# Patient Record
Sex: Female | Born: 1995 | Race: Black or African American | Hispanic: No | Smoking: Current every day smoker
Health system: Southern US, Community
[De-identification: ages and names within clinical notes are randomized; demographics above are authoritative.]

## PROBLEM LIST (undated history)

## (undated) ENCOUNTER — Inpatient Hospital Stay (HOSPITAL_COMMUNITY): Payer: Self-pay

## (undated) DIAGNOSIS — N390 Urinary tract infection, site not specified: Secondary | ICD-10-CM

## (undated) DIAGNOSIS — A599 Trichomoniasis, unspecified: Secondary | ICD-10-CM

## (undated) DIAGNOSIS — A749 Chlamydial infection, unspecified: Secondary | ICD-10-CM

## (undated) HISTORY — PX: DRUG INDUCED ENDOSCOPY: SHX6808

## (undated) HISTORY — PX: NO PAST SURGERIES: SHX2092

## (undated) NOTE — *Deleted (*Deleted)
Patient reports an episode of vaginal bleeding this morning with a small clot in it, states she has not had any other bleeding in the pregnancy.  Denies current abdominal pain though states she had intense pain last week, no bleeding associated with that pain at the time.

---
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
## 2015-06-24 ENCOUNTER — Emergency Department (HOSPITAL_COMMUNITY)
Admission: EM | Admit: 2015-06-24 | Discharge: 2015-06-24 | Disposition: A | Discharge status: Home / Self Care | Payer: Medicaid Other | Attending: Emergency Medicine | Admitting: Emergency Medicine

## 2015-06-24 ENCOUNTER — Emergency Department (HOSPITAL_COMMUNITY)
Admission: EM | Admit: 2015-06-24 | Discharge: 2015-06-24 | Discharge status: Absent without leave | Payer: Medicaid Other | Source: Home / Self Care

## 2015-06-24 ENCOUNTER — Encounter (HOSPITAL_COMMUNITY): Payer: Self-pay | Admitting: Emergency Medicine

## 2015-06-24 DIAGNOSIS — Z87440 Personal history of urinary (tract) infections: Secondary | ICD-10-CM | POA: Diagnosis not present

## 2015-06-24 DIAGNOSIS — R0781 Pleurodynia: Secondary | ICD-10-CM

## 2015-06-24 DIAGNOSIS — Z8619 Personal history of other infectious and parasitic diseases: Secondary | ICD-10-CM | POA: Diagnosis not present

## 2015-06-24 DIAGNOSIS — J069 Acute upper respiratory infection, unspecified: Secondary | ICD-10-CM | POA: Diagnosis not present

## 2015-06-24 DIAGNOSIS — M791 Myalgia, unspecified site: Secondary | ICD-10-CM

## 2015-06-24 DIAGNOSIS — B9789 Other viral agents as the cause of diseases classified elsewhere: Secondary | ICD-10-CM

## 2015-06-24 DIAGNOSIS — R079 Chest pain, unspecified: Secondary | ICD-10-CM | POA: Diagnosis present

## 2015-06-24 MED ORDER — ALBUTEROL SULFATE HFA 108 (90 BASE) MCG/ACT IN AERS
4.0000 | INHALATION_SPRAY | Freq: Once | RESPIRATORY_TRACT | Status: AC
  Administered 2015-06-24: 4 via RESPIRATORY_TRACT
  Filled 2015-06-24: qty 6.7

## 2015-06-24 MED ORDER — SODIUM CHLORIDE 0.9 % IV BOLUS (SEPSIS): 1000.0000 mL | Freq: Once | INTRAVENOUS | Status: DC

## 2015-06-24 MED ORDER — IBUPROFEN 400 MG PO TABS
600.0000 mg | ORAL_TABLET | Freq: Once | ORAL | Status: AC
  Administered 2015-06-24: 600 mg via ORAL
  Filled 2015-06-24 (×2): qty 1

## 2015-06-24 NOTE — ED Provider Notes (Signed)
CSN: [id number]     Arrival date & time 06/24/15  1335 History   None    Chief Complaint  Patient presents with  . Nasal Congestion  . Chest Pain  . Weakness   (Consider location/radiation/quality/duration/timing/severity/associated sxs/prior Treatment) Patient is a 19 y.o. female presenting with URI. The history is provided by the patient. No language interpreter was used.  URI Presenting symptoms: congestion, cough, fatigue and rhinorrhea   Presenting symptoms: no fever and no sore throat   Severity:  Mild Onset quality:  Gradual Timing:  Constant Progression:  Worsening Chronicity:  New Relieved by:  Nothing Worsened by:  Nothing tried Ineffective treatments:  None tried Associated symptoms: myalgias and wheezing   Associated symptoms: no arthralgias, no headaches and no neck pain   Risk factors: not elderly, no chronic cardiac disease, no chronic kidney disease, no chronic respiratory disease, no diabetes mellitus, no recent illness, no recent travel and no sick contacts     Past Medical History  Diagnosis Date  . Trichomonas 01/2012  . Chlamydia infection   . UTI (lower urinary tract infection)    Past Surgical History  Procedure Laterality Date  . No past surgeries     Family History  Problem Relation Age of Onset  . Alcohol abuse Neg Hx   . Arthritis Neg Hx   . Asthma Neg Hx   . Birth defects Neg Hx   . Cancer Neg Hx   . COPD Neg Hx   . Depression Neg Hx   . Drug abuse Neg Hx   . Early death Neg Hx   . Hearing loss Neg Hx   . Heart disease Neg Hx   . Hyperlipidemia Neg Hx   . Hypertension Neg Hx   . Kidney disease Neg Hx   . Learning disabilities Neg Hx   . Mental illness Neg Hx   . Mental retardation Neg Hx   . Miscarriages / Stillbirths Neg Hx   . Stroke Neg Hx   . Vision loss Neg Hx   . Diabetes Maternal Aunt    History  Substance Use Topics  . Smoking status: Never Smoker   . Smokeless tobacco: Never Used  . Alcohol Use: No   OB History     Gravida Para Term Preterm AB TAB SAB Ectopic Multiple Living   0    3     Review of Systems  Constitutional: Positive for chills and fatigue. Negative for fever and diaphoresis.  HENT: Positive for congestion and rhinorrhea. Negative for sore throat.   Respiratory: Positive for cough and wheezing. Negative for chest tightness and shortness of breath.   Cardiovascular: Positive for chest pain.  Gastrointestinal: Negative for nausea, vomiting and abdominal pain.  Musculoskeletal: Positive for myalgias. Negative for arthralgias and neck pain.  Neurological: Negative for weakness, light-headedness and headaches.  Psychiatric/Behavioral: Negative for confusion.  All other systems reviewed and are negative.     Allergies  Review of patient's allergies indicates no known allergies.  Home Medications   Prior to Admission medications   Not on File   BP 111/64 mmHg  Pulse 87  Temp(Src) 98.3 F (36.8 C)  Resp 16  Ht  (1.6 m)  Wt 121 lb (54.885 kg)  BMI 21.44 kg/m2  SpO2 100%  LMP 06/10/2015 Physical Exam  Constitutional: She is oriented to person, place, and time. Vital signs are normal. She appears well-developed and well-nourished. She is active.  Non-toxic appearance. No distress.  HENT:  Head: Normocephalic and atraumatic.  Nose: Nose normal.  Mouth/Throat: Oropharynx is clear and moist. No oropharyngeal exudate.  Eyes: EOM are normal. Pupils are equal, round, and reactive to light.  Neck: Normal range of motion. Neck supple.  Cardiovascular: Normal rate, regular rhythm, normal heart sounds and intact distal pulses.   No murmur heard. Pulmonary/Chest: Effort normal. No respiratory distress. She has wheezes. She exhibits no tenderness.  Recurrent wet sounding cough but not productive of sputum + end expiratory wheezing bilaterally, normal work of breathing, no retractions No reproducible chest tenderness to palpation diffusely   Abdominal: Soft. There is no  tenderness. There is no rebound and no guarding.  Musculoskeletal: Normal range of motion. She exhibits no tenderness.  Lymphadenopathy:    She has no cervical adenopathy.  Neurological: She is alert and oriented to person, place, and time. No cranial nerve deficit. Coordination normal.  Skin: Skin is warm and dry. She is not diaphoretic.  Psychiatric: She has a normal mood and affect. Her behavior is normal. Judgment and thought content normal.  Nursing note and vitals reviewed.   ED Course  Procedures (including critical care time) Labs Review Labs Reviewed - No data to display  Imaging Review No results found.   EKG Interpretation None      MDM   Final diagnoses:  Viral URI with cough  Pleuritic chest pain  Myalgia    Pt is a 19 yo F with no significant PMH who presents with 2 days of URI sx.  Complains of mild diffuse myalgias, cough, pleuritic chest pain with coughing, and intermittent SOB.  Presented today due to the sensation that she couldn't catch her breath.  No hx of asthma or RAD but + family hx of asthma.   No personal or family hx of DVT/PE, no recent immobilization.  Chest pain occurs only with coughing.  Afebrile.  No abdominal sx.  Did not get a flu shot this year.    Looks nontoxic.  Vitals stable on room air.  Mild end expiratory wheezing and wet cough.   Likely viral URI.  Do not believe that labs or imaging are indicated in this young healthy female with stable vitals who looks well.    Will treat with Albuterol and motrin Offered IVF bolus but pt declined.  Encouraged good PO fluid intake at home for the next few days.   Advised on albuterol use PRN and OTC pain/fever medications. Discussed ED return precautions.  All questions were answered and pt was discharged in good condition.     Patient was seen with ED Attending, Dr. Sandi MealyBeaton   Shernita Rabinovich, MD    Lenell AntuJamie Ysabel Cowgill, MD 06/26/15 [id number]  Nelva Nayobert Beaton, MD 07/08/15 [phone]

## 2015-06-24 NOTE — ED Notes (Signed)
Pt. Stated, I started having congestion with a cough and then started having chest pain. This started yesterday.

## 2017-02-03 ENCOUNTER — Emergency Department (HOSPITAL_COMMUNITY)
Admission: EM | Admit: 2017-02-03 | Discharge: 2017-02-03 | Disposition: A | Discharge status: Home / Self Care | Payer: Medicaid Other | Attending: Emergency Medicine | Admitting: Emergency Medicine

## 2017-02-03 ENCOUNTER — Encounter (HOSPITAL_COMMUNITY): Payer: Self-pay | Admitting: Emergency Medicine

## 2017-02-03 ENCOUNTER — Emergency Department (HOSPITAL_COMMUNITY)
Admission: EM | Admit: 2017-02-03 | Discharge: 2017-02-04 | Disposition: A | Discharge status: Home / Self Care | Payer: Medicaid Other | Source: Home / Self Care | Attending: Emergency Medicine | Admitting: Emergency Medicine

## 2017-02-03 DIAGNOSIS — R112 Nausea with vomiting, unspecified: Secondary | ICD-10-CM | POA: Insufficient documentation

## 2017-02-03 DIAGNOSIS — Z5321 Procedure and treatment not carried out due to patient leaving prior to being seen by health care provider: Secondary | ICD-10-CM | POA: Diagnosis not present

## 2017-02-03 DIAGNOSIS — F12988 Cannabis use, unspecified with other cannabis-induced disorder: Secondary | ICD-10-CM

## 2017-02-03 DIAGNOSIS — E860 Dehydration: Secondary | ICD-10-CM

## 2017-02-03 DIAGNOSIS — R1116 Cannabis hyperemesis syndrome: Secondary | ICD-10-CM

## 2017-02-03 DIAGNOSIS — F1290 Cannabis use, unspecified, uncomplicated: Secondary | ICD-10-CM

## 2017-02-03 LAB — URINALYSIS, ROUTINE W REFLEX MICROSCOPIC
BILIRUBIN URINE: NEGATIVE
GLUCOSE, UA: NEGATIVE mg/dL
Ketones, ur: 80 mg/dL — AB
LEUKOCYTES UA: NEGATIVE
NITRITE: NEGATIVE
Protein, ur: 30 mg/dL — AB
SPECIFIC GRAVITY, URINE: 1.024 (ref 1.005–1.030)
pH: 6 (ref 5.0–8.0)

## 2017-02-03 LAB — COMPREHENSIVE METABOLIC PANEL
ALK PHOS: 39 U/L (ref 38–126)
ALT: 35 U/L (ref 14–54)
AST: 40 U/L (ref 15–41)
Albumin: 5.3 g/dL — ABNORMAL HIGH (ref 3.5–5.0)
Anion gap: 17 — ABNORMAL HIGH (ref 5–15)
BILIRUBIN TOTAL: 1 mg/dL (ref 0.3–1.2)
BUN: 13 mg/dL (ref 6–20)
CALCIUM: 10.1 mg/dL (ref 8.9–10.3)
CO2: 19 mmol/L — ABNORMAL LOW (ref 22–32)
CREATININE: 0.78 mg/dL (ref 0.44–1.00)
Chloride: 107 mmol/L (ref 101–111)
GFR calc Af Amer: >60 mL/min (ref >60)
Glucose, Bld: 96 mg/dL (ref 65–99)
Potassium: 3.9 mmol/L (ref 3.5–5.1)
Sodium: 143 mmol/L (ref 135–145)
TOTAL PROTEIN: 7.9 g/dL (ref 6.5–8.1)

## 2017-02-03 LAB — CBC
HCT: 39 % (ref 36.0–46.0)
Hemoglobin: 14 g/dL (ref 12.0–15.0)
MCH: 27.4 pg (ref 26.0–34.0)
MCHC: 35.9 g/dL (ref 30.0–36.0)
MCV: 76.3 fL — ABNORMAL LOW (ref 78.0–100.0)
PLATELETS: 218 10*3/uL (ref 150–400)
RBC: 5.11 MIL/uL (ref 3.87–5.11)
RDW: 13.9 % (ref 11.5–15.5)
WBC: 30.5 10*3/uL — ABNORMAL HIGH (ref 4.0–10.5)

## 2017-02-03 LAB — I-STAT BETA HCG BLOOD, ED (MC, WL, AP ONLY): I-stat hCG, quantitative: <5 m[IU]/mL (ref <5)

## 2017-02-03 LAB — LIPASE, BLOOD: Lipase: 12 U/L (ref 11–51)

## 2017-02-03 MED ORDER — SODIUM CHLORIDE 0.9 % IV BOLUS (SEPSIS)
1000.0000 mL | Freq: Once | INTRAVENOUS | Status: AC
  Administered 2017-02-03: 1000 mL via INTRAVENOUS

## 2017-02-03 MED ORDER — ONDANSETRON 4 MG PO TBDP
4.0000 mg | ORAL_TABLET | Freq: Once | ORAL | Status: AC | PRN
  Administered 2017-02-03: 4 mg via ORAL

## 2017-02-03 MED ORDER — ONDANSETRON HCL 4 MG/2ML IJ SOLN
4.0000 mg | Freq: Once | INTRAMUSCULAR | Status: AC
  Administered 2017-02-03: 4 mg via INTRAVENOUS
  Filled 2017-02-03: qty 2

## 2017-02-03 MED ORDER — ONDANSETRON 4 MG PO TBDP
ORAL_TABLET | ORAL | Status: AC
  Filled 2017-02-03: qty 1

## 2017-02-03 NOTE — ED Notes (Signed)
Pt already had zofran administered and labs drawn [facility], EKG order initiated and pt updated on plan of care.

## 2017-02-03 NOTE — ED Provider Notes (Signed)
[facility]-EMERGENCY DEPT Provider Note   CSN: [id number] Arrival date & time: 02/03/17  2116  By signing my name below, I, Elder Negus, attest that this documentation has been prepared under the direction and in the presence of Shon Baton, MD. Electronically Signed: Elder Negus, Scribe. 02/04/17. 12:00 AM.   History   Chief Complaint Chief Complaint  Patient presents with  . Emesis    HPI [name] Weber is a 21 y.o. female who presents to the ED for evaluation of persistent vomiting since 0800 this morning. At interview, the patient is also reporting abdominal pain, chest pain, back pain, and dyspnea over the same time period which she feels has been exacerbated by her vomiting. The patient is a frequent marijuana smoker and does report episodes of vomiting in the past, however none of this severity or duration. She denies any fevers or cough. Denies diarrhea. Patient is on birth control. LMP was approximately 2 weeks ago.   The history is provided by the patient. No language interpreter was used.    Past Medical History:  Diagnosis Date  . Chlamydia infection   . Trichomonas 01/2012  . UTI (lower urinary tract infection)     Patient Active Problem List   Diagnosis Date Noted  . NSVD (normal spontaneous vaginal delivery) 10/08/2014  . Active labor 10/06/2014  . Group B streptococcus urinary tract infection affecting pregnancy in second trimester, antepartum 07/07/2014  . Short interval between pregnancies affecting pregnancy in second trimester, antepartum 07/03/2014  . Sickle cell trait (HCC) 07/03/2014  . Chlamydia infection complicating pregnancy, antepartum 07/27/2013  . Young maternal age, antepartum 07/27/2013    Past Surgical History:  Procedure Laterality Date  . NO PAST SURGERIES      OB History    Gravida Para Term Preterm AB Living   4 3 3   1 3    SAB TAB Ectopic Multiple Live Births     0     3       Home Medications    Prior to  Admission medications   Medication Sig Start Date End Date Taking? Authorizing Provider  ondansetron (ZOFRAN ODT) 4 MG disintegrating tablet Take 1 tablet (4 mg total) by mouth every 8 (eight) hours as needed for nausea or vomiting. 02/04/17   Shon Baton, MD    Family History Family History  Problem Relation Age of Onset  . Alcohol abuse Neg Hx   . Arthritis Neg Hx   . Asthma Neg Hx   . Birth defects Neg Hx   . Cancer Neg Hx   . COPD Neg Hx   . Depression Neg Hx   . Drug abuse Neg Hx   . Early death Neg Hx   . Hearing loss Neg Hx   . Heart disease Neg Hx   . Hyperlipidemia Neg Hx   . Hypertension Neg Hx   . Kidney disease Neg Hx   . Learning disabilities Neg Hx   . Mental illness Neg Hx   . Mental retardation Neg Hx   . Miscarriages / Stillbirths Neg Hx   . Stroke Neg Hx   . Vision loss Neg Hx   . Diabetes Maternal Aunt     Social History Social History  Substance Use Topics  . Smoking status: Never Smoker  . Smokeless tobacco: Never Used  . Alcohol use No     Allergies   Patient has no known allergies.   Review of Systems Review of Systems  Constitutional: Negative  for fever.  Respiratory: Positive for chest tightness and shortness of breath. Negative for cough.   Gastrointestinal: Positive for nausea and vomiting. Negative for abdominal pain and diarrhea.  Musculoskeletal: Positive for back pain.  All other systems reviewed and are negative.    Physical Exam Updated Vital Signs BP (!) 107/54 (BP Location: Left Arm)   Pulse 85   Temp 98.9 F (37.2 C) (Oral)   Resp 19   Ht 5\' 3"  (1.6 m)   Wt 103 lb 12.8 oz (47.1 kg)   SpO2 100%   BMI 18.39 kg/m   Physical Exam  Constitutional: She is oriented to person, place, and time. No distress.  Thin, smells of marijuana  HENT:  Head: Normocephalic and atraumatic.  Mucous membranes dry  Eyes: Pupils are equal, round, and reactive to light.  Neck: Neck supple.  Cardiovascular: Normal rate, regular  rhythm and normal heart sounds.   No murmur heard. Pulmonary/Chest: Effort normal and breath sounds normal. No respiratory distress. She has no wheezes.  Abdominal: Soft. Bowel sounds are normal. She exhibits no mass. There is tenderness. There is no guarding.  Epigastric tenderness to palpation without rebound or guarding  Musculoskeletal: She exhibits no edema.  Neurological: She is alert and oriented to person, place, and time.  Skin: Skin is warm and dry.  Psychiatric: She has a normal mood and affect.  Nursing note and vitals reviewed.    ED Treatments / Results  DIAGNOSTIC STUDIES: Oxygen Saturation is 99 percent on room air which is normal by my interpretation.    COORDINATION OF CARE: 11:45 PM Discussed treatment plan with pt at bedside and pt agreed to plan.  Labs (all labs ordered are listed, but only abnormal results are displayed) Labs Reviewed  URINALYSIS, ROUTINE W REFLEX MICROSCOPIC - Abnormal; Notable for the following:       Result Value   APPearance HAZY (*)    Hgb urine dipstick SMALL (*)    Ketones, ur 80 (*)    Protein, ur 100 (*)    Squamous Epithelial / LPF 6-30 (*)    All other components within normal limits  CBC WITH DIFFERENTIAL/PLATELET - Abnormal; Notable for the following:    WBC 23.8 (*)    RBC 5.20 (*)    MCV 75.0 (*)    MCHC 36.4 (*)    Neutro Abs 21.5 (*)    All other components within normal limits  COMPREHENSIVE METABOLIC PANEL - Abnormal; Notable for the following:    Glucose, Bld 119 (*)    Total Protein 9.0 (*)    Albumin 5.6 (*)    AST 46 (*)    All other components within normal limits  LIPASE, BLOOD  PREGNANCY, URINE    EKG  EKG Interpretation  Date/Time:  Tuesday February 03 2017 21:55:39 EST Ventricular Rate:  77 PR Interval:    QRS Duration: 80 QT Interval:  467 QTC Calculation: 529 R Axis:   85 Text Interpretation:  Normal sinus rhythm Nonspecific T wave abnormality Prolonged QT interval Confirmed by Wilkie AyeHORTON  MD,  COURTNEY ([id number]) on 02/04/2017 12:53:39 AM       Radiology Ct Abdomen Pelvis W Contrast  Result Date: 02/04/2017 CLINICAL DATA:  Nausea, vomiting for several hours, dysuria, abdominal pain, back pain. Leukocytosis. History of urinary tract infection and sexually transmitted disease. EXAM: CT ABDOMEN AND PELVIS WITH CONTRAST TECHNIQUE: Multidetector CT imaging of the abdomen and pelvis was performed using the standard protocol following bolus administration of intravenous contrast. CONTRAST:  80 cc Isovue-300 COMPARISON:  CT abdomen and pelvis February 06, 2010 FINDINGS: LOWER CHEST: Lung bases are clear. Included heart size is normal. No pericardial effusion. HEPATOBILIARY: Liver and gallbladder are normal. PANCREAS: Normal. SPLEEN: Normal. ADRENALS/URINARY TRACT: Kidneys are orthotopic, demonstrating symmetric enhancement. No nephrolithiasis, hydronephrosis or solid renal masses. The unopacified ureters are normal in course and caliber. Urinary bladder is partially distended and unremarkable. Identifiable portions of the appendix are normal. STOMACH/BOWEL: The stomach, small and large bowel are normal in course and caliber without inflammatory changes. Normal appendix. VASCULAR/LYMPHATIC: Aortoiliac vessels are normal in course and caliber. No lymphadenopathy by CT size criteria. REPRODUCTIVE: Normal. OTHER: No intraperitoneal free fluid or free air. MUSCULOSKELETAL: Nonacute. IMPRESSION: No acute intra-abdominal or pelvic process. Electronically Signed   By: Awilda Metro M.D.   On: 02/04/2017 04:19    Procedures Procedures (including critical care time)  Medications Ordered in ED Medications  sodium chloride 0.9 % injection (not administered)  ondansetron (ZOFRAN) injection 4 mg (4 mg Intravenous Given 02/03/17 2349)  sodium chloride 0.9 % bolus 1,000 mL (0 mLs Intravenous Stopped 02/04/17 0134)  sodium chloride 0.9 % bolus 1,000 mL (0 mLs Intravenous Stopped 02/04/17 0358)  pantoprazole  (PROTONIX) injection 40 mg (40 mg Intravenous Given 02/04/17 0134)  iopamidol (ISOVUE-300) 61 % injection 30 mL (30 mLs Oral Contrast Given 02/04/17 0211)  iopamidol (ISOVUE-300) 61 % injection 100 mL (80 mLs Intravenous Contrast Given 02/04/17 0402)     Initial Impression / Assessment and Plan / ED Course  I have reviewed the triage vital signs and the nursing notes.  Pertinent labs & imaging results that were available during my care of the patient were reviewed by me and considered in my medical decision making (see chart for details).     Patient presents with vomiting 12 hours. She reports daily marijuana use. She also reports abdominal chest pain that is worse with vomiting. She has epigastric tenderness on exam and I suspect acute gastritis. Patient was given pain and nausea medication as well as Protonix. Lab work obtained. Lab work is notable for significant leukocytosis. Patient overall does appear hemoconcentrated but she had a Gintz count of 30. For this reason, further imaging was pursued.  She was given 2 L of fluid and Zofran. She is tolerating oral contrast without difficulty. CT scan shows no significant abnormalities.  Suspect acute vomiting likely secondary to hyperemesis from cannabinoid use. I have advised the patient that she should discontinue using. She will be sent home with Zofran. She is tolerating fluids at discharge.  After history, exam, and medical workup I feel the patient has been appropriately medically screened and is safe for discharge home. Pertinent diagnoses were discussed with the patient. Patient was given return precautions.   Final Clinical Impressions(s) / ED Diagnoses   Final diagnoses:  Nausea and vomiting, intractability of vomiting not specified, unspecified vomiting type  Cannabinoid hyperemesis syndrome (HCC)  Dehydration    New Prescriptions New Prescriptions   ONDANSETRON (ZOFRAN ODT) 4 MG DISINTEGRATING TABLET    Take 1 tablet (4 mg  total) by mouth every 8 (eight) hours as needed for nausea or vomiting.   I personally performed the services described in this documentation, which was scribed in my presence. The recorded information has been reviewed and is accurate.    Shon Baton, MD 02/04/17 [phone]

## 2017-02-03 NOTE — ED Triage Notes (Signed)
Pt reports she went to [place] tonight for vomiting that started this morning. She left d/t wait, states that she has had onset of chest pain and back pain since then.

## 2017-02-03 NOTE — ED Triage Notes (Signed)
Pt sts N/V starting today

## 2017-02-03 NOTE — ED Notes (Signed)
Pt requesting ice chips. Verbalized understanding about waiting to be evaluated first.

## 2017-02-03 NOTE — ED Notes (Signed)
Pt requesting to see the doctor. This RN informed her that the EDP will sign up and see her as soon as they are able.

## 2017-02-03 NOTE — ED Notes (Signed)
Pt called for vital sign reassessment no answer x3

## 2017-02-04 ENCOUNTER — Emergency Department (HOSPITAL_COMMUNITY): Payer: Medicaid Other

## 2017-02-04 ENCOUNTER — Encounter (HOSPITAL_COMMUNITY): Payer: Self-pay

## 2017-02-04 LAB — URINALYSIS, ROUTINE W REFLEX MICROSCOPIC
Bacteria, UA: NONE SEEN
Bilirubin Urine: NEGATIVE
GLUCOSE, UA: NEGATIVE mg/dL
Ketones, ur: 80 mg/dL — AB
Leukocytes, UA: NEGATIVE
NITRITE: NEGATIVE
PROTEIN: 100 mg/dL — AB
Specific Gravity, Urine: 1.029 (ref 1.005–1.030)
pH: 6 (ref 5.0–8.0)

## 2017-02-04 LAB — CBC WITH DIFFERENTIAL/PLATELET
BASOS ABS: 0 10*3/uL (ref 0.0–0.1)
BASOS PCT: 0 %
Eosinophils Absolute: 0 10*3/uL (ref 0.0–0.7)
Eosinophils Relative: 0 %
HEMATOCRIT: 39 % (ref 36.0–46.0)
HEMOGLOBIN: 14.2 g/dL (ref 12.0–15.0)
LYMPHS PCT: 6 %
Lymphs Abs: 1.3 10*3/uL (ref 0.7–4.0)
MCH: 27.3 pg (ref 26.0–34.0)
MCHC: 36.4 g/dL — ABNORMAL HIGH (ref 30.0–36.0)
MCV: 75 fL — AB (ref 78.0–100.0)
MONO ABS: 1 10*3/uL (ref 0.1–1.0)
Monocytes Relative: 4 %
NEUTROS ABS: 21.5 10*3/uL — AB (ref 1.7–7.7)
NEUTROS PCT: 90 %
Platelets: 211 10*3/uL (ref 150–400)
RBC: 5.2 MIL/uL — AB (ref 3.87–5.11)
RDW: 13.9 % (ref 11.5–15.5)
WBC: 23.8 10*3/uL — AB (ref 4.0–10.5)

## 2017-02-04 LAB — COMPREHENSIVE METABOLIC PANEL
ALT: 41 U/L (ref 14–54)
AST: 46 U/L — AB (ref 15–41)
Albumin: 5.6 g/dL — ABNORMAL HIGH (ref 3.5–5.0)
Alkaline Phosphatase: 43 U/L (ref 38–126)
Anion gap: 12 (ref 5–15)
BILIRUBIN TOTAL: 1 mg/dL (ref 0.3–1.2)
BUN: 17 mg/dL (ref 6–20)
CO2: 23 mmol/L (ref 22–32)
CREATININE: 0.95 mg/dL (ref 0.44–1.00)
Calcium: 10 mg/dL (ref 8.9–10.3)
Chloride: 106 mmol/L (ref 101–111)
GFR calc Af Amer: >60 mL/min (ref >60)
Glucose, Bld: 119 mg/dL — ABNORMAL HIGH (ref 65–99)
POTASSIUM: 3.9 mmol/L (ref 3.5–5.1)
Sodium: 141 mmol/L (ref 135–145)
TOTAL PROTEIN: 9 g/dL — AB (ref 6.5–8.1)

## 2017-02-04 LAB — PREGNANCY, URINE: Preg Test, Ur: NEGATIVE

## 2017-02-04 LAB — LIPASE, BLOOD: LIPASE: 13 U/L (ref 11–51)

## 2017-02-04 MED ORDER — PANTOPRAZOLE SODIUM 40 MG IV SOLR
40.0000 mg | Freq: Once | INTRAVENOUS | Status: AC
  Administered 2017-02-04: 40 mg via INTRAVENOUS
  Filled 2017-02-04: qty 40

## 2017-02-04 MED ORDER — SODIUM CHLORIDE 0.9 % IJ SOLN
INTRAMUSCULAR | Status: AC
  Filled 2017-02-04: qty 50

## 2017-02-04 MED ORDER — IOPAMIDOL (ISOVUE-300) INJECTION 61%
30.0000 mL | Freq: Once | INTRAVENOUS | Status: AC | PRN
  Administered 2017-02-04: 30 mL via ORAL

## 2017-02-04 MED ORDER — IOPAMIDOL (ISOVUE-300) INJECTION 61%
INTRAVENOUS | Status: AC
  Administered 2017-02-04: 80 mL via INTRAVENOUS
  Filled 2017-02-04: qty 100

## 2017-02-04 MED ORDER — SODIUM CHLORIDE 0.9 % IV BOLUS (SEPSIS)
1000.0000 mL | Freq: Once | INTRAVENOUS | Status: AC
  Administered 2017-02-04: 1000 mL via INTRAVENOUS

## 2017-02-04 MED ORDER — IOPAMIDOL (ISOVUE-300) INJECTION 61%
INTRAVENOUS | Status: AC
  Administered 2017-02-04: 30 mL via ORAL
  Filled 2017-02-04: qty 30

## 2017-02-04 MED ORDER — IOPAMIDOL (ISOVUE-300) INJECTION 61%
100.0000 mL | Freq: Once | INTRAVENOUS | Status: AC | PRN
  Administered 2017-02-04: 80 mL via INTRAVENOUS

## 2017-02-04 MED ORDER — ONDANSETRON 4 MG PO TBDP: 4.0000 mg | ORAL_TABLET | Freq: Three times a day (TID) | ORAL | 0 refills | Status: DC | PRN

## 2017-02-04 NOTE — ED Notes (Signed)
Pt was given ginger ale---- tolerated well.

## 2017-02-04 NOTE — Discharge Instructions (Signed)
You were seen today for vomiting. Your workup shows that you were severely dehydrated. The cause of your vomiting is unclear at this time but may be related to excessive marijuana use. You will be given Zofran. Try to abstain from excessive marijuana use.

## 2018-05-08 ENCOUNTER — Encounter (HOSPITAL_COMMUNITY): Payer: Self-pay | Admitting: Emergency Medicine

## 2018-05-08 ENCOUNTER — Other Ambulatory Visit: Payer: Self-pay

## 2018-05-08 ENCOUNTER — Emergency Department (HOSPITAL_COMMUNITY)
Admission: EM | Admit: 2018-05-08 | Discharge: 2018-05-08 | Disposition: A | Discharge status: Home / Self Care | Payer: Medicaid Other | Attending: Emergency Medicine | Admitting: Emergency Medicine

## 2018-05-08 DIAGNOSIS — F1210 Cannabis abuse, uncomplicated: Secondary | ICD-10-CM | POA: Insufficient documentation

## 2018-05-08 DIAGNOSIS — R1084 Generalized abdominal pain: Secondary | ICD-10-CM | POA: Insufficient documentation

## 2018-05-08 DIAGNOSIS — R1110 Vomiting, unspecified: Secondary | ICD-10-CM

## 2018-05-08 DIAGNOSIS — R112 Nausea with vomiting, unspecified: Secondary | ICD-10-CM | POA: Insufficient documentation

## 2018-05-08 LAB — URINALYSIS, ROUTINE W REFLEX MICROSCOPIC
BILIRUBIN URINE: NEGATIVE
Glucose, UA: NEGATIVE mg/dL
Ketones, ur: 80 mg/dL — AB
NITRITE: NEGATIVE
PROTEIN: 100 mg/dL — AB
Specific Gravity, Urine: 1.023 (ref 1.005–1.030)
pH: 7 (ref 5.0–8.0)

## 2018-05-08 LAB — LIPASE, BLOOD: Lipase: 20 U/L (ref 11–51)

## 2018-05-08 LAB — COMPREHENSIVE METABOLIC PANEL
ALBUMIN: 4.7 g/dL (ref 3.5–5.0)
ALT: 20 U/L (ref 14–54)
ANION GAP: 14 (ref 5–15)
AST: 32 U/L (ref 15–41)
Alkaline Phosphatase: 35 U/L — ABNORMAL LOW (ref 38–126)
BILIRUBIN TOTAL: 1.1 mg/dL (ref 0.3–1.2)
BUN: 11 mg/dL (ref 6–20)
CHLORIDE: 104 mmol/L (ref 101–111)
CO2: 20 mmol/L — AB (ref 22–32)
Calcium: 9.2 mg/dL (ref 8.9–10.3)
Creatinine, Ser: 0.74 mg/dL (ref 0.44–1.00)
GFR calc Af Amer: >60 mL/min (ref >60)
GFR calc non Af Amer: >60 mL/min (ref >60)
GLUCOSE: 89 mg/dL (ref 65–99)
POTASSIUM: 4.1 mmol/L (ref 3.5–5.1)
SODIUM: 138 mmol/L (ref 135–145)
TOTAL PROTEIN: 7.6 g/dL (ref 6.5–8.1)

## 2018-05-08 LAB — CBC WITH DIFFERENTIAL/PLATELET
BASOS ABS: 0 10*3/uL (ref 0.0–0.1)
BASOS PCT: 0 %
Eosinophils Absolute: 0 10*3/uL (ref 0.0–0.7)
Eosinophils Relative: 0 %
HEMATOCRIT: 34.5 % — AB (ref 36.0–46.0)
Hemoglobin: 12.4 g/dL (ref 12.0–15.0)
LYMPHS PCT: 17 %
Lymphs Abs: 2 10*3/uL (ref 0.7–4.0)
MCH: 28.6 pg (ref 26.0–34.0)
MCHC: 35.9 g/dL (ref 30.0–36.0)
MCV: 79.7 fL (ref 78.0–100.0)
MONOS PCT: 5 %
Monocytes Absolute: 0.6 10*3/uL (ref 0.1–1.0)
NEUTROS ABS: 9.3 10*3/uL — AB (ref 1.7–7.7)
NEUTROS PCT: 78 %
Platelets: 205 10*3/uL (ref 150–400)
RBC: 4.33 MIL/uL (ref 3.87–5.11)
RDW: 13.9 % (ref 11.5–15.5)
WBC: 11.9 10*3/uL — ABNORMAL HIGH (ref 4.0–10.5)

## 2018-05-08 LAB — I-STAT BETA HCG BLOOD, ED (MC, WL, AP ONLY)

## 2018-05-08 MED ORDER — HALOPERIDOL LACTATE 5 MG/ML IJ SOLN
5.0000 mg | Freq: Once | INTRAMUSCULAR | Status: AC
  Administered 2018-05-08: 5 mg via INTRAVENOUS
  Filled 2018-05-08: qty 1

## 2018-05-08 MED ORDER — SODIUM CHLORIDE 0.9 % IV BOLUS
1000.0000 mL | Freq: Once | INTRAVENOUS | Status: AC
  Administered 2018-05-08: 1000 mL via INTRAVENOUS

## 2018-05-08 MED ORDER — ACETAMINOPHEN 325 MG PO TABS
650.0000 mg | ORAL_TABLET | Freq: Once | ORAL | Status: AC
  Administered 2018-05-08: 650 mg via ORAL
  Filled 2018-05-08: qty 2

## 2018-05-08 MED ORDER — ONDANSETRON 4 MG PO TBDP: 4.0000 mg | ORAL_TABLET | Freq: Three times a day (TID) | ORAL | 0 refills | Status: DC | PRN

## 2018-05-08 NOTE — Discharge Instructions (Signed)
It is important to stop using marijuana as this could be causing the vomiting and abdominal pain or experiencing today.  If your abdominal pain worsens, does not improve, or he develop any other new/concerning symptoms such as fever, return to the ER for evaluation.  Otherwise, follow-up with your doctor if not improving.

## 2018-05-08 NOTE — ED Provider Notes (Signed)
[facility]-EMERGENCY DEPT Provider Note   CSN: [id number] Arrival date & time: 05/08/18  1934     History   Chief Complaint Chief Complaint  Patient presents with  . Abdominal Pain    HPI [name] Weber is a 22 y.o. female.  HPI  22 year old female presents with vomiting and abdominal pain.  Started this morning when she woke up around 10 AM.  She is been having multiple episodes of emesis as well as diffuse/generalized abdominal pain.  It feels like a cramping/knot-like sensation.  No diarrhea, fever, back pain, urinary symptoms.  She states she drank a sizable amount of alcohol last night as well as smoked marijuana and had ecstasy.  She uses marijuana multiple times per week.  She states this is happened to her once before after a heavy night of drinking and doing drugs.  LMP 1 month ago and she has irregular cycles.  Started spotting tonight.  Past Medical History:  Diagnosis Date  . Chlamydia infection   . Trichomonas 01/2012  . UTI (lower urinary tract infection)     Patient Active Problem List   Diagnosis Date Noted  . NSVD (normal spontaneous vaginal delivery) 10/08/2014  . Active labor 10/06/2014  . Group B streptococcus urinary tract infection affecting pregnancy in second trimester, antepartum 07/07/2014  . Short interval between pregnancies affecting pregnancy in second trimester, antepartum 07/03/2014  . Sickle cell trait (HCC) 07/03/2014  . Chlamydia infection complicating pregnancy, antepartum 07/27/2013  . Young maternal age, antepartum 07/27/2013    Past Surgical History:  Procedure Laterality Date  . NO PAST SURGERIES       OB History    Gravida  4   Para  3   Term  3   Preterm      AB  1   Living  3     SAB      TAB  0   Ectopic      Multiple      Live Births  3            Home Medications    Prior to Admission medications   Medication Sig Start Date End Date Taking? Authorizing Provider    ondansetron (ZOFRAN ODT) 4 MG disintegrating tablet Take 1 tablet (4 mg total) by mouth every 8 (eight) hours as needed for nausea or vomiting. 05/08/18   Pricilla Loveless, MD    Family History Family History  Problem Relation Age of Onset  . Diabetes Maternal Aunt   . Alcohol abuse Neg Hx   . Arthritis Neg Hx   . Asthma Neg Hx   . Birth defects Neg Hx   . Cancer Neg Hx   . COPD Neg Hx   . Depression Neg Hx   . Drug abuse Neg Hx   . Early death Neg Hx   . Hearing loss Neg Hx   . Heart disease Neg Hx   . Hyperlipidemia Neg Hx   . Hypertension Neg Hx   . Kidney disease Neg Hx   . Learning disabilities Neg Hx   . Mental illness Neg Hx   . Mental retardation Neg Hx   . Miscarriages / Stillbirths Neg Hx   . Stroke Neg Hx   . Vision loss Neg Hx     Social History Social History   Tobacco Use  . Smoking status: Never Smoker  . Smokeless tobacco: Never Used  Substance Use Topics  . Alcohol use: No  . Drug use:  No     Allergies   Patient has no known allergies.   Review of Systems Review of Systems  Constitutional: Negative for fever.  Cardiovascular: Negative for chest pain.  Gastrointestinal: Positive for abdominal pain, nausea and vomiting.  Genitourinary: Positive for vaginal bleeding (spotting starting today). Negative for dysuria.  Musculoskeletal: Negative for back pain.  All other systems reviewed and are negative.    Physical Exam Updated Vital Signs BP 118/81 (BP Location: Right Arm)   Pulse 86   Temp 97.6 F (36.4 C) (Oral)   Resp 15   Ht  (1.6 m)   Wt 52.2 kg (115 lb)   LMP  (LMP Unknown) Comment: beginning of April  SpO2 100%   BMI 20.37 kg/m   Physical Exam  Constitutional: She is oriented to person, place, and time. She appears well-developed and well-nourished.  Non-toxic appearance. She does not appear ill. No distress.  HENT:  Head: Normocephalic and atraumatic.  Right Ear: External ear normal.  Left Ear: External ear normal.   Nose: Nose normal.  Eyes: Right eye exhibits no discharge. Left eye exhibits no discharge.  Cardiovascular: Normal rate, regular rhythm and normal heart sounds.  Pulmonary/Chest: Effort normal and breath sounds normal.  Abdominal: Soft. She exhibits no distension. There is generalized tenderness (mild).  Neurological: She is alert and oriented to person, place, and time.  Skin: Skin is warm and dry.  Nursing note and vitals reviewed.    ED Treatments / Results  Labs (all labs ordered are listed, but only abnormal results are displayed) Labs Reviewed  COMPREHENSIVE METABOLIC PANEL - Abnormal; Notable for the following components:      Result Value   CO2 20 (*)    Alkaline Phosphatase 35 (*)    All other components within normal limits  CBC WITH DIFFERENTIAL/PLATELET - Abnormal; Notable for the following components:   WBC 11.9 (*)    HCT 34.5 (*)    Neutro Abs 9.3 (*)    All other components within normal limits  URINALYSIS, ROUTINE W REFLEX MICROSCOPIC - Abnormal; Notable for the following components:   APPearance HAZY (*)    Hgb urine dipstick LARGE (*)    Ketones, ur 80 (*)    Protein, ur 100 (*)    Leukocytes, UA TRACE (*)    Bacteria, UA RARE (*)    All other components within normal limits  LIPASE, BLOOD  I-STAT BETA HCG BLOOD, ED ([facility], [facility], [facility] ONLY)    EKG EKG Interpretation  Date/Time:  Saturday May 08 2018 20:39:23 EDT Ventricular Rate:  72 PR Interval:    QRS Duration: 92 QT Interval:  408 QTC Calculation: 447 R Axis:   81 Text Interpretation:  Sinus rhythm Prolonged PR interval Nonspecific T abnrm, anterolateral leads no significant change since 2018 Confirmed by Pricilla Loveless [phone]) on 05/08/2018 8:53:46 PM   Radiology No results found.  Procedures Procedures (including critical care time)  Medications Ordered in ED Medications  sodium chloride 0.9 % bolus 1,000 mL (0 mLs Intravenous Stopped 05/08/18 2226)  haloperidol lactate (HALDOL) injection 5  mg (5 mg Intravenous Given 05/08/18 2130)  acetaminophen (TYLENOL) tablet 650 mg (650 mg Oral Given 05/08/18 2333)     Initial Impression / Assessment and Plan / ED Course  I have reviewed the triage vital signs and the nursing notes.  Pertinent labs & imaging results that were available during my care of the patient were reviewed by me and considered in my medical decision making (  see chart for details).     Patient's abdominal pain and vomiting is likely related to the recent alcohol and marijuana use.  She uses marijuana multiple times per week and this could be hyperemesis.  However she has not had any vomiting since being treated with Haldol and IV fluids.  She is feeling better.  She still complains of a little bit of abdominal pain will be given Tylenol.  She had no focal tenderness and my suspicion for an acute intra-abdominal emergency is low.  I do not think CT would be warranted at this time.  However we did discuss that if her symptoms do not improve or start to worsen or localize, she should come back for reevaluation.  She has no urinary symptoms and urine does not show an obvious UTI and is contaminated with squamous epithelial cells.  She is tolerating oral fluids.  She is not pregnant and thus with an otherwise negative work-up I think she is stable for discharge home.  I counseled her on stopping marijuana as well as need for PCP follow-up.  Final Clinical Impressions(s) / ED Diagnoses   Final diagnoses:  Generalized abdominal pain  Vomiting in adult    ED Discharge Orders        Ordered    ondansetron (ZOFRAN ODT) 4 MG disintegrating tablet  Every 8 hours PRN     05/08/18 2328       Pricilla Loveless, MD 05/08/18 2351

## 2018-05-08 NOTE — ED Notes (Signed)
Patient ambulatory to restroom with steady gait.

## 2018-05-08 NOTE — ED Triage Notes (Signed)
Patient BIB [facility] from home for n/v abdominal pain. Pt reports taking ecstasy and drinking alcohol all night. Pt states she stopped drinking around 0900 and has had no po intake since. Since then pt has had generalized abdominal pain with nausea and vomiting. States it has gradually gotten worse, sharp pain, tender upon palpation.  4 mg zofran given by ems as well as 300 ml NS.

## 2018-09-01 ENCOUNTER — Encounter (HOSPITAL_COMMUNITY): Payer: Self-pay | Admitting: Emergency Medicine

## 2018-09-01 ENCOUNTER — Other Ambulatory Visit: Payer: Self-pay

## 2018-09-01 ENCOUNTER — Emergency Department (HOSPITAL_COMMUNITY)
Admission: EM | Admit: 2018-09-01 | Discharge: 2018-09-01 | Disposition: A | Discharge status: Home / Self Care | Payer: Medicaid Other | Attending: Emergency Medicine | Admitting: Emergency Medicine

## 2018-09-01 DIAGNOSIS — D573 Sickle-cell trait: Secondary | ICD-10-CM | POA: Diagnosis not present

## 2018-09-01 DIAGNOSIS — J029 Acute pharyngitis, unspecified: Secondary | ICD-10-CM

## 2018-09-01 DIAGNOSIS — J069 Acute upper respiratory infection, unspecified: Secondary | ICD-10-CM | POA: Diagnosis not present

## 2018-09-01 DIAGNOSIS — R0981 Nasal congestion: Secondary | ICD-10-CM | POA: Diagnosis present

## 2018-09-01 DIAGNOSIS — B9789 Other viral agents as the cause of diseases classified elsewhere: Secondary | ICD-10-CM

## 2018-09-01 LAB — GROUP A STREP BY PCR: GROUP A STREP BY PCR: NOT DETECTED

## 2018-09-01 MED ORDER — BENZONATATE 100 MG PO CAPS: 100.0000 mg | ORAL_CAPSULE | Freq: Three times a day (TID) | ORAL | 0 refills | Status: DC

## 2018-09-01 MED ORDER — GUAIFENESIN ER 1200 MG PO TB12: 1.0000 | ORAL_TABLET | Freq: Two times a day (BID) | ORAL | 0 refills | Status: DC | PRN

## 2018-09-01 MED ORDER — IBUPROFEN 400 MG PO TABS
600.0000 mg | ORAL_TABLET | Freq: Once | ORAL | Status: AC
  Administered 2018-09-01: 600 mg via ORAL
  Filled 2018-09-01: qty 1

## 2018-09-01 NOTE — ED Notes (Signed)
Patient verbalizes understanding of discharge instructions. Opportunity for questioning and answers were provided. Armband removed by staff, pt discharged from ED.  

## 2018-09-01 NOTE — Discharge Instructions (Signed)
Please read and follow all provided instructions.  Your diagnoses today include: Viral Pharyngitis   Tests performed today include: Vital signs. See below for your results today.  Strep Test: This was negative. A throat culture was sent as a precaution and results will be available in 2-3 days. If it returns positive for strep, you will be called by our flow manager for further instructions.  Home care instructions:  At this time, it appears that your sore throat is caused by a viral infection. Antibiotics do NOT help a viral infection and can cause unwanted side effects. The fever should resolve in 2-3 days and sore throat should begin to resolve in 2-3 days as well. Continued to alternate between Tylenol and ibuprofen for pain. May consider over-the-counter Benadryl for additional relief (decrease secretions). Also discard your toothbrush and begin using a new one in 3 days.   Follow-up instructions: Please follow-up with your primary care provider in 2-3 days for follow up.   Return instructions:  Return to the ED sooner for worsening condition, inability to swallow, breathing difficulty, new concerns.  Additional Information:  Your vital signs today were: BP 102/60    Pulse 72    Temp 98.5 F (36.9 C) (Oral)    Resp 14    Ht 5\' 3"  (1.6 m)    Wt 47.6 kg    LMP 08/18/2018    SpO2 99%    BMI 18.60 kg/m  If your blood pressure (BP) was elevated above 135/85 this visit, please have this repeated by your doctor within one month. ---------------

## 2018-09-01 NOTE — ED Triage Notes (Signed)
Pt reports nasal drainage, cough, headache, and sore throat. Pt denies fever.

## 2018-09-01 NOTE — ED Provider Notes (Signed)
Patient placed in Quick Look pathway, seen and evaluated   Chief Complaint: cold like symptoms.  HPI:  [name] Weber is a 22 y.o. female who presents to the ED for cough, congestion, sore throat, aching all over. The symptoms started last night. Patient reports that her son has had similar symptoms.   ROS: Resp: cough, congestion  ENT: sore throat, nasal congestion  Physical Exam:  BP 110/71 (BP Location: Right Arm)   Pulse 75   Temp 98.5 F (36.9 C) (Oral)   Resp 16   Ht 5\' 3"  (1.6 m)   Wt 47.6 kg   SpO2 100%   BMI 18.60 kg/m    Gen: No distress  Neuro: Awake and Alert  Skin: Warm and dry  ENT: throat with mild erythema  Neck: no cervical nodes palpated  Lungs: clear   Initiation of care has begun. The patient has been counseled on the process, plan, and necessity for staying for the completion/evaluation, and the remainder of the medical screening examination    Janne Napoleon, NP 09/01/18 1641    Tegeler, Canary Brim, MD 09/02/18 [phone]

## 2018-09-01 NOTE — ED Provider Notes (Signed)
[facility] EMERGENCY DEPARTMENT Provider Note   CSN: [id number] Arrival date & time: 09/01/18  1601     History   Chief Complaint Chief Complaint  Patient presents with  . Nasal Congestion  . Sore Throat    HPI [name] Weber is a 22 y.o. female who presents emergency department today for nasal congestion, sore throat, ear fullness, nonproductive cough began last night.  Patient reports that her son has the same symptoms and was diagnosed with a "common cold".  She reports she has not taken anything for symptoms.  She reports nothing makes her symptoms better or worse.  She denies any fever, headache, chest pain, shortness of breath, hemoptysis, abdominal pain, urinary symptoms or lower extremity swelling.  HPI  Past Medical History:  Diagnosis Date  . Chlamydia infection   . Trichomonas 01/2012  . UTI (lower urinary tract infection)     Patient Active Problem List   Diagnosis Date Noted  . NSVD (normal spontaneous vaginal delivery) 10/08/2014  . Active labor 10/06/2014  . Group B streptococcus urinary tract infection affecting pregnancy in second trimester, antepartum 07/07/2014  . Short interval between pregnancies affecting pregnancy in second trimester, antepartum 07/03/2014  . Sickle cell trait (HCC) 07/03/2014  . Chlamydia infection complicating pregnancy, antepartum 07/27/2013  . Young maternal age, antepartum 07/27/2013    Past Surgical History:  Procedure Laterality Date  . NO PAST SURGERIES       OB History    Gravida  4   Para  3   Term  3   Preterm      AB  1   Living  3     SAB      TAB  0   Ectopic      Multiple      Live Births  3            Home Medications    Prior to Admission medications   Medication Sig Start Date End Date Taking? Authorizing Provider  ondansetron (ZOFRAN ODT) 4 MG disintegrating tablet Take 1 tablet (4 mg total) by mouth every 8 (eight) hours as needed for nausea or vomiting.  05/08/18   Pricilla Loveless, MD    Family History Family History  Problem Relation Age of Onset  . Diabetes Maternal Aunt   . Alcohol abuse Neg Hx   . Arthritis Neg Hx   . Asthma Neg Hx   . Birth defects Neg Hx   . Cancer Neg Hx   . COPD Neg Hx   . Depression Neg Hx   . Drug abuse Neg Hx   . Early death Neg Hx   . Hearing loss Neg Hx   . Heart disease Neg Hx   . Hyperlipidemia Neg Hx   . Hypertension Neg Hx   . Kidney disease Neg Hx   . Learning disabilities Neg Hx   . Mental illness Neg Hx   . Mental retardation Neg Hx   . Miscarriages / Stillbirths Neg Hx   . Stroke Neg Hx   . Vision loss Neg Hx     Social History Social History   Tobacco Use  . Smoking status: Never Smoker  . Smokeless tobacco: Never Used  Substance Use Topics  . Alcohol use: No  . Drug use: No     Allergies   Patient has no known allergies.   Review of Systems Review of Systems  Constitutional: Negative for fever.  HENT: Positive for sore throat. Negative  for trouble swallowing.   Eyes: Negative for pain.  Respiratory: Positive for cough. Negative for shortness of breath.   Cardiovascular: Negative for chest pain.  Gastrointestinal: Negative for abdominal pain, nausea and vomiting.  Genitourinary: Negative for dysuria.  Musculoskeletal: Negative for neck stiffness.  Skin: Negative for rash.  Neurological: Negative for headaches.  Hematological: Negative for adenopathy.  All other systems reviewed and are negative.    Physical Exam Updated Vital Signs BP 110/71 (BP Location: Right Arm)   Pulse 75   Temp 98.5 F (36.9 C) (Oral)   Resp 16   Ht 5\' 3"  (1.6 m)   Wt 47.6 kg   LMP 08/18/2018   SpO2 100%   BMI 18.60 kg/m   Physical Exam  Constitutional: She appears well-developed and well-nourished.  HENT:  Head: Normocephalic and atraumatic.  Right Ear: Tympanic membrane, external ear and ear canal normal.  Left Ear: Tympanic membrane, external ear and ear canal normal.    Nose: Mucosal edema and rhinorrhea present. Right sinus exhibits no maxillary sinus tenderness and no frontal sinus tenderness. Left sinus exhibits no maxillary sinus tenderness and no frontal sinus tenderness.  Mouth/Throat: Uvula is midline, oropharynx is clear and moist and mucous membranes are normal. No tonsillar exudate.  The patient has normal phonation and is in control of secretions. No stridor.  Midline uvula without edema. Soft palate rises symmetrically. Noted tonsillar erythema without edema or exudates. No PTA. Tongue protrusion is normal. No trismus. No creptius on neck palpation and patient has good dentition. No gingival erythema or fluctuance noted. Mucus membranes moist.   Eyes: Pupils are equal, round, and reactive to light. Right eye exhibits no discharge. Left eye exhibits no discharge. No scleral icterus.  Neck: Trachea normal. Neck supple. No spinous process tenderness present. No neck rigidity. Normal range of motion present.  No nuchal rigidity or meningismus  Cardiovascular: Normal rate, regular rhythm and intact distal pulses.  No murmur heard. Pulses:      Radial pulses are 2+ on the right side, and 2+ on the left side.       Dorsalis pedis pulses are 2+ on the right side, and 2+ on the left side.       Posterior tibial pulses are 2+ on the right side, and 2+ on the left side.  No lower extremity swelling or edema. Calves symmetric in size bilaterally.  Pulmonary/Chest: Effort normal and breath sounds normal. She exhibits no tenderness.  No increased work of breathing. No accessory muscle use. Patient is sitting upright, speaking in full sentences without difficulty   Abdominal: Soft. Bowel sounds are normal. There is no tenderness. There is no rebound and no guarding.  Musculoskeletal: She exhibits no edema.  Lymphadenopathy:       Head (right side): No submental, no submandibular and no tonsillar adenopathy present.       Head (left side): No submental, no  submandibular and no tonsillar adenopathy present.    She has no cervical adenopathy.  Neurological: She is alert.  Skin: Skin is warm and dry. No petechiae, no purpura and no rash noted. She is not diaphoretic.  Psychiatric: She has a normal mood and affect.  Nursing note and vitals reviewed.    ED Treatments / Results  Labs (all labs ordered are listed, but only abnormal results are displayed) Labs Reviewed  GROUP A STREP BY PCR    EKG None  Radiology No results found.  Procedures Procedures (including critical care time)  Medications Ordered  in ED Medications - No data to display   Initial Impression / Assessment and Plan / ED Course  I have reviewed the triage vital signs and the nursing notes.  Pertinent labs & imaging results that were available during my care of the patient were reviewed by me and considered in my medical decision making (see chart for details).     22 y.o. female with nasal congestion, sore throat, and dry cough.  Pt afebrile without tonsillar exudate, negative strep. Presents with dysphagia. Negative strep. Diagnosis of viral pharyngitis. No abx indicated. Discharged with symptomatic tx for pain  Pt does not appear dehydrated, but did discuss importance of water rehydration. Presentation non concerning for PTA or RPA. No trismus or uvula deviation.  Patient does not complain of a cough.  She is afebrile lungs clear to auscultation bilaterally.  Do not feel she needs a chest x-ray at this time.  Doubt pneumonia.  Specific return precautions discussed. Pt able to drink water in ED without difficulty with intact air way. Recommended PCP follow up.  Final Clinical Impressions(s) / ED Diagnoses   Final diagnoses:  Sore throat  Viral URI with cough    ED Discharge Orders         Ordered    Guaifenesin [facility] MAXIMUM STRENGTH) 1200 MG TB12  Every 12 hours PRN     09/01/18 1949    benzonatate (TESSALON) 100 MG capsule  Every 8 hours     09/01/18  1949           Princella Pellegrini 09/01/18 1949    Bethann Berkshire, MD 09/03/18 1224

## 2018-11-21 ENCOUNTER — Other Ambulatory Visit: Payer: Self-pay

## 2018-11-21 ENCOUNTER — Observation Stay (HOSPITAL_COMMUNITY)
Admission: EM | Admit: 2018-11-21 | Discharge: 2018-11-22 | Disposition: A | Discharge status: Home / Self Care | Payer: Medicaid Other | Attending: Internal Medicine | Admitting: Internal Medicine

## 2018-11-21 ENCOUNTER — Encounter (HOSPITAL_COMMUNITY): Payer: Self-pay | Admitting: Emergency Medicine

## 2018-11-21 DIAGNOSIS — F1210 Cannabis abuse, uncomplicated: Secondary | ICD-10-CM | POA: Insufficient documentation

## 2018-11-21 DIAGNOSIS — E876 Hypokalemia: Secondary | ICD-10-CM | POA: Insufficient documentation

## 2018-11-21 DIAGNOSIS — R3129 Other microscopic hematuria: Secondary | ICD-10-CM | POA: Insufficient documentation

## 2018-11-21 DIAGNOSIS — R112 Nausea with vomiting, unspecified: Principal | ICD-10-CM | POA: Insufficient documentation

## 2018-11-21 DIAGNOSIS — F12988 Cannabis use, unspecified with other cannabis-induced disorder: Secondary | ICD-10-CM

## 2018-11-21 DIAGNOSIS — F1010 Alcohol abuse, uncomplicated: Secondary | ICD-10-CM | POA: Diagnosis present

## 2018-11-21 DIAGNOSIS — R809 Proteinuria, unspecified: Secondary | ICD-10-CM | POA: Diagnosis not present

## 2018-11-21 LAB — COMPREHENSIVE METABOLIC PANEL
ALT: 20 U/L (ref 0–44)
AST: 25 U/L (ref 15–41)
Albumin: 5.4 g/dL — ABNORMAL HIGH (ref 3.5–5.0)
Alkaline Phosphatase: 37 U/L — ABNORMAL LOW (ref 38–126)
Anion gap: 13 (ref 5–15)
BUN: 11 mg/dL (ref 6–20)
CHLORIDE: 109 mmol/L (ref 98–111)
CO2: 19 mmol/L — ABNORMAL LOW (ref 22–32)
Calcium: 9.4 mg/dL (ref 8.9–10.3)
Creatinine, Ser: 0.6 mg/dL (ref 0.44–1.00)
GFR calc Af Amer: >60 mL/min (ref >60)
Glucose, Bld: 134 mg/dL — ABNORMAL HIGH (ref 70–99)
Potassium: 3.3 mmol/L — ABNORMAL LOW (ref 3.5–5.1)
Sodium: 141 mmol/L (ref 135–145)
Total Bilirubin: 0.8 mg/dL (ref 0.3–1.2)
Total Protein: 8.1 g/dL (ref 6.5–8.1)

## 2018-11-21 LAB — CBC
HCT: 39.1 % (ref 36.0–46.0)
Hemoglobin: 13.8 g/dL (ref 12.0–15.0)
MCH: 27.5 pg (ref 26.0–34.0)
MCHC: 35.3 g/dL (ref 30.0–36.0)
MCV: 77.9 fL — ABNORMAL LOW (ref 80.0–100.0)
Platelets: 281 10*3/uL (ref 150–400)
RBC: 5.02 MIL/uL (ref 3.87–5.11)
RDW: 13.8 % (ref 11.5–15.5)
WBC: 17.1 10*3/uL — AB (ref 4.0–10.5)
nRBC: 0 % (ref 0.0–0.2)

## 2018-11-21 LAB — MAGNESIUM: Magnesium: 2.2 mg/dL (ref 1.7–2.4)

## 2018-11-21 LAB — URINALYSIS, ROUTINE W REFLEX MICROSCOPIC
Bacteria, UA: NONE SEEN
Bilirubin Urine: NEGATIVE
Glucose, UA: NEGATIVE mg/dL
HGB URINE DIPSTICK: NEGATIVE
Ketones, ur: 20 mg/dL — AB
NITRITE: NEGATIVE
Protein, ur: 100 mg/dL — AB
Specific Gravity, Urine: 1.029 (ref 1.005–1.030)
pH: 8 (ref 5.0–8.0)

## 2018-11-21 LAB — RAPID URINE DRUG SCREEN, HOSP PERFORMED
Amphetamines: NOT DETECTED
Barbiturates: NOT DETECTED
Benzodiazepines: NOT DETECTED
Cocaine: NOT DETECTED
Opiates: NOT DETECTED
Tetrahydrocannabinol: POSITIVE — AB

## 2018-11-21 LAB — PHOSPHORUS: Phosphorus: 2.9 mg/dL (ref 2.5–4.6)

## 2018-11-21 LAB — I-STAT BETA HCG BLOOD, ED (MC, WL, AP ONLY): I-stat hCG, quantitative: <5 m[IU]/mL (ref <5)

## 2018-11-21 LAB — LIPASE, BLOOD: LIPASE: 23 U/L (ref 11–51)

## 2018-11-21 MED ORDER — ONDANSETRON HCL 4 MG/2ML IJ SOLN
4.0000 mg | Freq: Once | INTRAMUSCULAR | Status: AC
  Administered 2018-11-21: 4 mg via INTRAVENOUS
  Filled 2018-11-21: qty 2

## 2018-11-21 MED ORDER — HALOPERIDOL LACTATE 5 MG/ML IJ SOLN
2.0000 mg | Freq: Once | INTRAMUSCULAR | Status: AC
  Administered 2018-11-21: 2 mg via INTRAMUSCULAR
  Filled 2018-11-21: qty 1

## 2018-11-21 MED ORDER — FAMOTIDINE IN NACL 20-0.9 MG/50ML-% IV SOLN
20.0000 mg | Freq: Two times a day (BID) | INTRAVENOUS | Status: DC
  Administered 2018-11-22 (×2): 20 mg via INTRAVENOUS
  Filled 2018-11-21 (×3): qty 50

## 2018-11-21 MED ORDER — ONDANSETRON 4 MG PO TBDP: 4.0000 mg | ORAL_TABLET | Freq: Three times a day (TID) | ORAL | 0 refills | Status: DC | PRN

## 2018-11-21 MED ORDER — SODIUM CHLORIDE 0.9 % IV BOLUS
1000.0000 mL | Freq: Once | INTRAVENOUS | Status: AC
  Administered 2018-11-21: 1000 mL via INTRAVENOUS

## 2018-11-21 MED ORDER — ALUM & MAG HYDROXIDE-SIMETH 200-200-20 MG/5ML PO SUSP
15.0000 mL | Freq: Once | ORAL | Status: AC
  Administered 2018-11-21: 15 mL via ORAL
  Filled 2018-11-21: qty 30

## 2018-11-21 MED ORDER — POTASSIUM CHLORIDE IN NACL 20-0.9 MEQ/L-% IV SOLN
INTRAVENOUS | Status: DC
  Filled 2018-11-21: qty 1000

## 2018-11-21 MED ORDER — ONDANSETRON HCL 4 MG/2ML IJ SOLN: 4.0000 mg | Freq: Four times a day (QID) | INTRAMUSCULAR | Status: DC | PRN

## 2018-11-21 MED ORDER — ACETAMINOPHEN 650 MG RE SUPP: 650.0000 mg | Freq: Four times a day (QID) | RECTAL | Status: DC | PRN

## 2018-11-21 MED ORDER — KETOROLAC TROMETHAMINE 30 MG/ML IJ SOLN
30.0000 mg | Freq: Once | INTRAMUSCULAR | Status: AC
  Administered 2018-11-21: 30 mg via INTRAVENOUS
  Filled 2018-11-21: qty 1

## 2018-11-21 MED ORDER — CAPSAICIN 0.025 % EX CREA
TOPICAL_CREAM | Freq: Once | CUTANEOUS | Status: AC
  Administered 2018-11-21: 20:00:00 via TOPICAL
  Filled 2018-11-21: qty 60

## 2018-11-21 MED ORDER — POTASSIUM CHLORIDE IN NACL 20-0.9 MEQ/L-% IV SOLN
INTRAVENOUS | Status: AC
  Administered 2018-11-22: via INTRAVENOUS
  Filled 2018-11-21 (×2): qty 1000

## 2018-11-21 MED ORDER — PROMETHAZINE HCL 25 MG/ML IJ SOLN
25.0000 mg | Freq: Once | INTRAMUSCULAR | Status: AC
  Administered 2018-11-21: 25 mg via INTRAMUSCULAR
  Filled 2018-11-21: qty 1

## 2018-11-21 MED ORDER — ONDANSETRON HCL 4 MG PO TABS: 4.0000 mg | ORAL_TABLET | Freq: Four times a day (QID) | ORAL | Status: DC | PRN

## 2018-11-21 MED ORDER — MAGNESIUM SULFATE 2 GM/50ML IV SOLN
2.0000 g | Freq: Once | INTRAVENOUS | Status: AC
  Administered 2018-11-22: 2 g via INTRAVENOUS
  Filled 2018-11-21: qty 50

## 2018-11-21 MED ORDER — CAPSAICIN 0.075 % EX CREA
TOPICAL_CREAM | Freq: Once | CUTANEOUS | Status: DC
  Filled 2018-11-21: qty 60

## 2018-11-21 MED ORDER — ACETAMINOPHEN 325 MG PO TABS: 650.0000 mg | ORAL_TABLET | Freq: Four times a day (QID) | ORAL | Status: DC | PRN

## 2018-11-21 NOTE — ED Triage Notes (Addendum)
Patient here via EMS with complaints of abd pain, nausea, vomiting, diarrhea that started around 10am. Reports going out "drinking" last night. "Feels worse than my normal hangover". Vomiting in triage.

## 2018-11-21 NOTE — H&P (Signed)
History and Physical    [name] Weber WGN:[id number] DOB: 10/26/1996 DOA: 11/21/2018  PCP: Dossie ArbourJennings, Jessica, MD   Patient coming from: Home.  I have personally briefly [facility]  Chief Complaint: Abdominal pain, nausea and vomiting.  HPI: [name] ArenasQuintasia L Weber is a 22 y.o. female with medical history significant of chlamydia infection, trichomonas, history UTI, history of episodes of nausea and emesis who is coming to the emergency department with complaints of persistent abdominal pain associated with nausea and multiple episodes of emesis since about 10 AM.  She reports going out and drinking last night.  She states that this feels worse than her usual hangover.  She has been vomiting in the emergency department.  She states that she drank a pint of alcohol and smoked "3 blunts" last night.  She states that she woke up more on lower than usual, had 3 episodes of diarrhea more than 25 episodes of emesis since then.  No constipation, melena or hematochezia.  She denies fever, but complains of chills and fatigue.  No headache, sore throat, rhinorrhea, wheezing or hemoptysis.  She has had a dry cough for the past 2 to 3 days.  She denies chest pain, palpitations, dizziness, diaphoresis, PND, orthopnea or pitting edema of the lower extremities.  She denies dysuria, frequency or hematuria.  No polyuria, polydipsia, polyphagia or blurred vision.  Denies skin pruritus.  ED Course: Initial vital signs temperature 98 F, pulse 99, respirations 22, blood pressure 132/82 mmHg and O2 sat 99% on room air.  She received Mylanta 15 mL p.o. x1, Zostrix cream applied to abdomen, 2 doses of Zofran 4 mg IVP x1, Haldol 2 mg IM x1, Toradol 30 mg IVP, Phenergan 25 mg IVP and at thousand mL NS bolus.  She has had very little relief from her symptoms.  UDS is positive for THC.  Urinalysis shows Knoll count was 17.1, hemoglobin 13.8 g/dL and platelets [id number].  Her CMP shows a  hazy appearance, ketones of 20 and protein of 100 mg/dL.  There was small leukocyte esterase.  11-20 RBCs, 6-10 WBC per hpf and no bacteria on microscopic examination.  Sodium of 3.3 and CO2 of 19 mmol/L.  Glucose was 134 mg/dL, albumin was 5.4 g/dL and alkaline phosphatase was low at 37 units/L.  All other values are within normal limits.  Lipase, phosphorus and magnesium normal.  Review of Systems: As per HPI otherwise 10 point review of systems negative.  Past Medical History:  Diagnosis Date  . Chlamydia infection   . Trichomonas 01/2012  . UTI (lower urinary tract infection)     Past Surgical History:  Procedure Laterality Date  . NO PAST SURGERIES       reports that she has never smoked. She has never used smokeless tobacco. She reports that she does not drink alcohol or use drugs.  No Known Allergies  Family History  Problem Relation Age of Onset  . Diabetes Maternal Aunt   . Alcohol abuse Neg Hx   . Arthritis Neg Hx   . Asthma Neg Hx   . Birth defects Neg Hx   . Cancer Neg Hx   . COPD Neg Hx   . Depression Neg Hx   . Drug abuse Neg Hx   . Early death Neg Hx   . Hearing loss Neg Hx   . Heart disease Neg Hx   . Hyperlipidemia Neg Hx   . Hypertension Neg Hx   . Kidney  disease Neg Hx   . Learning disabilities Neg Hx   . Mental illness Neg Hx   . Mental retardation Neg Hx   . Miscarriages / Stillbirths Neg Hx   . Stroke Neg Hx   . Vision loss Neg Hx    Prior to Admission medications   Medication Sig Start Date End Date Taking? Authorizing Provider  benzonatate (TESSALON) 100 MG capsule Take 1 capsule (100 mg total) by mouth every 8 (eight) hours. Patient not taking: Reported on 11/21/2018 09/01/18   Maczis, Elmer Sow, PA-C  Guaifenesin [facility] MAXIMUM STRENGTH) 1200 MG TB12 Take 1 tablet (1,200 mg total) by mouth every 12 (twelve) hours as needed. Patient not taking: Reported on 11/21/2018 09/01/18   Jacinto Halim, PA-C  ondansetron (ZOFRAN ODT) 4 MG  disintegrating tablet Take 1 tablet (4 mg total) by mouth every 8 (eight) hours as needed for nausea or vomiting. 11/21/18   Robinson, [place] N, PA-C    Physical Exam: Vitals:   11/21/18 1806 11/21/18 1850 11/21/18 2102 11/21/18 2254  BP:  (!) 86/58 112/88 (!) 136/95  Pulse:  72 80 86  Resp:  14 18 18   Temp:      TempSrc:      SpO2:  99% 98% 100%  Weight: 49.4 kg     Height: 5' (1.524 m)       Constitutional: Looks acutely ill. Eyes: PERRL, lids and conjunctivae normal ENMT: Mucous membranes are slightly dry. Posterior pharynx clear of any exudate or lesions. Neck: Normal, supple, no masses, no thyromegaly Respiratory: clear to auscultation bilaterally, no wheezing, no crackles. Normal respiratory effort. No accessory muscle use.  Cardiovascular: Regular rate and rhythm, no murmurs / rubs / gallops. No extremity edema. 2+ pedal pulses. No carotid bruits.  Abdomen: Soft, mild epigastric tenderness, no guarding or rebound, no masses palpated. No hepatosplenomegaly. Bowel sounds positive.  Musculoskeletal: no clubbing / cyanosis. Good ROM, no contractures. Normal muscle tone.  Skin: no rashes, lesions, ulcers on limited dermatological examination. Neurologic: CN 2-12 grossly intact. Sensation intact, DTR normal. Strength 5/5 in all 4.  Psychiatric: Normal judgment and insight. Alert and oriented x 3. Normal mood.   Labs on Admission: I have personally reviewed following labs and imaging studies  CBC: Recent Labs  Lab 11/21/18 1450  WBC 17.1*  HGB 13.8  HCT 39.1  MCV 77.9*  PLT 281   Basic Metabolic Panel: Recent Labs  Lab 11/21/18 1450  NA 141  K 3.3*  CL 109  CO2 19*  GLUCOSE 134*  BUN 11  CREATININE 0.60  CALCIUM 9.4   GFR: Estimated Creatinine Clearance: 79.9 mL/min (by C-G formula based on SCr of 0.6 mg/dL). Liver Function Tests: Recent Labs  Lab 11/21/18 1450  AST 25  ALT 20  ALKPHOS 37*  BILITOT 0.8  PROT 8.1  ALBUMIN 5.4*   Recent Labs  Lab  11/21/18 1450  LIPASE 23   No results for input(s): AMMONIA in the last 168 hours. Coagulation Profile: No results for input(s): INR, PROTIME in the last 168 hours. Cardiac Enzymes: No results for input(s): CKTOTAL, CKMB, CKMBINDEX, TROPONINI in the last 168 hours. BNP (last 3 results) No results for input(s): PROBNP in the last 8760 hours. HbA1C: No results for input(s): HGBA1C in the last 72 hours. CBG: No results for input(s): GLUCAP in the last 168 hours. Lipid Profile: No results for input(s): CHOL, HDL, LDLCALC, TRIG, CHOLHDL, LDLDIRECT in the last 72 hours. Thyroid Function Tests: No results for input(s): TSH,  T4TOTAL, FREET4, T3FREE, THYROIDAB in the last 72 hours. Anemia Panel: No results for input(s): VITAMINB12, FOLATE, FERRITIN, TIBC, IRON, RETICCTPCT in the last 72 hours. Urine analysis:    Component Value Date/Time   COLORURINE YELLOW 05/08/2018 2245   APPEARANCEUR HAZY (A) 05/08/2018 2245   LABSPEC 1.023 05/08/2018 2245   PHURINE 7.0 05/08/2018 2245   GLUCOSEU NEGATIVE 05/08/2018 2245   HGBUR LARGE (A) 05/08/2018 2245   BILIRUBINUR NEGATIVE 05/08/2018 2245   KETONESUR 80 (A) 05/08/2018 2245   PROTEINUR 100 (A) 05/08/2018 2245   UROBILINOGEN 1.0 10/05/2014 1346   NITRITE NEGATIVE 05/08/2018 2245   LEUKOCYTESUR TRACE (A) 05/08/2018 2245    Radiological Exams on Admission: No results found.  EKG: Independently reviewed.   Assessment/Plan Assessment/Plan Principal Problem:   Intractable nausea and vomiting   Cannabinoid hyperemesis syndrome (HCC) Observation/MedSurg. Continue IV fluids. Famotidine 20 mg IVPB every 12 hours. Zofran 4 mg IVP every 6 hours. Lorazepam 0.5 mg IVP every 6 hours x 3 doses. Follow-up CBC and electrolytes. Advised the patient to cease cannabis consumption entirely, but if not, she should reduce harm by just using with a lot of moderation and use sporadically to avoid getting this persistent nausea and vomiting.  Should  consider seeing [facility] cannabis use disorder.  Active Problems:   Hypokalemia Replacing. Supplemental magnesium as well. Follow-up potassium level.    Alcohol abuse Per patient, she does not drink daily. Magnesium was supplemented. Monitor for signs of withdrawal and start CIWA protocol if needed.    DVT prophylaxis: SCDs. Code Status: Full code. Family Communication:  Disposition Plan: Observation for IV hydration and symptoms control. Consults called: Admission status: Observation/MedSurg.   Bobette Mo MD [facility] Pager [phone].  If 7PM-7AM, please contact night-coverage [email or web address] Password [email or web address]  11/21/2018, 11:20 PM

## 2018-11-21 NOTE — ED Provider Notes (Signed)
[facility]-EMERGENCY DEPT Provider Note   CSN: [id number] Arrival date & time: 11/21/18  1433     History   Chief Complaint Chief Complaint  Patient presents with  . Abdominal Pain  . Nausea  . Emesis    HPI [name] Weber is a 22 y.o. female presenting to the emergency department with acute onset of nausea, vomiting, and diarrhea that began around 10a.m. this morning.  She has associated epigastric abdominal pain.  She states she was drinking alcohol last night, she drank an unknown amount of Hennessy.  She states she has had multiple similar episodes in the past and was evaluated in the ED for this.  Symptoms feel similar.  She denies fever, urinary symptoms, pelvic complaints.  Does not currently get periods with her Nexplanon.  She does endorse marijuana use, last use was last night.  No history of abdominal surgeries.  Per chart review, patient was evaluated 05/08/2018 as well as 02/03/2017 with similar presentations.  During those visits her labs showed a leukocytosis.  She was treated symptomatically and discharged.  The history is provided by the patient and medical records.    Past Medical History:  Diagnosis Date  . Chlamydia infection   . Trichomonas 01/2012  . UTI (lower urinary tract infection)     Patient Active Problem List   Diagnosis Date Noted  . Intractable nausea and vomiting 11/21/2018  . NSVD (normal spontaneous vaginal delivery) 10/08/2014  . Active labor 10/06/2014  . Group B streptococcus urinary tract infection affecting pregnancy in second trimester, antepartum 07/07/2014  . Short interval between pregnancies affecting pregnancy in second trimester, antepartum 07/03/2014  . Sickle cell trait (HCC) 07/03/2014  . Chlamydia infection complicating pregnancy, antepartum 07/27/2013  . Young maternal age, antepartum 07/27/2013    Past Surgical History:  Procedure Laterality Date  . NO PAST SURGERIES       OB History    Gravida  4   Para  3   Term  3   Preterm      AB  1   Living  3     SAB      TAB  0   Ectopic      Multiple      Live Births  3            Home Medications    Prior to Admission medications   Medication Sig Start Date End Date Taking? Authorizing Provider  benzonatate (TESSALON) 100 MG capsule Take 1 capsule (100 mg total) by mouth every 8 (eight) hours. Patient not taking: Reported on 11/21/2018 09/01/18   Maczis, Elmer SowMichael M, PA-C  Guaifenesin [facility](MUCINEX MAXIMUM STRENGTH) 1200 MG TB12 Take 1 tablet (1,200 mg total) by mouth every 12 (twelve) hours as needed. Patient not taking: Reported on 11/21/2018 09/01/18   Jacinto HalimMaczis, Michael M, PA-C  ondansetron (ZOFRAN ODT) 4 MG disintegrating tablet Take 1 tablet (4 mg total) by mouth every 8 (eight) hours as needed for nausea or vomiting. 11/21/18   Mylo Choi, [place] N, PA-C    Family History Family History  Problem Relation Age of Onset  . Diabetes Maternal Aunt   . Alcohol abuse Neg Hx   . Arthritis Neg Hx   . Asthma Neg Hx   . Birth defects Neg Hx   . Cancer Neg Hx   . COPD Neg Hx   . Depression Neg Hx   . Drug abuse Neg Hx   . Early death Neg Hx   .  Hearing loss Neg Hx   . Heart disease Neg Hx   . Hyperlipidemia Neg Hx   . Hypertension Neg Hx   . Kidney disease Neg Hx   . Learning disabilities Neg Hx   . Mental illness Neg Hx   . Mental retardation Neg Hx   . Miscarriages / Stillbirths Neg Hx   . Stroke Neg Hx   . Vision loss Neg Hx     Social History Social History   Tobacco Use  . Smoking status: Never Smoker  . Smokeless tobacco: Never Used  Substance Use Topics  . Alcohol use: No  . Drug use: No     Allergies   Patient has no known allergies.   Review of Systems Review of Systems  Constitutional: Negative for fever.  Gastrointestinal: Positive for abdominal pain, diarrhea, nausea and vomiting.  Genitourinary: Negative for dysuria, frequency, vaginal bleeding and vaginal discharge.  All other  systems reviewed and are negative.    Physical Exam Updated Vital Signs BP (!) 136/95   Pulse 86   Temp 98 F (36.7 C) (Oral)   Resp 18   Ht 5' (1.524 m)   Wt 49.4 kg   SpO2 100%   BMI 21.29 kg/m   Physical Exam  Constitutional: She appears well-developed and well-nourished.  Non-toxic appearance.  HENT:  Head: Normocephalic and atraumatic.  Mouth/Throat: Oropharynx is clear and moist.  Eyes: Conjunctivae are normal.  Cardiovascular: Normal rate, regular rhythm and normal heart sounds.  Pulmonary/Chest: Effort normal and breath sounds normal.  Abdominal: Soft. Normal appearance and bowel sounds are normal. There is tenderness in the epigastric area. There is no rigidity, no rebound and no guarding.  Neurological: She is alert.  Skin: Skin is warm.  Psychiatric: She has a normal mood and affect. Her behavior is normal.  Nursing note and vitals reviewed.    ED Treatments / Results  Labs (all labs ordered are listed, but only abnormal results are displayed) Labs Reviewed  COMPREHENSIVE METABOLIC PANEL - Abnormal; Notable for the following components:      Result Value   Potassium 3.3 (*)    CO2 19 (*)    Glucose, Bld 134 (*)    Albumin 5.4 (*)    Alkaline Phosphatase 37 (*)    All other components within normal limits  CBC - Abnormal; Notable for the following components:   WBC 17.1 (*)    MCV 77.9 (*)    All other components within normal limits  LIPASE, BLOOD  RAPID URINE DRUG SCREEN, HOSP PERFORMED  URINALYSIS, ROUTINE W REFLEX MICROSCOPIC  MAGNESIUM  PHOSPHORUS  HIV ANTIBODY (ROUTINE TESTING W REFLEX)  CBC WITH DIFFERENTIAL/PLATELET  COMPREHENSIVE METABOLIC PANEL  I-STAT BETA HCG BLOOD, ED ([facility], [facility], [facility] ONLY)    EKG None  Radiology No results found.  Procedures Procedures (including critical care time)  Medications Ordered in ED Medications  0.9 % NaCl with KCl 20 mEq/ L  infusion (has no administration in time range)  magnesium sulfate IVPB 2 g  50 mL (has no administration in time range)  0.9 % NaCl with KCl 20 mEq/ L  infusion (has no administration in time range)  ondansetron (ZOFRAN) tablet 4 mg (has no administration in time range)    Or  ondansetron (ZOFRAN) injection 4 mg (has no administration in time range)  acetaminophen (TYLENOL) tablet 650 mg (has no administration in time range)    Or  acetaminophen (TYLENOL) suppository 650 mg (has no administration in time range)  famotidine (PEPCID) IVPB 20 mg premix (has no administration in time range)  ondansetron (ZOFRAN) injection 4 mg (4 mg Intravenous Given 11/21/18 1715)  sodium chloride 0.9 % bolus 1,000 mL (0 mLs Intravenous Stopped 11/21/18 2144)  ketorolac (TORADOL) 30 MG/ML injection 30 mg (30 mg Intravenous Given 11/21/18 1715)  ondansetron (ZOFRAN) injection 4 mg (4 mg Intravenous Given 11/21/18 1930)  capsaicin (ZOSTRIX) 0.025 % cream ( Topical Given 11/21/18 2027)  promethazine (PHENERGAN) injection 25 mg (25 mg Intramuscular Given 11/21/18 2026)  alum & mag hydroxide-simeth (MAALOX/MYLANTA) 200-200-20 MG/5ML suspension 15 mL (15 mLs Oral Given 11/21/18 2150)  haloperidol lactate (HALDOL) injection 2 mg (2 mg Intramuscular Given 11/21/18 2237)     Initial Impression / Assessment and Plan / ED Course  I have reviewed the triage vital signs and the nursing notes.  Pertinent labs & imaging results that were available during my care of the patient were reviewed by me and considered in my medical decision making (see chart for details).  Clinical Course as of Nov 21 2317  Wynelle Link Nov 21, 2018  6962 Reevaluated.  Reports initial improvement in symptoms, however slight returning.  Re-dose nausea medication and trial capsaicin cream.   [JR]  2140 Reports nausea and vomiting have resolved.  She does endorse some burning abdominal pain now.  tolerating p.o.  Will give antiacids and discharge.   [JR]  2212 On discharge, patient was noticed vomiting once again by RN.  Will dose with  Haldol and reevaluate.   [JR]  2241 Dr. Robb Matar accepting admission for intractable vomiting.   [JR]    Clinical Course User Index [JR] Arnetta Odeh, [place] N, PA-C    Patient presenting with nausea, vomiting, and diarrhea that began this morning.  Endorses alcohol and marijuana use yesterday.  States this feels similar to previous ED visits with similar presentation.  Chart review, patient seen in February 2018 and again in May 2019.  She similarly had a leukocytosis during that visit as well.  She had a negative CT abdomen in February 2018 and was diagnosed with likely cannabinoid hyperemesis syndrome.  Patient states symptoms feel similar today.  On exam, abdomen is soft and nontender.  Afebrile. leukocytosis of 17.1.  CMP is reassuring.  Negative hCG.  Treated with fluids, Toradol, Zofran.  Patient reported initial improvement, however vomiting continued.  Was then treated with Phenergan and capsaicin cream.  This provided significant improvement and patient was prepared for discharge.  As RN was discharging patient, she began vomiting once more.  Haldol ordered.  Suspect cannabinoid hyperemesis syndrome versus intractable vomiting secondary to alcohol use.  Patient admitted for intractable vomiting, Dr. Robb Matar accepting admission.  The patient appears reasonably stabilized for admission considering the current resources, flow, and capabilities available in the ED at this time, and I doubt any other [facility] requiring further screening and/or treatment in the ED prior to admission.  Final Clinical Impressions(s) / ED Diagnoses   Final diagnoses:  Intractable nausea and vomiting    ED Discharge Orders         Ordered    ondansetron (ZOFRAN ODT) 4 MG disintegrating tablet  Every 8 hours PRN     11/21/18 2142           Vaishnav Demartin, [place] N, PA-C 11/21/18 2318    Melene Plan, DO 11/22/18 0021

## 2018-11-21 NOTE — ED Notes (Signed)
PT STATED SHE CANT VOID AT THIS TIME

## 2018-11-21 NOTE — ED Notes (Signed)
Report called to floor RN Danielle at 2300.

## 2018-11-21 NOTE — ED Notes (Signed)
Pt refused going to bathroom, states doesn't have to go

## 2018-11-22 ENCOUNTER — Encounter (HOSPITAL_COMMUNITY): Payer: Self-pay | Admitting: Internal Medicine

## 2018-11-22 DIAGNOSIS — F1290 Cannabis use, unspecified, uncomplicated: Secondary | ICD-10-CM | POA: Diagnosis present

## 2018-11-22 DIAGNOSIS — R112 Nausea with vomiting, unspecified: Secondary | ICD-10-CM | POA: Diagnosis not present

## 2018-11-22 DIAGNOSIS — F1010 Alcohol abuse, uncomplicated: Secondary | ICD-10-CM | POA: Diagnosis present

## 2018-11-22 DIAGNOSIS — E876 Hypokalemia: Secondary | ICD-10-CM

## 2018-11-22 DIAGNOSIS — F1220 Cannabis dependence, uncomplicated: Secondary | ICD-10-CM

## 2018-11-22 DIAGNOSIS — F12988 Cannabis use, unspecified with other cannabis-induced disorder: Secondary | ICD-10-CM

## 2018-11-22 LAB — COMPREHENSIVE METABOLIC PANEL
ALT: 20 U/L (ref 0–44)
ANION GAP: 5 (ref 5–15)
AST: 31 U/L (ref 15–41)
Albumin: 4.4 g/dL (ref 3.5–5.0)
Alkaline Phosphatase: 32 U/L — ABNORMAL LOW (ref 38–126)
BUN: 14 mg/dL (ref 6–20)
CO2: 23 mmol/L (ref 22–32)
Calcium: 8.5 mg/dL — ABNORMAL LOW (ref 8.9–10.3)
Chloride: 112 mmol/L — ABNORMAL HIGH (ref 98–111)
Creatinine, Ser: 0.68 mg/dL (ref 0.44–1.00)
GFR calc Af Amer: >60 mL/min (ref >60)
GFR calc non Af Amer: >60 mL/min (ref >60)
GLUCOSE: 106 mg/dL — AB (ref 70–99)
Potassium: 4.2 mmol/L (ref 3.5–5.1)
Sodium: 140 mmol/L (ref 135–145)
Total Bilirubin: 1.3 mg/dL — ABNORMAL HIGH (ref 0.3–1.2)
Total Protein: 6.7 g/dL (ref 6.5–8.1)

## 2018-11-22 LAB — CBC WITH DIFFERENTIAL/PLATELET
Abs Immature Granulocytes: 0.1 10*3/uL — ABNORMAL HIGH (ref 0.00–0.07)
BASOS ABS: 0 10*3/uL (ref 0.0–0.1)
Basophils Relative: 0 %
Eosinophils Absolute: 0 10*3/uL (ref 0.0–0.5)
Eosinophils Relative: 0 %
HEMATOCRIT: 33 % — AB (ref 36.0–46.0)
Hemoglobin: 11.5 g/dL — ABNORMAL LOW (ref 12.0–15.0)
Immature Granulocytes: 1 %
LYMPHS ABS: 1.9 10*3/uL (ref 0.7–4.0)
LYMPHS PCT: 12 %
MCH: 28.3 pg (ref 26.0–34.0)
MCHC: 34.8 g/dL (ref 30.0–36.0)
MCV: 81.3 fL (ref 80.0–100.0)
Monocytes Absolute: 1.1 10*3/uL — ABNORMAL HIGH (ref 0.1–1.0)
Monocytes Relative: 7 %
NRBC: 0 % (ref 0.0–0.2)
Neutro Abs: 12.4 10*3/uL — ABNORMAL HIGH (ref 1.7–7.7)
Neutrophils Relative %: 80 %
Platelets: 190 10*3/uL (ref 150–400)
RBC: 4.06 MIL/uL (ref 3.87–5.11)
RDW: 13.8 % (ref 11.5–15.5)
WBC: 15.5 10*3/uL — ABNORMAL HIGH (ref 4.0–10.5)

## 2018-11-22 MED ORDER — METOCLOPRAMIDE HCL 5 MG/ML IJ SOLN: 5.0000 mg | Freq: Once | INTRAMUSCULAR | Status: DC

## 2018-11-22 MED ORDER — FAMOTIDINE 20 MG PO TABS: 20.0000 mg | ORAL_TABLET | Freq: Every day | ORAL | 0 refills | Status: DC

## 2018-11-22 MED ORDER — LORAZEPAM 2 MG/ML IJ SOLN: 0.5000 mg | Freq: Once | INTRAMUSCULAR | Status: DC

## 2018-11-22 MED ORDER — PROCHLORPERAZINE EDISYLATE 10 MG/2ML IJ SOLN: 10.0000 mg | INTRAMUSCULAR | Status: DC | PRN

## 2018-11-22 MED ORDER — LORAZEPAM 2 MG/ML IJ SOLN: 0.5000 mg | Freq: Four times a day (QID) | INTRAMUSCULAR | Status: DC | PRN

## 2018-11-22 MED ORDER — POTASSIUM CHLORIDE IN NACL 20-0.45 MEQ/L-% IV SOLN
INTRAVENOUS | Status: DC
  Administered 2018-11-22 (×2): via INTRAVENOUS
  Filled 2018-11-22 (×2): qty 1000

## 2018-11-22 NOTE — Discharge Summary (Signed)
Physician Discharge Summary  [name] Weber ZOX:[id number] DOB: 08/13/1996  PCP: Dossie ArbourJennings, Jessica, MD  Admit date: 11/21/2018 Discharge date: 11/22/2018  Recommendations for Outpatient Follow-up:  1. [facility]/new PCP on 12/01/2018 at 10:50 AM.  To be seen with repeat labs (CBC & BMP). 2. Recommend repeating urine microscopy in a couple of weeks to evaluate for microscopic hematuria and proteinuria noted while hospitalized.  Further evaluation if persistent.  Home Health: None Equipment/Devices: None  Discharge Condition: Improved and stable CODE STATUS: Full Diet recommendation: Regular diet  Discharge Diagnoses:  Principal Problem:   Intractable nausea and vomiting Active Problems:   Hypokalemia   Cannabinoid hyperemesis syndrome (HCC)   Alcohol abuse   Brief Summary: 22 year old female with PMH of chlamydia and trichomonas infection, UTI, occasional alcohol use, chronic THC use, presented to [facility] ED with history of intractable nonbloody nausea and vomiting, abdominal pain that began on the morning of admission.  She reports that she had a party on the night prior to admission where she reportedly drank a pint of Hennessy and smoked "3 blunts" which was more than she usually does.  She woke up the next morning with nausea, vomiting, abdominal pain and 2 episodes of nonbloody diarrhea.  She was unable to keep down any oral intake and hence presented to the ED.  No fever or chills reported.  She had transient dysuria which resolved spontaneously.  She reports being sexually active but uses protection.  Denies vaginal discharge or pruritus.  In the ED, she was afebrile and had stable vital signs.  Work-up showed UDS positive for THC.  WBC 17.1, potassium 3.3, normal magnesium, urine microscopy showed 11-20 RBCs, 6-10 WBCs but no bacteria.  She failed multiple medications in the ED (Mylanta, Zofran, Haldol, Toradol, Phenergan and IV  fluids).  She was thereby admitted for further evaluation and management.  Her intractable nausea, vomiting and abdominal pain were felt to be related to alcohol binge and THC abuse the night prior.  She was treated conservatively with bowel rest, IV fluids, IV Pepcid, antiemetics.  She was counseled extensively regarding cessation of THC use in moderation of alcohol which she verbalized understanding.  Diet was advanced and she tolerated soft diet without nausea, vomiting or abdominal pain.  She has had no BM since hospital arrival and no vomiting since ED.  Lipase normal.  LFTs unremarkable.  Quantitative hCG negative.  Intractable nausea, vomiting, abdominal pain and self-limited diarrhea: Resolved.  Evaluation and management as above.  Discharged on short course of oral Pepcid for possible gastritis and as needed Zofran prescribed from ED.  Hypokalemia: Secondary to GI losses.  Resolved.  Alcohol use: Reports occasional use of alcohol.  Moderation or abstinence counseled.  THC abuse: Cessation counseled.  Leukocytosis: Suspect stress response due to above.?  Does she have chronic mild leukocytosis of unclear etiology.  Improving.  Follow CBCs during close outpatient follow-up with PCP.  Microscopic hematuria and proteinuria: Unclear etiology.  Recommend repeating urine microscopy when she is not menstruating as outpatient in a couple of weeks and further evaluation if it is persistent.  Normocytic anemia: Suspect dilutional from IV fluids.  No bleeding reported.  Recommend follow CBCs closely as outpatient.   Consultations:  None  Procedures:  None   Discharge Instructions  Discharge Instructions    Activity as tolerated - No restrictions   Complete by:  As directed    Call MD for:  difficulty breathing, headache or visual  disturbances   Complete by:  As directed    Call MD for:  extreme fatigue   Complete by:  As directed    Call MD for:  persistant dizziness or  light-headedness   Complete by:  As directed    Call MD for:  persistant nausea and vomiting   Complete by:  As directed    Call MD for:  severe uncontrolled pain   Complete by:  As directed    Call MD for:  temperature >100.4   Complete by:  As directed    Diet general   Complete by:  As directed        Medication List    STOP taking these medications   benzonatate 100 MG capsule Commonly known as:  TESSALON   Guaifenesin 1200 MG Tb12     TAKE these medications   famotidine 20 MG tablet Commonly known as:  PEPCID Take 1 tablet (20 mg total) by mouth daily for 10 days.   ondansetron 4 MG disintegrating tablet Commonly known as:  ZOFRAN-ODT Take 1 tablet (4 mg total) by mouth every 8 (eight) hours as needed for nausea or vomiting.      Follow-up Information    [facility]. Go on 12/01/2018.   Why:  appointment at 10:50am.  To be seen with repeat labs (CBC & BMP). Contact information: [address] [place] [address] [phone]         No Known Allergies    Procedures/Studies: No results found.    Subjective: Patient feels much better.  Denies complaints.  No further nausea, vomiting or abdominal pain.  Has not vomited since ED.  No BM since hospital arrival.  Tolerated soft diet without difficulty.  No dizziness, lightheadedness.  No dysuria, urinary frequency or urgency.  Denies fever or chills.  Discharge Exam:  Vitals:   11/21/18 2102 11/21/18 2254 11/22/18 0100 11/22/18 0645  BP: 112/88 (!) 136/95 (!) 109/55 (!) 103/53  Pulse: 80 86 86 82  Resp: 18 18 18 17   Temp:   99.4 F (37.4 C) 98.6 F (37 C)  TempSrc:   Oral Oral  SpO2: 98% 100% 100% 100%  Weight:      Height:        General: Pleasant young female, small built and nourished lying comfortably propped up in bed.  Oral mucosa moist. Cardiovascular: S1 & S2 heard, RRR, S1/S2 +. No murmurs, rubs, gallops or clicks. No JVD or pedal  edema. Respiratory: Clear to auscultation without wheezing, rhonchi or crackles. No increased work of breathing. Abdominal:  Non distended, non tender & soft. No organomegaly or masses appreciated. Normal bowel sounds heard. CNS: Alert and oriented. No focal deficits. Extremities: no edema, no cyanosis    The results of significant diagnostics from this hospitalization (including imaging, microbiology, ancillary and laboratory) are listed below for reference.      Labs: CBC: Recent Labs  Lab 11/21/18 1450 11/22/18 0429  WBC 17.1* 15.5*  NEUTROABS  --  12.4*  HGB 13.8 11.5*  HCT 39.1 33.0*  MCV 77.9* 81.3  PLT 281 190   Basic Metabolic Panel: Recent Labs  Lab 11/21/18 1449 11/21/18 1450 11/22/18 0429  NA  --  141 140  K  --  3.3* 4.2  CL  --  109 112*  CO2  --  19* 23  GLUCOSE  --  134* 106*  BUN  --  11 14  CREATININE  --  0.60 0.68  CALCIUM  --  9.4 8.5*  MG 2.2  --   --   PHOS 2.9  --   --    Liver Function Tests: Recent Labs  Lab 11/21/18 1450 11/22/18 0429  AST 25 31  ALT 20 20  ALKPHOS 37* 32*  BILITOT 0.8 1.3*  PROT 8.1 6.7  ALBUMIN 5.4* 4.4   Urinalysis    Component Value Date/Time   COLORURINE YELLOW 11/21/2018 2243   APPEARANCEUR HAZY (A) 11/21/2018 2243   LABSPEC 1.029 11/21/2018 2243   PHURINE 8.0 11/21/2018 2243   GLUCOSEU NEGATIVE 11/21/2018 2243   HGBUR NEGATIVE 11/21/2018 2243   BILIRUBINUR NEGATIVE 11/21/2018 2243   KETONESUR 20 (A) 11/21/2018 2243   PROTEINUR 100 (A) 11/21/2018 2243   UROBILINOGEN 1.0 10/05/2014 1346   NITRITE NEGATIVE 11/21/2018 2243   LEUKOCYTESUR SMALL (A) 11/21/2018 2243      Time coordinating discharge: 25 minutes  SIGNED:  Marcellus Scott, MD, FACP, [facility]. [facility] Pager [phone]  If 7PM-7AM, please contact night-coverage [email or web address] Password [email or web address] 11/22/2018, 12:01 PM

## 2018-11-22 NOTE — Care Management Note (Signed)
Case Management Note  Patient Details  Name: [name] Weber MRN: [id number] Date of Birth: 04/08/1996  Subjective/Objective:     Spoke with patient at bedside. States [facility], contacted them but unable to get appt. Patient was okay with trying [facility]. Able to afford about $20 worth of meds, should be able to use $3/4 list. Not taking anything currently.                Action/Plan: Contacted [facility], appt made and placed in AVS. Await final discharge meds.   Expected Discharge Date:                  Expected Discharge Plan:  Home/Self Care  In-House Referral:  PCP / [facility]  Discharge planning Services  CM Consult, Follow-up appt scheduled, Medication Assistance  Post Acute Care Choice:  NA Choice offered to:  Patient  DME Arranged:  N/A DME Agency:  NA  HH Arranged:  NA HH Agency:  NA  Status of Service:  Completed, signed off  If discussed at Long Length of Stay Meetings, dates discussed:    Additional Comments:  [name] Weber, [name] Haydu K, RN 11/22/2018, 9:31 AM

## 2018-11-22 NOTE — Progress Notes (Signed)
At 1026, [place], MD was notified via text page that the pt was able to eat 100% of her soft food and 240 ml of drink. Pt denies N/V at this time.

## 2018-11-30 NOTE — Progress Notes (Deleted)
Patient ID: [name] ArenasQuintasia L Wieting, female   DOB: 12/03/1996, 22 y.o.   MRN: [id number]  After hospital admission 12/1-12/01/2018.  From discharge summary: Recommendations for Outpatient Follow-up:  1. [facility]/new PCP on 12/01/2018 at 10:50 AM.  To be seen with repeat labs (CBC & BMP). 2. Recommend repeating urine microscopy in a couple of weeks to evaluate for microscopic hematuria and proteinuria noted while hospitalized.  Further evaluation if persistent.  Home Health: None Equipment/Devices: None  Discharge Condition: Improved and stable CODE STATUS: Full Diet recommendation: Regular diet  Discharge Diagnoses:  Principal Problem:   Intractable nausea and vomiting Active Problems:   Hypokalemia   Cannabinoid hyperemesis syndrome (HCC)   Alcohol abuse   Brief Summary: 22 year old female with PMH of chlamydia and trichomonas infection, UTI, occasional alcohol use, chronic THC use, presented to [facility] ED with history of intractable nonbloody nausea and vomiting, abdominal pain that began on the morning of admission.  She reports that she had a party on the night prior to admission where she reportedly drank a pint of Hennessy and smoked "3 blunts" which was more than she usually does.  She woke up the next morning with nausea, vomiting, abdominal pain and 2 episodes of nonbloody diarrhea.  She was unable to keep down any oral intake and hence presented to the ED.  No fever or chills reported.  She had transient dysuria which resolved spontaneously.  She reports being sexually active but uses protection.  Denies vaginal discharge or pruritus.  In the ED, she was afebrile and had stable vital signs.  Work-up showed UDS positive for THC.  WBC 17.1, potassium 3.3, normal magnesium, urine microscopy showed 11-20 RBCs, 6-10 WBCs but no bacteria.  She failed multiple medications in the ED (Mylanta, Zofran, Haldol, Toradol, Phenergan and IV  fluids).  She was thereby admitted for further evaluation and management.  Her intractable nausea, vomiting and abdominal pain were felt to be related to alcohol binge and THC abuse the night prior.  She was treated conservatively with bowel rest, IV fluids, IV Pepcid, antiemetics.  She was counseled extensively regarding cessation of THC use in moderation of alcohol which she verbalized understanding.  Diet was advanced and she tolerated soft diet without nausea, vomiting or abdominal pain.  She has had no BM since hospital arrival and no vomiting since ED.  Lipase normal.  LFTs unremarkable.  Quantitative hCG negative.  Intractable nausea, vomiting, abdominal pain and self-limited diarrhea: Resolved.  Evaluation and management as above.  Discharged on short course of oral Pepcid for possible gastritis and as needed Zofran prescribed from ED.  Hypokalemia: Secondary to GI losses.  Resolved.  Alcohol use: Reports occasional use of alcohol.  Moderation or abstinence counseled.  THC abuse: Cessation counseled.  Leukocytosis: Suspect stress response due to above.?  Does she have chronic mild leukocytosis of unclear etiology.  Improving.  Follow CBCs during close outpatient follow-up with PCP.  Microscopic hematuria and proteinuria: Unclear etiology.  Recommend repeating urine microscopy when she is not menstruating as outpatient in a couple of weeks and further evaluation if it is persistent.  Normocytic anemia: Suspect dilutional from IV fluids.  No bleeding reported.  Recommend follow CBCs closely as outpatient.

## 2018-12-01 ENCOUNTER — Inpatient Hospital Stay: Payer: Medicaid Other

## 2018-12-19 ENCOUNTER — Other Ambulatory Visit: Payer: Self-pay

## 2018-12-19 ENCOUNTER — Encounter (HOSPITAL_COMMUNITY): Payer: Self-pay | Admitting: Emergency Medicine

## 2018-12-19 ENCOUNTER — Emergency Department (HOSPITAL_COMMUNITY)
Admission: EM | Admit: 2018-12-19 | Discharge: 2018-12-20 | Disposition: A | Discharge status: Home / Self Care | Payer: Medicaid Other | Attending: Emergency Medicine | Admitting: Emergency Medicine

## 2018-12-19 DIAGNOSIS — R112 Nausea with vomiting, unspecified: Secondary | ICD-10-CM | POA: Diagnosis not present

## 2018-12-19 DIAGNOSIS — Z3202 Encounter for pregnancy test, result negative: Secondary | ICD-10-CM | POA: Diagnosis not present

## 2018-12-19 DIAGNOSIS — F17210 Nicotine dependence, cigarettes, uncomplicated: Secondary | ICD-10-CM | POA: Insufficient documentation

## 2018-12-19 DIAGNOSIS — Z209 Contact with and (suspected) exposure to unspecified communicable disease: Secondary | ICD-10-CM | POA: Insufficient documentation

## 2018-12-19 DIAGNOSIS — R05 Cough: Secondary | ICD-10-CM | POA: Insufficient documentation

## 2018-12-19 DIAGNOSIS — R059 Cough, unspecified: Secondary | ICD-10-CM

## 2018-12-19 DIAGNOSIS — J029 Acute pharyngitis, unspecified: Secondary | ICD-10-CM | POA: Diagnosis not present

## 2018-12-19 DIAGNOSIS — R1013 Epigastric pain: Secondary | ICD-10-CM | POA: Diagnosis not present

## 2018-12-19 DIAGNOSIS — R0981 Nasal congestion: Secondary | ICD-10-CM | POA: Insufficient documentation

## 2018-12-19 LAB — URINALYSIS, ROUTINE W REFLEX MICROSCOPIC
Bacteria, UA: NONE SEEN
Bilirubin Urine: NEGATIVE
GLUCOSE, UA: NEGATIVE mg/dL
Ketones, ur: 80 mg/dL — AB
Leukocytes, UA: NEGATIVE
NITRITE: NEGATIVE
PROTEIN: 100 mg/dL — AB
Specific Gravity, Urine: 1.023 (ref 1.005–1.030)
pH: 6 (ref 5.0–8.0)

## 2018-12-19 LAB — CBC
HCT: 37.9 % (ref 36.0–46.0)
Hemoglobin: 13 g/dL (ref 12.0–15.0)
MCH: 27.7 pg (ref 26.0–34.0)
MCHC: 34.3 g/dL (ref 30.0–36.0)
MCV: 80.6 fL (ref 80.0–100.0)
Platelets: 219 10*3/uL (ref 150–400)
RBC: 4.7 MIL/uL (ref 3.87–5.11)
RDW: 13 % (ref 11.5–15.5)
WBC: 18.2 10*3/uL — ABNORMAL HIGH (ref 4.0–10.5)
nRBC: 0 % (ref 0.0–0.2)

## 2018-12-19 LAB — COMPREHENSIVE METABOLIC PANEL
ALT: 24 U/L (ref 0–44)
AST: 41 U/L (ref 15–41)
Albumin: 5.2 g/dL — ABNORMAL HIGH (ref 3.5–5.0)
Alkaline Phosphatase: 40 U/L (ref 38–126)
Anion gap: 17 — ABNORMAL HIGH (ref 5–15)
BUN: 13 mg/dL (ref 6–20)
CO2: 21 mmol/L — ABNORMAL LOW (ref 22–32)
Calcium: 9.4 mg/dL (ref 8.9–10.3)
Chloride: 105 mmol/L (ref 98–111)
Creatinine, Ser: 0.68 mg/dL (ref 0.44–1.00)
GFR calc Af Amer: >60 mL/min (ref >60)
GFR calc non Af Amer: >60 mL/min (ref >60)
Glucose, Bld: 93 mg/dL (ref 70–99)
Potassium: 3.2 mmol/L — ABNORMAL LOW (ref 3.5–5.1)
Sodium: 143 mmol/L (ref 135–145)
Total Bilirubin: 0.7 mg/dL (ref 0.3–1.2)
Total Protein: 8.2 g/dL — ABNORMAL HIGH (ref 6.5–8.1)

## 2018-12-19 LAB — I-STAT BETA HCG BLOOD, ED (MC, WL, AP ONLY): I-stat hCG, quantitative: <5 m[IU]/mL (ref <5)

## 2018-12-19 LAB — LIPASE, BLOOD: Lipase: 25 U/L (ref 11–51)

## 2018-12-19 MED ORDER — ACETAMINOPHEN 325 MG PO TABS
650.0000 mg | ORAL_TABLET | Freq: Once | ORAL | Status: AC
  Administered 2018-12-19: 650 mg via ORAL
  Filled 2018-12-19: qty 2

## 2018-12-19 MED ORDER — SCOPOLAMINE 1 MG/3DAYS TD PT72
1.0000 | MEDICATED_PATCH | TRANSDERMAL | Status: DC
  Administered 2018-12-20: 1.5 mg via TRANSDERMAL
  Filled 2018-12-19: qty 1

## 2018-12-19 MED ORDER — ONDANSETRON 4 MG PO TBDP
4.0000 mg | ORAL_TABLET | Freq: Once | ORAL | Status: AC | PRN
  Administered 2018-12-19: 4 mg via ORAL
  Filled 2018-12-19: qty 1

## 2018-12-19 MED ORDER — HALOPERIDOL LACTATE 5 MG/ML IJ SOLN
2.0000 mg | Freq: Once | INTRAMUSCULAR | Status: AC
  Administered 2018-12-20: 2 mg via INTRAVENOUS
  Filled 2018-12-19: qty 1

## 2018-12-19 MED ORDER — SODIUM CHLORIDE 0.9 % IV BOLUS
1000.0000 mL | Freq: Once | INTRAVENOUS | Status: AC
  Administered 2018-12-19: 1000 mL via INTRAVENOUS

## 2018-12-19 MED ORDER — POTASSIUM CHLORIDE CRYS ER 20 MEQ PO TBCR
40.0000 meq | EXTENDED_RELEASE_TABLET | Freq: Once | ORAL | Status: AC
  Administered 2018-12-20: 40 meq via ORAL
  Filled 2018-12-19: qty 2

## 2018-12-19 MED ORDER — SODIUM CHLORIDE 0.9 % IV BOLUS
1000.0000 mL | Freq: Once | INTRAVENOUS | Status: AC
  Administered 2018-12-20: 1000 mL via INTRAVENOUS

## 2018-12-19 MED ORDER — METOCLOPRAMIDE HCL 5 MG/ML IJ SOLN
10.0000 mg | Freq: Once | INTRAMUSCULAR | Status: AC
  Administered 2018-12-19: 10 mg via INTRAVENOUS
  Filled 2018-12-19: qty 2

## 2018-12-19 MED ORDER — ONDANSETRON 4 MG PO TBDP
4.0000 mg | ORAL_TABLET | Freq: Once | ORAL | Status: AC
  Administered 2018-12-19: 4 mg via ORAL
  Filled 2018-12-19: qty 1

## 2018-12-19 NOTE — ED Provider Notes (Signed)
Care assumed from PA Leighton ParodyAnna Weber at shift change, please see her note for full details, but in brief [name] Weber is a 22 y.o. female who presents for evaluation of dry cough, nausea and vomiting, symptoms started today.  She also reports some intermittent associated abdominal pain.  Review reveals patient does have history of cyclic vomiting and endorses marijuana use.  Lab evaluation significant for leukocytosis of 18.2, mild hypokalemia, no other acute electrolyte derangements, urinalysis without acute signs of infection but does have some hematuria, urine culture sent.  Negative pregnancy.  At shift change pending CT abdomen pelvis due to patient's persistent vomiting despite multiple antiemetics.  Patient given additional fluid bolus, Haldol and scopolamine patch for symptomatic control.  Plan: Follow-up on CT abdomen pelvis and reassess after additional round of antiemetics and fluid, added on Bentyl for cramping abdominal pain, and p.o. potassium replacement.  Labs Reviewed  COMPREHENSIVE METABOLIC PANEL - Abnormal; Notable for the following components:      Result Value   Potassium 3.2 (*)    CO2 21 (*)    Total Protein 8.2 (*)    Albumin 5.2 (*)    Anion gap 17 (*)    All other components within normal limits  CBC - Abnormal; Notable for the following components:   WBC 18.2 (*)    All other components within normal limits  URINALYSIS, ROUTINE W REFLEX MICROSCOPIC - Abnormal; Notable for the following components:   Hgb urine dipstick MODERATE (*)    Ketones, ur 80 (*)    Protein, ur 100 (*)    All other components within normal limits  URINE CULTURE  LIPASE, BLOOD  I-STAT BETA HCG BLOOD, ED ([facility], [facility], [facility] ONLY)   Ct Abdomen Pelvis W Contrast  Result Date: 12/20/2018 CLINICAL DATA:  Nausea, vomiting, abdominal pain. EXAM: CT ABDOMEN AND PELVIS WITH CONTRAST TECHNIQUE: Multidetector CT imaging of the abdomen and pelvis was performed using the standard protocol following bolus  administration of intravenous contrast. CONTRAST:  100mL ISOVUE-300 IOPAMIDOL (ISOVUE-300) INJECTION 61% COMPARISON:  02/04/2017 CT abdomen/pelvis. FINDINGS: Lower chest: No significant pulmonary nodules or acute consolidative airspace disease. Hepatobiliary: Normal liver size. No liver mass. Normal gallbladder with no radiopaque cholelithiasis. No biliary ductal dilatation. Pancreas: Normal, with no mass or duct dilation. Spleen: Normal size. No mass. Adrenals/Urinary Tract: Normal adrenals. Normal kidneys with no hydronephrosis and no renal mass. Normal bladder. Stomach/Bowel: Normal non-distended stomach. Normal caliber small bowel with no small bowel wall thickening. Appendix not discretely visualized. No pericecal inflammatory changes. Normal large bowel with no diverticulosis, large bowel wall thickening or pericolonic fat stranding. Vascular/Lymphatic: Normal caliber abdominal aorta. Patent portal, splenic, hepatic and renal veins. No pathologically enlarged lymph nodes in the abdomen or pelvis. Reproductive: Grossly normal uterus.  No adnexal mass. Other: No pneumoperitoneum, ascites or focal fluid collection. Musculoskeletal: No aggressive appearing focal osseous lesions. IMPRESSION: No acute abnormality. No evidence of bowel obstruction or acute bowel inflammation. Electronically Signed   By: Delbert PhenixJason A Poff M.D.   On: 12/20/2018 01:14    MDM  Labs overall reassuring and CT abdomen pelvis without any acute intra-abdominal pathology.  On reevaluation after antiemetics patient is tolerating p.o. fluids and was able to tolerate p.o. potassium supplementation with no further vomiting.  Requesting Sprite to drink.  Reports improvement in abdominal pain, on my repeat exam there are no signs of a surgical abdomen.  At this time patient will be discharged home, Scopolamine patch in place and patient is aware that this  can will continue to work over the next 3 days.  We will also discharged with Zofran.   Encourage patient to decrease marijuana use and follow-up with her primary care doctor.  Return precautions discussed.  Expresses understanding and agreement with plan.   Dartha LodgeFord, Zohaib Heeney N, PA-C 12/20/18 0150    Paula LibraMolpus, John, MD 12/20/18 [phone]

## 2018-12-19 NOTE — ED Notes (Signed)
Bed: WA08 Expected date:  Expected time:  Means of arrival:  Comments: 

## 2018-12-19 NOTE — ED Triage Notes (Signed)
Pt reports dry cough started yesterday. Today started having vomiting

## 2018-12-19 NOTE — Discharge Instructions (Addendum)
You have been seen today for nausea, vomiting, and cough. Please read and follow all provided instructions.   1. Medications: usual home medications, cobalamin patch can be left on for 3 days, you can also use Zofran as needed for nausea 2. Treatment: rest, drink plenty of fluids 3. Follow Up: Please follow up with your primary doctor in 2 days for discussion of your diagnoses and further evaluation after today's visit; if you do not have a primary care doctor use the resource guide provided to find one; Please return to the ER for any new or worsening symptoms. Please obtain all of your results from medical records or have your doctors office obtain the results - share them with your doctor - you should be seen at your doctors office. Call today to arrange your follow up.   Take medications as prescribed. Please review all of the medicines and only take them if you do not have an allergy to them. Return to the emergency room for worsening condition or new concerning symptoms. Follow up with your regular doctor. If you don't have a regular doctor use one of the numbers below to establish a primary care doctor.  Please be aware that if you are taking birth control pills, taking other prescriptions, ESPECIALLY ANTIBIOTICS may make the birth control ineffective - if this is the case, either do not engage in sexual activity or use alternative methods of birth control such as condoms until you have finished the medicine and your family doctor says it is OK to restart them. If you are on a blood thinner such as COUMADIN, be aware that any other medicine that you take may cause the coumadin to either work too much, or not enough - you should have your coumadin level rechecked in next 7 days if this is the case.  ?  It is also a possibility that you have an allergic reaction to any of the medicines that you have been prescribed - Everybody reacts differently to medications and while MOST people have no trouble  with most medicines, you may have a reaction such as nausea, vomiting, rash, swelling, shortness of breath. If this is the case, please stop taking the medicine immediately and contact your physician.  ?  You should return to the ER if you develop severe or worsening symptoms.   Emergency Department Resource Guide 1) Find a Doctor and Pay Out of Pocket Although you won't have to find out who is covered by your insurance plan, it is a good idea to ask around and get recommendations. You will then need to call the office and see if the doctor you have chosen will accept you as a new patient and what types of options they offer for patients who are self-pay. Some doctors offer discounts or will set up payment plans for their patients who do not have insurance, but you will need to ask so you aren't surprised when you get to your appointment.  2) Contact Your Local Health Department Not all health departments have doctors that can see patients for sick visits, but many do, so it is worth a call to see if yours does. If you don't know where your local health department is, you can check in your phone book. The CDC also has a tool to help you locate your state's health department, and many state websites also have listings of all of their local health departments.  3) Find a Walk-in Clinic If your illness is not likely to  be very severe or complicated, you may want to try a walk in clinic. These are popping up all over the country in pharmacies, drugstores, and shopping centers. They're usually staffed by nurse practitioners or physician assistants that have been trained to treat common illnesses and complaints. They're usually fairly quick and inexpensive. However, if you have serious medical issues or chronic medical problems, these are probably not your best option.  No Primary Care Doctor: Call Health Connect at  [phone] - they can help you locate a primary care doctor that  accepts your insurance,  provides certain services, etc. Physician Referral [phone]  Emergency Department Resource Guide 1) Find a Doctor and Pay Out of Pocket Although you won't have to find out who is covered by your insurance plan, it is a good idea to ask around and get recommendations. You will then need to call the office and see if the doctor you have chosen will accept you as a new patient and what types of options they offer for patients who are self-pay. Some doctors offer discounts or will set up payment plans for their patients who do not have insurance, but you will need to ask so you aren't surprised when you get to your appointment.  2) Contact Your Local Health Department Not all health departments have doctors that can see patients for sick visits, but many do, so it is worth a call to see if yours does. If you don't know where your local health department is, you can check in your phone book. The CDC also has a tool to help you locate your state's health department, and many state websites also have listings of all of their local health departments.  3) Find a Walk-in Clinic If your illness is not likely to be very severe or complicated, you may want to try a walk in clinic. These are popping up all over the country in pharmacies, drugstores, and shopping centers. They're usually staffed by nurse practitioners or physician assistants that have been trained to treat common illnesses and complaints. They're usually fairly quick and inexpensive. However, if you have serious medical issues or chronic medical problems, these are probably not your best option.  No Primary Care Doctor: Call Health Connect at  [phone] - they can help you locate a primary care doctor that  accepts your insurance, provides certain services, etc. Physician Referral Service- [phone]  Chronic Pain Problems: Organization         Address  Phone   Notes  [facility]  [phone] Patients  need to be referred by their primary care doctor.   Medication Assistance: Organization         Address  Phone   Notes  [facility] Medication [facility] [address] [place], [place] [address] [phone] --Must be a resident of [facility] -- Must have NO insurance coverage whatsoever (no Medicaid/ Medicare, etc.) -- The pt. MUST have a primary care doctor that directs their care regularly and follows them in the community   [facility]  [phone]   [employer]  [phone]    Agencies that provide inexpensive medical care: Organization         Address  Phone   Notes  [facility]  [phone]   [facility]    [phone]   [facility] [address] [place], [place] [address] [phone]   [facility] [address], [place] [phone]   [facility]    [phone]) [phone]   [facility]    [phone]   [facility]  [address], [place] Phone:  [phone], Fax:  [phone]) [phone] Hours of Operation:  9 am - 6 pm, M-F.  Also accepts Medicaid/Medicare and self-pay.  [facility]  [address], [place] Phone: [phone]) [phone], Fax: [phone]) [phone]. Hours of Operation:  8:30 am - 5:30 pm, M-F.  Also accepts Medicaid and self-pay.  [facility] [place] [address], [place] Phone: [phone]) [phone]   [facility] [address], [place], [place] [phone], Ext. [phone] Mondays & Thursdays: 7-9 AM.  First 15 patients are seen on a first come, first serve basis.    Medicaid-accepting [facility] Providers:  Organization         Address  Phone   Notes  [facility] [address], [place] [phone] Also accepts self-pay patients.  [facility] [address], [place]  [phone]) [phone]     [facility] [address], [place] [phone]) [phone]   [facility] [address] [place] [address], [place] [phone]) [phone]   Renaye RakersVeita Bland [address], [place]   [phone] Only accepts [place] patients after they have their name applied to their card.   Self-Pay (no insurance) in [facility]:  Organization         Address  Phone   Notes  Sickle Cell Patients, [facility] [address], [place] [phone]   [facility] [address], [place] [phone]   [facility] [place]  [address], [place] [phone]   [facility]/Dr. Osei-Bonsu  [address] [place] [address], [place] or [phone], [place] [phone]) [phone] Phone number for both [place] and [place] locations is the same.  [facility] [address], [place] [phone]   [facility] [address] [place] [address], [place] or [address] [phone]) [phone]   [facility] [address], [place] [phone]) [phone], phone; [phone](336) [phone], fax Sees patients 1st and 3rd Saturday of every month.  Must not qualify for public or private insurance (i.e. Medicaid, Medicare, [place] Health Choice, Veterans' Benefits)  Household income should be no more than 200% of the poverty level The clinic cannot treat you if you are pregnant or think you are pregnant  Sexually transmitted diseases are not treated at the clinic.

## 2018-12-19 NOTE — ED Notes (Signed)
Pt drinking ginger ale in triage room and tolerating at this time.

## 2018-12-19 NOTE — ED Provider Notes (Signed)
[facility] Provider Note   CSN: [id number] Arrival date & time: 12/19/18  1716   History   Chief Complaint Chief Complaint  Patient presents with  . Emesis  . Cough    HPI [name] Weber is a 22 y.o. female with a PMH of Chlamydia, Trichomonas, and UTI presents with a dry cough onset yesterday and emesis onset today at 12pm. Patient reports she has vomited multiple times today. Patient was admitted with similar symptoms at the beginning of this month, and patient states symptoms completely resolved until today. Patient reports associated chills, epigastric abdominal pain, congestion, rhinorrhea, and sore throat. Patient denies fever. Patient reports sick exposure by her sister with similar symptoms. Patient states she last ate a [place] cheese steak yesterday at 7pm. Patient denies any abdominal surgeries. Patient states she drank 1 pint of alcohol yesterday and smoked marijuana yesterday. Patient denies any drug or alcohol use today.   HPI  Past Medical History:  Diagnosis Date  . Chlamydia infection   . Trichomonas 01/2012  . UTI (lower urinary tract infection)     Patient Active Problem List   Diagnosis Date Noted  . Cannabinoid hyperemesis syndrome (HCC) 11/22/2018  . Alcohol abuse 11/22/2018  . Intractable nausea and vomiting 11/21/2018  . Hypokalemia 11/21/2018  . NSVD (normal spontaneous vaginal delivery) 10/08/2014  . Active labor 10/06/2014  . Group B streptococcus urinary tract infection affecting pregnancy in second trimester, antepartum 07/07/2014  . Short interval between pregnancies affecting pregnancy in second trimester, antepartum 07/03/2014  . Sickle cell trait (HCC) 07/03/2014  . Chlamydia infection complicating pregnancy, antepartum 07/27/2013  . Young maternal age, antepartum 07/27/2013    Past Surgical History:  Procedure Laterality Date  . NO PAST SURGERIES       OB History    Gravida  4   Para  3   Term    3   Preterm      AB  1   Living  3     SAB      TAB  0   Ectopic      Multiple      Live Births  3            Home Medications    Prior to Admission medications   Medication Sig Start Date End Date Taking? Authorizing Provider  ondansetron (ZOFRAN ODT) 4 MG disintegrating tablet Take 1 tablet (4 mg total) by mouth every 8 (eight) hours as needed for nausea or vomiting. Patient not taking: Reported on 12/19/2018 11/21/18   Robinson, [place] N, PA-C    Family History Family History  Problem Relation Age of Onset  . Diabetes Maternal Aunt   . Alcohol abuse Neg Hx   . Arthritis Neg Hx   . Asthma Neg Hx   . Birth defects Neg Hx   . Cancer Neg Hx   . COPD Neg Hx   . Depression Neg Hx   . Drug abuse Neg Hx   . Early death Neg Hx   . Hearing loss Neg Hx   . Heart disease Neg Hx   . Hyperlipidemia Neg Hx   . Hypertension Neg Hx   . Kidney disease Neg Hx   . Learning disabilities Neg Hx   . Mental illness Neg Hx   . Mental retardation Neg Hx   . Miscarriages / Stillbirths Neg Hx   . Stroke Neg Hx   . Vision loss Neg Hx     Social  History Social History   Tobacco Use  . Smoking status: Current Every Day Smoker    Types: Cigarettes  . Smokeless tobacco: Never Used  Substance Use Topics  . Alcohol use: Yes  . Drug use: No     Allergies   Patient has no known allergies.   Review of Systems Review of Systems  Constitutional: Positive for chills. Negative for activity change, appetite change, fever and unexpected weight change.  HENT: Positive for congestion, rhinorrhea and sore throat.   Eyes: Negative for visual disturbance.  Respiratory: Positive for cough. Negative for shortness of breath.   Cardiovascular: Negative for chest pain.  Gastrointestinal: Positive for abdominal pain, nausea and vomiting. Negative for constipation and diarrhea.  Endocrine: Negative for polydipsia, polyphagia and polyuria.  Genitourinary: Negative for dysuria, flank  pain and frequency.  Musculoskeletal: Negative for back pain.  Skin: Negative for rash.  Allergic/Immunologic: Negative for immunocompromised state.  Neurological: Negative for dizziness and weakness.  Psychiatric/Behavioral: The patient is not nervous/anxious.      Physical Exam Updated Vital Signs BP 128/87 (BP Location: Left Arm)   Pulse 73   Temp 97.7 F (36.5 C) (Oral)   Resp 16   SpO2 100%   Physical Exam Vitals signs and nursing note reviewed.  Constitutional:      General: She is not in acute distress.    Appearance: She is well-developed. She is not diaphoretic.  HENT:     Head: Normocephalic and atraumatic.     Nose: Congestion and rhinorrhea present.     Mouth/Throat:     Mouth: Mucous membranes are dry.     Pharynx: Posterior oropharyngeal erythema present. No oropharyngeal exudate.  Neck:     Musculoskeletal: Normal range of motion and neck supple.  Cardiovascular:     Rate and Rhythm: Normal rate and regular rhythm.     Heart sounds: Normal heart sounds. No murmur. No friction rub. No gallop.   Pulmonary:     Effort: Pulmonary effort is normal. No respiratory distress.     Breath sounds: Normal breath sounds. No wheezing or rales.  Abdominal:     General: Bowel sounds are normal. There is no distension.     Palpations: Abdomen is soft. There is no mass.     Tenderness: There is abdominal tenderness in the epigastric area. There is no guarding or rebound.  Musculoskeletal: Normal range of motion.  Skin:    General: Skin is warm.     Findings: No erythema or rash.  Neurological:     Mental Status: She is alert and oriented to person, place, and time.      ED Treatments / Results  Labs (all labs ordered are listed, but only abnormal results are displayed) Labs Reviewed  COMPREHENSIVE METABOLIC PANEL - Abnormal; Notable for the following components:      Result Value   Potassium 3.2 (*)    CO2 21 (*)    Total Protein 8.2 (*)    Albumin 5.2 (*)     Anion gap 17 (*)    All other components within normal limits  CBC - Abnormal; Notable for the following components:   WBC 18.2 (*)    All other components within normal limits  URINALYSIS, ROUTINE W REFLEX MICROSCOPIC - Abnormal; Notable for the following components:   Hgb urine dipstick MODERATE (*)    Ketones, ur 80 (*)    Protein, ur 100 (*)    All other components within normal limits  URINE CULTURE  LIPASE, BLOOD  I-STAT BETA HCG BLOOD, ED ([facility], [facility], [facility] ONLY)    EKG None  Radiology No results found.  Procedures Procedures (including critical care time)  Medications Ordered in ED Medications  haloperidol lactate (HALDOL) injection 2 mg (has no administration in time range)  scopolamine (TRANSDERM-SCOP) 1 MG/3DAYS 1.5 mg (has no administration in time range)  sodium chloride 0.9 % bolus 1,000 mL (has no administration in time range)  potassium chloride SA (K-DUR,KLOR-CON) CR tablet 40 mEq (has no administration in time range)  dicyclomine (BENTYL) injection 20 mg (has no administration in time range)  ondansetron (ZOFRAN-ODT) disintegrating tablet 4 mg (4 mg Oral Given 12/19/18 1856)  sodium chloride 0.9 % bolus 1,000 mL (1,000 mLs Intravenous New Bag/Given 12/19/18 2205)  acetaminophen (TYLENOL) tablet 650 mg (650 mg Oral Given 12/19/18 2205)  ondansetron (ZOFRAN-ODT) disintegrating tablet 4 mg (4 mg Oral Given 12/19/18 2226)  metoCLOPramide (REGLAN) injection 10 mg (10 mg Intravenous Given 12/19/18 2308)     Initial Impression / Assessment and Plan / ED Course  I have reviewed the triage vital signs and the nursing notes.  Pertinent labs & imaging results that were available during my care of the patient were reviewed by me and considered in my medical decision making (see chart for details).  Clinical Course as of Dec 20 6  Sun Dec 19, 2018  2229 Hgb and RBCs noted on UA. Will order urine culture. Patient denies any urinary symptoms.  Hgb urine dipstickMarland Kitchen):  MODERATE [AH]  2235 Lipase is negative. Do not suspect pancreatitis at this time.   Lipase, blood [AH]  2235 Leukocytosis noted on 18.2. Patient has had chronic leukocytosis noted on previous labs.   WBC(!): 18.2 [AH]  2236 Hypokalemia noted at 3.2 likely due to vomiting. Will encourage patient to supplement with dietary modifications.  Potassium(!): 3.2 [AH]  2348 Patient is still vomiting and has epigastric tenderness. Will provide Haldol and order CT abdomen.    [AH]    Clinical Course User Index [AH] Leretha Dykes, PA-C   Patient presents with nausea, vomiting, sore throat, congestion, dry cough, and epigastric abdominal pain. Provided IVF. Provided Zofran and Reglan for nausea/vomiting. Patient has continued to have nausea and vomiting despite medications. Provided additional Haldol due to symptoms. CT abdomen is pending.    Findings and plan of care discussed with supervising physician Dr. Patria Mane.  At shift change care was transferred to [facility], PA-C who will follow pending studies, re-evaluate and determine disposition.    Final Clinical Impressions(s) / ED Diagnoses   Final diagnoses:  Nausea and vomiting in adult patient  Cough    ED Discharge Orders    None       Glade Stanford 12/20/18 0007    Azalia Bilis, MD 12/20/18 2340

## 2018-12-19 NOTE — ED Notes (Signed)
Pt aware urine sample is needed 

## 2018-12-20 ENCOUNTER — Emergency Department (HOSPITAL_COMMUNITY): Payer: Medicaid Other

## 2018-12-20 ENCOUNTER — Encounter (HOSPITAL_COMMUNITY): Payer: Self-pay

## 2018-12-20 MED ORDER — IOPAMIDOL (ISOVUE-300) INJECTION 61%
100.0000 mL | Freq: Once | INTRAVENOUS | Status: AC | PRN
  Administered 2018-12-20: 100 mL via INTRAVENOUS

## 2018-12-20 MED ORDER — SODIUM CHLORIDE (PF) 0.9 % IJ SOLN
INTRAMUSCULAR | Status: AC
  Filled 2018-12-20: qty 50

## 2018-12-20 MED ORDER — IOPAMIDOL (ISOVUE-300) INJECTION 61%
INTRAVENOUS | Status: AC
  Administered 2018-12-20: 100 mL via INTRAVENOUS
  Filled 2018-12-20: qty 100

## 2018-12-20 MED ORDER — DICYCLOMINE HCL 10 MG/ML IM SOLN
20.0000 mg | Freq: Once | INTRAMUSCULAR | Status: AC
  Administered 2018-12-20: 20 mg via INTRAMUSCULAR
  Filled 2018-12-20: qty 2

## 2018-12-20 MED ORDER — ONDANSETRON 4 MG PO TBDP: ORAL_TABLET | ORAL | 0 refills | Status: DC

## 2018-12-21 LAB — URINE CULTURE: Culture: <10000 — AB

## 2019-04-23 ENCOUNTER — Encounter (HOSPITAL_COMMUNITY): Payer: Self-pay

## 2019-04-23 ENCOUNTER — Other Ambulatory Visit: Payer: Self-pay

## 2019-04-23 ENCOUNTER — Emergency Department (HOSPITAL_COMMUNITY): Payer: Medicaid Other

## 2019-04-23 ENCOUNTER — Emergency Department (HOSPITAL_COMMUNITY)
Admission: EM | Admit: 2019-04-23 | Discharge: 2019-04-23 | Disposition: A | Discharge status: Home / Self Care | Payer: Medicaid Other | Attending: Emergency Medicine | Admitting: Emergency Medicine

## 2019-04-23 DIAGNOSIS — R1010 Upper abdominal pain, unspecified: Secondary | ICD-10-CM | POA: Diagnosis not present

## 2019-04-23 DIAGNOSIS — F17210 Nicotine dependence, cigarettes, uncomplicated: Secondary | ICD-10-CM | POA: Diagnosis not present

## 2019-04-23 DIAGNOSIS — E876 Hypokalemia: Secondary | ICD-10-CM | POA: Insufficient documentation

## 2019-04-23 LAB — COMPREHENSIVE METABOLIC PANEL
ALT: 22 U/L (ref 0–44)
AST: 28 U/L (ref 15–41)
Albumin: 5.3 g/dL — ABNORMAL HIGH (ref 3.5–5.0)
Alkaline Phosphatase: 50 U/L (ref 38–126)
Anion gap: 11 (ref 5–15)
BUN: 10 mg/dL (ref 6–20)
CO2: 23 mmol/L (ref 22–32)
Calcium: 9.6 mg/dL (ref 8.9–10.3)
Chloride: 103 mmol/L (ref 98–111)
Creatinine, Ser: 0.76 mg/dL (ref 0.44–1.00)
GFR calc Af Amer: >60 mL/min (ref >60)
GFR calc non Af Amer: >60 mL/min (ref >60)
Glucose, Bld: 165 mg/dL — ABNORMAL HIGH (ref 70–99)
Potassium: 3.2 mmol/L — ABNORMAL LOW (ref 3.5–5.1)
Sodium: 137 mmol/L (ref 135–145)
Total Bilirubin: 1.8 mg/dL — ABNORMAL HIGH (ref 0.3–1.2)
Total Protein: 8.6 g/dL — ABNORMAL HIGH (ref 6.5–8.1)

## 2019-04-23 LAB — LIPASE, BLOOD: Lipase: 21 U/L (ref 11–51)

## 2019-04-23 LAB — URINALYSIS, ROUTINE W REFLEX MICROSCOPIC
Bilirubin Urine: NEGATIVE
Glucose, UA: NEGATIVE mg/dL
Ketones, ur: 20 mg/dL — AB
Leukocytes,Ua: NEGATIVE
Nitrite: POSITIVE — AB
Protein, ur: NEGATIVE mg/dL
Specific Gravity, Urine: >1.046 — ABNORMAL HIGH (ref 1.005–1.030)
pH: 6 (ref 5.0–8.0)

## 2019-04-23 LAB — I-STAT BETA HCG BLOOD, ED (MC, WL, AP ONLY): I-stat hCG, quantitative: <5 m[IU]/mL (ref <5)

## 2019-04-23 LAB — CBC
HCT: 41.6 % (ref 36.0–46.0)
Hemoglobin: 14.4 g/dL (ref 12.0–15.0)
MCH: 28 pg (ref 26.0–34.0)
MCHC: 34.6 g/dL (ref 30.0–36.0)
MCV: 80.9 fL (ref 80.0–100.0)
Platelets: 292 10*3/uL (ref 150–400)
RBC: 5.14 MIL/uL — ABNORMAL HIGH (ref 3.87–5.11)
RDW: 13.7 % (ref 11.5–15.5)
WBC: 14.7 10*3/uL — ABNORMAL HIGH (ref 4.0–10.5)
nRBC: 0 % (ref 0.0–0.2)

## 2019-04-23 MED ORDER — POTASSIUM CHLORIDE CRYS ER 20 MEQ PO TBCR
40.0000 meq | EXTENDED_RELEASE_TABLET | Freq: Once | ORAL | Status: AC
  Administered 2019-04-23: 40 meq via ORAL
  Filled 2019-04-23: qty 2

## 2019-04-23 MED ORDER — SODIUM CHLORIDE 0.9 % IV SOLN: INTRAVENOUS | Status: DC

## 2019-04-23 MED ORDER — POTASSIUM CHLORIDE CRYS ER 20 MEQ PO TBCR
40.0000 meq | EXTENDED_RELEASE_TABLET | Freq: Once | ORAL | Status: AC
  Administered 2019-04-23: 20:00:00 40 meq via ORAL
  Filled 2019-04-23: qty 2

## 2019-04-23 MED ORDER — PANTOPRAZOLE SODIUM 20 MG PO TBEC: 20.0000 mg | DELAYED_RELEASE_TABLET | Freq: Every day | ORAL | 0 refills | Status: DC

## 2019-04-23 MED ORDER — SODIUM CHLORIDE 0.9 % IV BOLUS
2000.0000 mL | Freq: Once | INTRAVENOUS | Status: AC
  Administered 2019-04-23: 2000 mL via INTRAVENOUS

## 2019-04-23 MED ORDER — SODIUM CHLORIDE 0.9% FLUSH
3.0000 mL | Freq: Once | INTRAVENOUS | Status: AC
  Administered 2019-04-23: 3 mL via INTRAVENOUS

## 2019-04-23 MED ORDER — PANTOPRAZOLE SODIUM 40 MG IV SOLR
40.0000 mg | Freq: Once | INTRAVENOUS | Status: AC
  Administered 2019-04-23: 17:00:00 40 mg via INTRAVENOUS
  Filled 2019-04-23: qty 40

## 2019-04-23 MED ORDER — IOHEXOL 300 MG/ML  SOLN
100.0000 mL | Freq: Once | INTRAMUSCULAR | Status: AC | PRN
  Administered 2019-04-23: 100 mL via INTRAVENOUS

## 2019-04-23 MED ORDER — MORPHINE SULFATE (PF) 4 MG/ML IV SOLN
4.0000 mg | Freq: Once | INTRAVENOUS | Status: AC
  Administered 2019-04-23: 4 mg via INTRAVENOUS
  Filled 2019-04-23: qty 1

## 2019-04-23 MED ORDER — LORAZEPAM 2 MG/ML IJ SOLN
0.5000 mg | Freq: Once | INTRAMUSCULAR | Status: AC
  Administered 2019-04-23: 0.5 mg via INTRAVENOUS
  Filled 2019-04-23: qty 1

## 2019-04-23 MED ORDER — ONDANSETRON HCL 4 MG/2ML IJ SOLN
4.0000 mg | Freq: Once | INTRAMUSCULAR | Status: AC
  Administered 2019-04-23: 4 mg via INTRAVENOUS
  Filled 2019-04-23: qty 2

## 2019-04-23 MED ORDER — SODIUM CHLORIDE (PF) 0.9 % IJ SOLN
INTRAMUSCULAR | Status: AC
  Filled 2019-04-23: qty 50

## 2019-04-23 NOTE — ED Provider Notes (Signed)
[facility]-EMERGENCY DEPT Provider Note   CSN: [id number] Arrival date & time: 04/23/19  1617    History   Chief Complaint Chief Complaint  Patient presents with  . Abdominal Pain    HPI [name] Weber is a 23 y.o. female.     23 year old female presents with nonbilious emesis x3 days.  No fever or chills.  No hematemesis.  Slight watery diarrhea.  She is currently on her menstrual cycle and denies any dysuria.  History of similar symptoms in the past which she relates to alcohol use.  Also reviewed old chart shows that she has a visit for cannabis hyperemesis.  Her current symptoms are persistent and are worse when she tries to eat.  Nothing makes them better.     Past Medical History:  Diagnosis Date  . Chlamydia infection   . Trichomonas 01/2012  . UTI (lower urinary tract infection)     Patient Active Problem List   Diagnosis Date Noted  . Cannabinoid hyperemesis syndrome (HCC) 11/22/2018  . Alcohol abuse 11/22/2018  . Intractable nausea and vomiting 11/21/2018  . Hypokalemia 11/21/2018  . NSVD (normal spontaneous vaginal delivery) 10/08/2014  . Active labor 10/06/2014  . Group B streptococcus urinary tract infection affecting pregnancy in second trimester, antepartum 07/07/2014  . Short interval between pregnancies affecting pregnancy in second trimester, antepartum 07/03/2014  . Sickle cell trait (HCC) 07/03/2014  . Chlamydia infection complicating pregnancy, antepartum 07/27/2013  . Young maternal age, antepartum 07/27/2013    Past Surgical History:  Procedure Laterality Date  . NO PAST SURGERIES       OB History    Gravida  4   Para  3   Term  3   Preterm      AB  1   Living  3     SAB      TAB  0   Ectopic      Multiple      Live Births  3            Home Medications    Prior to Admission medications   Medication Sig Start Date End Date Taking? Authorizing Provider  ondansetron (ZOFRAN ODT) 4 MG  disintegrating tablet 4mg  ODT q4 hours prn nausea/vomit 12/20/18   Dartha Lodge, PA-C    Family History Family History  Problem Relation Age of Onset  . Diabetes Maternal Aunt   . Alcohol abuse Neg Hx   . Arthritis Neg Hx   . Asthma Neg Hx   . Birth defects Neg Hx   . Cancer Neg Hx   . COPD Neg Hx   . Depression Neg Hx   . Drug abuse Neg Hx   . Early death Neg Hx   . Hearing loss Neg Hx   . Heart disease Neg Hx   . Hyperlipidemia Neg Hx   . Hypertension Neg Hx   . Kidney disease Neg Hx   . Learning disabilities Neg Hx   . Mental illness Neg Hx   . Mental retardation Neg Hx   . Miscarriages / Stillbirths Neg Hx   . Stroke Neg Hx   . Vision loss Neg Hx     Social History Social History   Tobacco Use  . Smoking status: Current Every Day Smoker    Types: Cigarettes  . Smokeless tobacco: Never Used  Substance Use Topics  . Alcohol use: Yes  . Drug use: No     Allergies   Patient has  no known allergies.   Review of Systems Review of Systems  All other systems reviewed and are negative.    Physical Exam Updated Vital Signs BP (!) 138/99 (BP Location: Right Arm)   Pulse 71   Temp 98.5 F (36.9 C) (Oral)   Resp 18   Ht 1.6 m (5\' 3" )   Wt 54.4 kg   SpO2 100%   BMI 21.26 kg/m   Physical Exam Vitals signs and nursing note reviewed.  Constitutional:      General: She is not in acute distress.    Appearance: Normal appearance. She is well-developed. She is not toxic-appearing.  HENT:     Head: Normocephalic and atraumatic.  Eyes:     General: Lids are normal.     Conjunctiva/sclera: Conjunctivae normal.     Pupils: Pupils are equal, round, and reactive to light.  Neck:     Musculoskeletal: Normal range of motion and neck supple.     Thyroid: No thyroid mass.     Trachea: No tracheal deviation.  Cardiovascular:     Rate and Rhythm: Normal rate and regular rhythm.     Heart sounds: Normal heart sounds. No murmur. No gallop.   Pulmonary:      Effort: Pulmonary effort is normal. No respiratory distress.     Breath sounds: Normal breath sounds. No stridor. No decreased breath sounds, wheezing, rhonchi or rales.  Abdominal:     General: Bowel sounds are normal. There is no distension.     Palpations: Abdomen is soft.     Tenderness: There is generalized abdominal tenderness and tenderness in the epigastric area. There is no guarding or rebound.    Musculoskeletal: Normal range of motion.        General: No tenderness.  Skin:    General: Skin is warm and dry.     Findings: No abrasion or rash.  Neurological:     Mental Status: She is alert and oriented to person, place, and time.     GCS: GCS eye subscore is 4. GCS verbal subscore is 5. GCS motor subscore is 6.     Cranial Nerves: No cranial nerve deficit.     Sensory: No sensory deficit.  Psychiatric:        Speech: Speech normal.        Behavior: Behavior normal.      ED Treatments / Results  Labs (all labs ordered are listed, but only abnormal results are displayed) Labs Reviewed  LIPASE, BLOOD  COMPREHENSIVE METABOLIC PANEL  CBC  URINALYSIS, ROUTINE W REFLEX MICROSCOPIC  I-STAT BETA HCG BLOOD, ED ([facility], [facility], [facility] ONLY)    EKG None  Radiology No results found.  Procedures Procedures (including critical care time)  Medications Ordered in ED Medications  sodium chloride flush (NS) 0.9 % injection 3 mL (has no administration in time range)  sodium chloride 0.9 % bolus 2,000 mL (has no administration in time range)  0.9 %  sodium chloride infusion (has no administration in time range)  LORazepam (ATIVAN) injection 0.5 mg (has no administration in time range)  morphine 4 MG/ML injection 4 mg (has no administration in time range)  ondansetron (ZOFRAN) injection 4 mg (has no administration in time range)  pantoprazole (PROTONIX) injection 40 mg (has no administration in time range)     Initial Impression / Assessment and Plan / ED Course  I have reviewed the  triage vital signs and the nursing notes.  Pertinent labs & imaging results that were available  during my care of the patient were reviewed by me and considered in my medical decision making (see chart for details).        Patient with mild hypokalemia treated with oral potassium 40 mEq.  Leukocytosis noted on her CBC and she subsequently had a CT of her abdomen which did not have any acute findings.  Her pain is controlled this time.  Patient does admit to moderate use of marijuana and suspect that is some component to this.  Will discharge home  Final Clinical Impressions(s) / ED Diagnoses   Final diagnoses:  None    ED Discharge Orders    None       Lorre Nick, MD 04/23/19 1933

## 2019-04-23 NOTE — ED Notes (Signed)
Patient transported to CT 

## 2019-04-23 NOTE — ED Triage Notes (Signed)
States abdominal pain and nausea/vomiting x2 days no diarrhea no dysuria voiced. No fever voiced.

## 2019-04-23 NOTE — ED Notes (Signed)
Pt provided ice, per MD request.

## 2019-06-20 ENCOUNTER — Encounter (HOSPITAL_COMMUNITY): Payer: Self-pay | Admitting: Emergency Medicine

## 2019-06-20 ENCOUNTER — Other Ambulatory Visit: Payer: Self-pay

## 2019-06-20 ENCOUNTER — Emergency Department (HOSPITAL_COMMUNITY)
Admission: EM | Admit: 2019-06-20 | Discharge: 2019-06-21 | Disposition: A | Discharge status: Home / Self Care | Payer: Medicaid Other | Attending: Emergency Medicine | Admitting: Emergency Medicine

## 2019-06-20 DIAGNOSIS — M25562 Pain in left knee: Secondary | ICD-10-CM | POA: Insufficient documentation

## 2019-06-20 DIAGNOSIS — R1013 Epigastric pain: Secondary | ICD-10-CM | POA: Diagnosis not present

## 2019-06-20 DIAGNOSIS — F17210 Nicotine dependence, cigarettes, uncomplicated: Secondary | ICD-10-CM | POA: Diagnosis not present

## 2019-06-20 DIAGNOSIS — D573 Sickle-cell trait: Secondary | ICD-10-CM | POA: Diagnosis not present

## 2019-06-20 DIAGNOSIS — R197 Diarrhea, unspecified: Secondary | ICD-10-CM | POA: Insufficient documentation

## 2019-06-20 DIAGNOSIS — R112 Nausea with vomiting, unspecified: Secondary | ICD-10-CM | POA: Insufficient documentation

## 2019-06-20 LAB — URINALYSIS, ROUTINE W REFLEX MICROSCOPIC
Bilirubin Urine: NEGATIVE
Glucose, UA: 150 mg/dL — AB
Ketones, ur: 80 mg/dL — AB
Nitrite: NEGATIVE
Protein, ur: 100 mg/dL — AB
Specific Gravity, Urine: 1.033 — ABNORMAL HIGH (ref 1.005–1.030)
pH: 6 (ref 5.0–8.0)

## 2019-06-20 LAB — COMPREHENSIVE METABOLIC PANEL
ALT: 23 U/L (ref 0–44)
AST: 25 U/L (ref 15–41)
Albumin: 5.1 g/dL — ABNORMAL HIGH (ref 3.5–5.0)
Alkaline Phosphatase: 43 U/L (ref 38–126)
Anion gap: 13 (ref 5–15)
BUN: 12 mg/dL (ref 6–20)
CO2: 22 mmol/L (ref 22–32)
Calcium: 9.9 mg/dL (ref 8.9–10.3)
Chloride: 104 mmol/L (ref 98–111)
Creatinine, Ser: 0.72 mg/dL (ref 0.44–1.00)
GFR calc Af Amer: >60 mL/min (ref >60)
GFR calc non Af Amer: >60 mL/min (ref >60)
Glucose, Bld: 164 mg/dL — ABNORMAL HIGH (ref 70–99)
Potassium: 3.7 mmol/L (ref 3.5–5.1)
Sodium: 139 mmol/L (ref 135–145)
Total Bilirubin: 1.3 mg/dL — ABNORMAL HIGH (ref 0.3–1.2)
Total Protein: 8.6 g/dL — ABNORMAL HIGH (ref 6.5–8.1)

## 2019-06-20 LAB — I-STAT BETA HCG BLOOD, ED (MC, WL, AP ONLY): I-stat hCG, quantitative: <5 m[IU]/mL (ref <5)

## 2019-06-20 LAB — CBC
HCT: 39 % (ref 36.0–46.0)
Hemoglobin: 13.4 g/dL (ref 12.0–15.0)
MCH: 27.4 pg (ref 26.0–34.0)
MCHC: 34.4 g/dL (ref 30.0–36.0)
MCV: 79.8 fL — ABNORMAL LOW (ref 80.0–100.0)
Platelets: 264 10*3/uL (ref 150–400)
RBC: 4.89 MIL/uL (ref 3.87–5.11)
RDW: 13.3 % (ref 11.5–15.5)
WBC: 21 10*3/uL — ABNORMAL HIGH (ref 4.0–10.5)
nRBC: 0 % (ref 0.0–0.2)

## 2019-06-20 LAB — LIPASE, BLOOD: Lipase: 21 U/L (ref 11–51)

## 2019-06-20 MED ORDER — ONDANSETRON 4 MG PO TBDP: 4.0000 mg | ORAL_TABLET | Freq: Once | ORAL | Status: DC | PRN

## 2019-06-20 MED ORDER — FAMOTIDINE IN NACL 20-0.9 MG/50ML-% IV SOLN
20.0000 mg | INTRAVENOUS | Status: AC
  Administered 2019-06-21: 20 mg via INTRAVENOUS
  Filled 2019-06-20: qty 50

## 2019-06-20 MED ORDER — SODIUM CHLORIDE 0.9 % IV BOLUS
1000.0000 mL | Freq: Once | INTRAVENOUS | Status: AC
  Administered 2019-06-21: 1000 mL via INTRAVENOUS

## 2019-06-20 MED ORDER — SUCRALFATE 1 G PO TABS
1.0000 g | ORAL_TABLET | Freq: Once | ORAL | Status: AC
  Administered 2019-06-21: 1 g via ORAL
  Filled 2019-06-20: qty 1

## 2019-06-20 MED ORDER — ONDANSETRON HCL 4 MG/2ML IJ SOLN
4.0000 mg | Freq: Once | INTRAMUSCULAR | Status: AC
  Administered 2019-06-21: 4 mg via INTRAVENOUS
  Filled 2019-06-20: qty 2

## 2019-06-20 NOTE — ED Triage Notes (Signed)
Pt c/o v/d that started today. Fell last night when tripped and c/o pains in left leg.

## 2019-06-20 NOTE — ED Provider Notes (Signed)
[facility]-EMERGENCY DEPT Provider Note   CSN: [id number] Arrival date & time: 06/20/19  1751     History   Chief Complaint Chief Complaint  Patient presents with  . Emesis  . Diarrhea  . Fall  . Leg Pain    HPI [name] Weber is a [age] y.o. female.     The history is provided by the patient and medical records.  Emesis Associated symptoms: arthralgias and diarrhea   Diarrhea Associated symptoms: arthralgias and vomiting   Fall  Leg Pain    23 year old female with history of cannabinoid hyperemesis, sickle cell trait, presenting to the ED with abdominal pain, nausea, and vomiting.  Patient reports she was drinking somewhat heavily yesterday with friends, states mostly liquor.  States when she woke up this morning she was having diffuse abdominal pain but worse in the epigastric region.  States she has had repetitive nausea and vomiting, some mild diarrhea.  She denies any marijuana use in the past few days.  No fevers or chills.  No changes in diet.  No sick contacts.  She is tried drinking water throughout the day but cannot hold it down.  She also reports a fall last night while intoxicated.  States she fell onto the left knee.  She is able to ambulate but states she has a great deal of pain in the left knee when doing so.  No numbness or weakness of the leg.    No medications tried at home for any of her symptoms.  Past Medical History:  Diagnosis Date  . Chlamydia infection   . Trichomonas 01/2012  . UTI (lower urinary tract infection)     Patient Active Problem List   Diagnosis Date Noted  . Cannabinoid hyperemesis syndrome (HCC) 11/22/2018  . Alcohol abuse 11/22/2018  . Intractable nausea and vomiting 11/21/2018  . Hypokalemia 11/21/2018  . NSVD (normal spontaneous vaginal delivery) 10/08/2014  . Active labor 10/06/2014  . Group B streptococcus urinary tract infection affecting pregnancy in second trimester, antepartum 07/07/2014  .  Short interval between pregnancies affecting pregnancy in second trimester, antepartum 07/03/2014  . Sickle cell trait (HCC) 07/03/2014  . Chlamydia infection complicating pregnancy, antepartum 07/27/2013  . Young maternal age, antepartum 07/27/2013    Past Surgical History:  Procedure Laterality Date  . NO PAST SURGERIES       OB History    Gravida  4   Para  3   Term  3   Preterm      AB  1   Living  3     SAB      TAB  0   Ectopic      Multiple      Live Births  3            Home Medications    Prior to Admission medications   Medication Sig Start Date End Date Taking? Authorizing Provider  acetaminophen (TYLENOL) 500 MG tablet Take 500 mg by mouth every 6 (six) hours as needed for moderate pain.   Yes [provider]  ondansetron (ZOFRAN ODT) 4 MG disintegrating tablet 4mg  ODT q4 hours prn nausea/vomit Patient not taking: Reported on 04/23/2019 12/20/18   Dartha LodgeFord, Kelsey N, PA-C  pantoprazole (PROTONIX) 20 MG tablet Take 1 tablet (20 mg total) by mouth daily. Patient not taking: Reported on 06/20/2019 04/23/19   Lorre NickAllen, Anthony, MD    Family History Family History  Problem Relation Age of Onset  . Diabetes Maternal Aunt   .  Alcohol abuse Neg Hx   . Arthritis Neg Hx   . Asthma Neg Hx   . Birth defects Neg Hx   . Cancer Neg Hx   . COPD Neg Hx   . Depression Neg Hx   . Drug abuse Neg Hx   . Early death Neg Hx   . Hearing loss Neg Hx   . Heart disease Neg Hx   . Hyperlipidemia Neg Hx   . Hypertension Neg Hx   . Kidney disease Neg Hx   . Learning disabilities Neg Hx   . Mental illness Neg Hx   . Mental retardation Neg Hx   . Miscarriages / Stillbirths Neg Hx   . Stroke Neg Hx   . Vision loss Neg Hx     Social History Social History   Tobacco Use  . Smoking status: Current Every Day Smoker    Types: Cigarettes  . Smokeless tobacco: Never Used  Substance Use Topics  . Alcohol use: Yes  . Drug use: No     Allergies   Patient has  no known allergies.   Review of Systems Review of Systems  Gastrointestinal: Positive for diarrhea, nausea and vomiting.  Musculoskeletal: Positive for arthralgias.  All other systems reviewed and are negative.    Physical Exam Updated Vital Signs BP (!) 136/95 (BP Location: Right Arm)   Pulse 77   Temp 98.2 F (36.8 C) (Oral)   Resp 16   LMP 05/01/2019   SpO2 100%   Physical Exam Vitals signs and nursing note reviewed.  Constitutional:      Appearance: She is well-developed.  HENT:     Head: Normocephalic and atraumatic.  Eyes:     Conjunctiva/sclera: Conjunctivae normal.     Pupils: Pupils are equal, round, and reactive to light.  Neck:     Musculoskeletal: Normal range of motion.  Cardiovascular:     Rate and Rhythm: Normal rate and regular rhythm.     Heart sounds: Normal heart sounds.  Pulmonary:     Effort: Pulmonary effort is normal.     Breath sounds: Normal breath sounds.  Abdominal:     General: Bowel sounds are normal.     Palpations: Abdomen is soft.     Tenderness: There is no abdominal tenderness. There is no guarding or rebound.     Comments: Reports generalized abdominal pain but no focal tenderness or peritoneal signs, bowel sounds are normal, no distention  Musculoskeletal: Normal range of motion.     Comments: Left knee with tenderness and very mild swelling just inferior to the patella, there is no acute deformity, patient was observed to be ambulating with steady gait independently in the ED  Skin:    General: Skin is warm and dry.  Neurological:     Mental Status: She is alert and oriented to person, place, and time.      ED Treatments / Results  Labs (all labs ordered are listed, but only abnormal results are displayed) Labs Reviewed  COMPREHENSIVE METABOLIC PANEL - Abnormal; Notable for the following components:      Result Value   Glucose, Bld 164 (*)    Total Protein 8.6 (*)    Albumin 5.1 (*)    Total Bilirubin 1.3 (*)    All  other components within normal limits  CBC - Abnormal; Notable for the following components:   WBC 21.0 (*)    MCV 79.8 (*)    All other components within normal limits  URINALYSIS,  ROUTINE W REFLEX MICROSCOPIC - Abnormal; Notable for the following components:   Specific Gravity, Urine 1.033 (*)    Glucose, UA 150 (*)    Hgb urine dipstick MODERATE (*)    Ketones, ur 80 (*)    Protein, ur 100 (*)    Leukocytes,Ua TRACE (*)    Bacteria, UA RARE (*)    All other components within normal limits  LIPASE, BLOOD  I-STAT BETA HCG BLOOD, ED ([facility], [facility], [facility] ONLY)    EKG    Radiology No results found.  Procedures Procedures (including critical care time)  Medications Ordered in ED Medications  ondansetron (ZOFRAN-ODT) disintegrating tablet 4 mg (has no administration in time range)  sodium chloride 0.9 % bolus 1,000 mL (0 mLs Intravenous Stopped 06/21/19 0144)  famotidine (PEPCID) IVPB 20 mg premix (0 mg Intravenous Stopped 06/21/19 0144)  ondansetron (ZOFRAN) injection 4 mg (4 mg Intravenous Given 06/21/19 0044)  sucralfate (CARAFATE) tablet 1 g (1 g Oral Given 06/21/19 0043)  ketorolac (TORADOL) 30 MG/ML injection 30 mg (30 mg Intravenous Given 06/21/19 0234)  metoCLOPramide (REGLAN) injection 10 mg (10 mg Intravenous Given 06/21/19 0234)  haloperidol lactate (HALDOL) injection 2 mg (2 mg Intravenous Given 06/21/19 0400)  sodium chloride 0.9 % bolus 1,000 mL (1,000 mLs Intravenous New Bag/Given 06/21/19 0400)     Initial Impression / Assessment and Plan / ED Course  I have reviewed the triage vital signs and the nursing notes.  Pertinent labs & imaging results that were available during my care of the patient were reviewed by me and considered in my medical decision making (see chart for details).  23 year old female here with nausea, vomiting, and diarrhea since this morning.  Reports she had fairly heavy alcohol intake yesterday and has had symptoms since.  She also reports a fall while  intoxicated and injured the left knee.  She remains ambulatory but with some pain.  She is afebrile and nontoxic in appearance here.  She reports generalized discomfort but no focal tenderness or peritoneal signs.  Labs were obtained from triage which do reveal leukocytosis.  It does appear that patient generally has leukocytosis after heavy emesis, this may just be reactive.  UA with ketones but no signs of infection.  Will give IV fluids and antiemetics.  Patient does have history of cannabinoid induced hyperemesis, she denies any recent marijuana use in the past several days.  Had a CT scan in May 2020 that was negative.  Do not feel she needs emergent repeat CT at this time.  Will monitor.  Patient with emesis after initial meds, states no relief.  Will try Toradol and Reglan.  3:36 AM Continues vomiting after second round of meds.  Will try haldol, given additional liter of fluid.  5:34 AM Patient feeling much better after second liter of fluids and small dose of IV Haldol.  She has not had any further emesis.  She has been able to tolerate a few sips of ginger ale without issue.  Feels like she can manage at home.  Antiemetics sent to her pharmacy, encouraged to avoid alcohol for the next few days, gentle diet and progress as tolerated.  Close follow-up with PCP.  Return here for any new or acute changes.  Final Clinical Impressions(s) / ED Diagnoses   Final diagnoses:  Nausea vomiting and diarrhea    ED Discharge Orders         Ordered    ondansetron (ZOFRAN ODT) 4 MG disintegrating tablet  Every 8  hours PRN     06/21/19 0536           Garlon HatchetSanders, Aily Tzeng M, PA-C 06/21/19 0541    Nira Connardama, Pedro Eduardo, MD 06/21/19 [phone]

## 2019-06-21 ENCOUNTER — Emergency Department (HOSPITAL_COMMUNITY): Payer: Medicaid Other

## 2019-06-21 ENCOUNTER — Other Ambulatory Visit: Payer: Self-pay

## 2019-06-21 ENCOUNTER — Emergency Department (HOSPITAL_COMMUNITY)
Admission: EM | Admit: 2019-06-21 | Discharge: 2019-06-22 | Disposition: A | Discharge status: Home / Self Care | Payer: Medicaid Other | Source: Home / Self Care | Attending: Emergency Medicine | Admitting: Emergency Medicine

## 2019-06-21 ENCOUNTER — Encounter (HOSPITAL_COMMUNITY): Payer: Self-pay

## 2019-06-21 DIAGNOSIS — T50905A Adverse effect of unspecified drugs, medicaments and biological substances, initial encounter: Secondary | ICD-10-CM

## 2019-06-21 MED ORDER — LORAZEPAM 2 MG/ML IJ SOLN
0.5000 mg | Freq: Once | INTRAMUSCULAR | Status: AC
  Administered 2019-06-21: 0.5 mg via INTRAVENOUS
  Filled 2019-06-21: qty 1

## 2019-06-21 MED ORDER — ONDANSETRON 4 MG PO TBDP: 4.0000 mg | ORAL_TABLET | Freq: Three times a day (TID) | ORAL | 0 refills | Status: DC | PRN

## 2019-06-21 MED ORDER — SODIUM CHLORIDE 0.9 % IV BOLUS
1000.0000 mL | Freq: Once | INTRAVENOUS | Status: AC
  Administered 2019-06-21: 1000 mL via INTRAVENOUS

## 2019-06-21 MED ORDER — KETOROLAC TROMETHAMINE 30 MG/ML IJ SOLN
30.0000 mg | Freq: Once | INTRAMUSCULAR | Status: AC
  Administered 2019-06-21: 30 mg via INTRAVENOUS
  Filled 2019-06-21: qty 1

## 2019-06-21 MED ORDER — DIPHENHYDRAMINE HCL 50 MG/ML IJ SOLN
25.0000 mg | Freq: Once | INTRAMUSCULAR | Status: AC
  Administered 2019-06-21: 25 mg via INTRAVENOUS
  Filled 2019-06-21: qty 1

## 2019-06-21 MED ORDER — HALOPERIDOL LACTATE 5 MG/ML IJ SOLN
2.0000 mg | Freq: Once | INTRAMUSCULAR | Status: AC
  Administered 2019-06-21: 2 mg via INTRAVENOUS
  Filled 2019-06-21: qty 1

## 2019-06-21 MED ORDER — METOCLOPRAMIDE HCL 5 MG/ML IJ SOLN
10.0000 mg | Freq: Once | INTRAMUSCULAR | Status: AC
  Administered 2019-06-21: 10 mg via INTRAVENOUS
  Filled 2019-06-21: qty 2

## 2019-06-21 NOTE — ED Notes (Signed)
Gave pt hot pack for pain.

## 2019-06-21 NOTE — ED Notes (Signed)
Pt is yelling out in pain, saying her back is stiffening and she is hot. She is visibly sweating. Gave her cool wash clothes and a cup of ice water.

## 2019-06-21 NOTE — ED Notes (Signed)
Pt has called out 5-6 times. She told me that she has extreme jaw pain, and it's radiating to the left side of her back. She wants to know when a provider will see her. Made provider aware.

## 2019-06-21 NOTE — ED Triage Notes (Addendum)
Pt BIB EMS from home. Pt started having tongue swelling since 1pm. Pt was seen here yesterday and given medication while here. Pt denies taking any medication at home. EMS reports swelling is isolated and not effecting her airway. Pts tongue does not appear swollen at this time.  18G LAC HR 110 140/90 100% RA

## 2019-06-21 NOTE — ED Notes (Signed)
Patient given 30 cc of water.

## 2019-06-21 NOTE — ED Notes (Signed)
Pt transported to xray 

## 2019-06-21 NOTE — ED Provider Notes (Signed)
[facility] DEPT Provider Note   CSN: [id number] Arrival date & time: 06/21/19  2841     History   Chief Complaint Chief Complaint  Patient presents with  . Oral Swelling    HPI [name] Weber is a 23 y.o. female.     Patient to ED with complaint of facial swelling and jaw pain and stiffness that started around 1;00 pm this afternoon. She was seen over night in the ED, discharged 6 hours prior to onset of current symptoms, for nausea and vomiting. She reports these symptoms have completely resolved. Per chart review, she was given Reglan, Haldol, Zofran for nausea. She denies tongue swelling, difficulty swallowing or breathing, rash. She states that over time her low back is starting for feel painful and stiff like her jaw. No fever.   The history is provided by the patient. No language interpreter was used.    Past Medical History:  Diagnosis Date  . Chlamydia infection   . Trichomonas 01/2012  . UTI (lower urinary tract infection)     Patient Active Problem List   Diagnosis Date Noted  . Cannabinoid hyperemesis syndrome ([place]) 11/22/2018  . Alcohol abuse 11/22/2018  . Intractable nausea and vomiting 11/21/2018  . Hypokalemia 11/21/2018  . NSVD (normal spontaneous vaginal delivery) 10/08/2014  . Active labor 10/06/2014  . Group B streptococcus urinary tract infection affecting pregnancy in second trimester, antepartum 07/07/2014  . Short interval between pregnancies affecting pregnancy in second trimester, antepartum 07/03/2014  . Sickle cell trait ([place]) 07/03/2014  . Chlamydia infection complicating pregnancy, antepartum 07/27/2013  . Young maternal age, antepartum 07/27/2013    Past Surgical History:  Procedure Laterality Date  . NO PAST SURGERIES       OB History    Gravida  4   Para  3   Term  3   Preterm      AB  1   Living  3     SAB      TAB  0   Ectopic      Multiple      Live Births  3            Home Medications    Prior to Admission medications   Medication Sig Start Date End Date Taking? Authorizing Provider  acetaminophen (TYLENOL) 500 MG tablet Take 500 mg by mouth every 6 (six) hours as needed for moderate pain.   Yes [provider]  ondansetron (ZOFRAN ODT) 4 MG disintegrating tablet Take 1 tablet (4 mg total) by mouth every 8 (eight) hours as needed for nausea. 06/21/19   Larene Pickett, PA-C  pantoprazole (PROTONIX) 20 MG tablet Take 1 tablet (20 mg total) by mouth daily. Patient not taking: Reported on 06/20/2019 04/23/19   Lacretia Leigh, MD    Family History Family History  Problem Relation Age of Onset  . Diabetes Maternal Aunt   . Alcohol abuse Neg Hx   . Arthritis Neg Hx   . Asthma Neg Hx   . Birth defects Neg Hx   . Cancer Neg Hx   . COPD Neg Hx   . Depression Neg Hx   . Drug abuse Neg Hx   . Early death Neg Hx   . Hearing loss Neg Hx   . Heart disease Neg Hx   . Hyperlipidemia Neg Hx   . Hypertension Neg Hx   . Kidney disease Neg Hx   . Learning disabilities Neg Hx   . Mental  illness Neg Hx   . Mental retardation Neg Hx   . Miscarriages / Stillbirths Neg Hx   . Stroke Neg Hx   . Vision loss Neg Hx     Social History Social History   Tobacco Use  . Smoking status: Current Every Day Smoker    Types: Cigarettes  . Smokeless tobacco: Never Used  Substance Use Topics  . Alcohol use: Yes  . Drug use: No     Allergies   Patient has no known allergies.   Review of Systems Review of Systems  Constitutional: Negative for fever.  HENT: Positive for facial swelling. Negative for sore throat and trouble swallowing.        See HPI.  Respiratory: Negative for shortness of breath, wheezing and stridor.   Gastrointestinal: Negative for nausea and vomiting.  Musculoskeletal: Positive for back pain.  Skin: Negative for rash.     Physical Exam Updated Vital Signs BP 130/88   Pulse 87   Temp 98.3 F (36.8 C) (Oral)   Resp 17   Ht 5'  3" (1.6 m)   Wt 54.4 kg   SpO2 100%   BMI 21.24 kg/m   Physical Exam Constitutional:      General: She is not in acute distress.    Appearance: Normal appearance. She is well-developed.  HENT:     Head: Normocephalic.     Comments: No facial swelling. There is jaw movement but range is decreased. No tongue swelling, oropharynx is benign.     Mouth/Throat:     Mouth: Mucous membranes are moist.  Eyes:     Conjunctiva/sclera: Conjunctivae normal.  Neck:     Musculoskeletal: Normal range of motion and neck supple. No neck rigidity.  Cardiovascular:     Rate and Rhythm: Normal rate and regular rhythm.  Pulmonary:     Effort: Pulmonary effort is normal.     Breath sounds: Normal breath sounds. No stridor. No wheezing.  Abdominal:     General: Bowel sounds are normal.     Palpations: Abdomen is soft.     Tenderness: There is no abdominal tenderness. There is no guarding or rebound.  Musculoskeletal: Normal range of motion.     Comments: Low back without swelling or discoloration.   Skin:    General: Skin is warm and dry.     Findings: No rash.  Neurological:     General: No focal deficit present.     Mental Status: She is alert and oriented to person, place, and time.      ED Treatments / Results  Labs (all labs ordered are listed, but only abnormal results are displayed) Labs Reviewed - No data to display  EKG None  Radiology Dg Knee Complete 4 Views Left  Result Date: 06/21/2019 CLINICAL DATA:  Fall, left knee pain EXAM: LEFT KNEE - COMPLETE 4+ VIEW COMPARISON:  None. FINDINGS: No evidence of fracture, dislocation, or joint effusion. No evidence of arthropathy or other focal bone abnormality. Soft tissues are unremarkable. IMPRESSION: Negative. Electronically Signed   By: Charlett NoseKevin  Dover M.D.   On: 06/21/2019 00:38    Procedures Procedures (including critical care time)  Medications Ordered in ED Medications  diphenhydrAMINE (BENADRYL) injection 25 mg (has no  administration in time range)     Initial Impression / Assessment and Plan / ED Course  I have reviewed the triage vital signs and the nursing notes.  Pertinent labs & imaging results that were available during my care of the patient  were reviewed by me and considered in my medical decision making (see chart for details).        Patient to ED with jaw pain, facial swelling. Seen earlier for nausea vomiting which have resolved. Concerned for reaction to medications given previously.   She is well appearing, though looks uncomfortable. She is using a towel to catch saliva because closing her mouth is uncomfortable. No intraoral swelling or evidence of anaphylaxis. Consider Haldol as offending agent, however, she was given a single 2 mg dose many hours prior to onset of symptoms which makes this less likely.   Benadryl IV given, followed by Ativan. Symptoms have completely resolved. VSS.   No definite cause of jaw pain and stiffness identified. Will continue Benadryl at home and encourage PCP follow up for recheck.   Final Clinical Impressions(s) / ED Diagnoses   Final diagnoses:  None   1. Adverse medication reaction  ED Discharge Orders    None       Elpidio AnisUpstill, Edell Mesenbrink, PA-C 06/22/19 0133    Little, Ambrose Finlandachel Morgan, MD 06/23/19 1555

## 2019-06-21 NOTE — ED Notes (Signed)
Patient has vomited twice and requesting pain medication. Patient also wants something to drink or ice chips. MD made aware.

## 2019-06-21 NOTE — ED Notes (Signed)
Pt said EMS did not give her a mask. Pt was placed in room #11 without a facemask. Said she has been in triage since 6:10 PM.

## 2019-06-21 NOTE — ED Notes (Signed)
Pt yelling out because "swelling is getting worse" and pt states "I can't open my jaw because its hurting". Upon examination pt was able to open mouth and stick out tongue which does not appear swollen.

## 2019-06-21 NOTE — Discharge Instructions (Signed)
Nausea medication has been sent to your pharmacy.  Recommend lots of fluids, avoid alcohol for the next several days until your stomach fully calms down.  Gentle diet, progress back to normal as tolerated. Follow-up with your primary care doctor. Return to the ED for new or worsening symptoms.

## 2019-06-22 NOTE — ED Notes (Signed)
Waiting on discharge paperwork.

## 2019-06-22 NOTE — Discharge Instructions (Addendum)
Continue Benadryl every 6 hours (25 mg) for the next 2-3 days. Follow up with your doctor for recheck this week. REturn to the ED as needed.

## 2019-06-22 NOTE — ED Notes (Signed)
Just received discharge paperwork and gave it to patient.

## 2019-07-06 ENCOUNTER — Other Ambulatory Visit (HOSPITAL_COMMUNITY)
Admission: RE | Admit: 2019-07-06 | Discharge: 2019-07-06 | Disposition: A | Discharge status: Home / Self Care | Payer: Medicaid Other | Source: Ambulatory Visit | Attending: Obstetrics & Gynecology | Admitting: Obstetrics & Gynecology

## 2019-07-06 ENCOUNTER — Other Ambulatory Visit: Payer: Self-pay

## 2019-07-06 ENCOUNTER — Ambulatory Visit: Payer: Medicaid Other | Admitting: Obstetrics & Gynecology

## 2019-07-06 ENCOUNTER — Encounter: Payer: Self-pay | Admitting: Obstetrics & Gynecology

## 2019-07-06 VITALS — BP 93/62 | HR 94 | Ht 63.0 in | Wt 121.0 lb

## 2019-07-06 DIAGNOSIS — Z3202 Encounter for pregnancy test, result negative: Secondary | ICD-10-CM | POA: Diagnosis not present

## 2019-07-06 DIAGNOSIS — R109 Unspecified abdominal pain: Secondary | ICD-10-CM | POA: Diagnosis present

## 2019-07-06 DIAGNOSIS — Z124 Encounter for screening for malignant neoplasm of cervix: Secondary | ICD-10-CM | POA: Diagnosis not present

## 2019-07-06 LAB — POCT URINE PREGNANCY: Preg Test, Ur: NEGATIVE

## 2019-07-06 NOTE — Progress Notes (Signed)
Patient ID: [name] Weber, female   DOB: October 28, 1996, 23 y.o.   MRN: [id number]  Chief Complaint  Patient presents with  . Abdominal Pain    HPI [name] Weber is a 23 y.o. female.  [id number] Patient's last menstrual period was 06/20/2019 (exact date). Regular menses not using contraception currently. She has chronic N&V and abdominal pain and notes dyspareunia. Occasional spotting per vagina as well. She is self referred. HPI  Past Medical History:  Diagnosis Date  . Chlamydia infection   . Trichomonas 01/2012  . UTI (lower urinary tract infection)     Past Surgical History:  Procedure Laterality Date  . NO PAST SURGERIES      Family History  Problem Relation Age of Onset  . Diabetes Maternal Aunt   . Alcohol abuse Neg Hx   . Arthritis Neg Hx   . Asthma Neg Hx   . Birth defects Neg Hx   . Cancer Neg Hx   . COPD Neg Hx   . Depression Neg Hx   . Drug abuse Neg Hx   . Early death Neg Hx   . Hearing loss Neg Hx   . Heart disease Neg Hx   . Hyperlipidemia Neg Hx   . Hypertension Neg Hx   . Kidney disease Neg Hx   . Learning disabilities Neg Hx   . Mental illness Neg Hx   . Mental retardation Neg Hx   . Miscarriages / Stillbirths Neg Hx   . Stroke Neg Hx   . Vision loss Neg Hx     Social History Social History   Tobacco Use  . Smoking status: Current Every Day Smoker    Types: Cigarettes  . Smokeless tobacco: Never Used  Substance Use Topics  . Alcohol use: Yes  . Drug use: No    No Known Allergies  Current Outpatient Medications  Medication Sig Dispense Refill  . acetaminophen (TYLENOL) 500 MG tablet Take 500 mg by mouth every 6 (six) hours as needed for moderate pain.    Marland Kitchen ondansetron (ZOFRAN ODT) 4 MG disintegrating tablet Take 1 tablet (4 mg total) by mouth every 8 (eight) hours as needed for nausea. 10 tablet 0  . pantoprazole (PROTONIX) 20 MG tablet Take 1 tablet (20 mg total) by mouth daily. (Patient not taking: Reported on 06/20/2019) 30 tablet 0    No current facility-administered medications for this visit.     Review of Systems Review of Systems  Constitutional: Negative.   Gastrointestinal: Positive for abdominal distention, abdominal pain, nausea and vomiting. Negative for anal bleeding, constipation and diarrhea.  Genitourinary: Positive for dyspareunia. Negative for menstrual problem, vaginal bleeding and vaginal discharge.    Blood pressure 93/62, pulse 94, height 5\' 3"  (1.6 m), weight 121 lb (54.9 kg), last menstrual period 06/20/2019, unknown if currently breastfeeding.  Physical Exam Physical Exam Vitals signs and nursing note reviewed.  Constitutional:      Appearance: She is well-developed. She is ill-appearing.  HENT:     Head: Normocephalic.  Cardiovascular:     Rate and Rhythm: Normal rate.  Pulmonary:     Effort: Pulmonary effort is normal.  Abdominal:     General: Abdomen is flat.     Palpations: Abdomen is soft.  Genitourinary:    Vagina: Normal. No vaginal discharge or bleeding.     Cervix: Normal.     Uterus: Normal.      Adnexa: Right adnexa normal and left adnexa normal.  Skin:    General:  Skin is warm and dry.  Neurological:     Mental Status: She is alert.  Psychiatric:        Mood and Affect: Mood normal.        Behavior: Behavior normal.    Breasts: breasts appear normal, no suspicious masses, no skin or nipple changes or axillary nodes.  Data Reviewed CT scan 04/2019 normal  Assessment Functional bowel disease is likely Benign pelvic exam, cultures and pap done today  Plan She will establish with PCP F/U on today's labs Yearly exam Take prenatal vitamin as she wants to conceive  Scheryl DarterJames Arnold 07/06/2019, 1:57 PM

## 2019-07-06 NOTE — Progress Notes (Signed)
NGYN patient presents for care today with complaints of abdominal pain N & V and pain w/ intercourse abdominal pain severe in the am.   Pt notes constipation  Last BM was last night  Pt takes tylenol and take pepto for relief.  Pt states she drinks plenty of water.   Contraception : None pt had Nexplanon did not like irregular periods device was removed.   UPT : NEGATIVE

## 2019-07-06 NOTE — Patient Instructions (Signed)
Abdominal Pain, Adult    Many things can cause belly (abdominal) pain. Most times, belly pain is not dangerous. Many cases of belly pain can be watched and treated at home. Sometimes belly pain is serious, though. Your doctor will try to find the cause of your belly pain.  Follow these instructions at home:  · Take over-the-counter and prescription medicines only as told by your doctor. Do not take medicines that help you poop (laxatives) unless told to by your doctor.  · Drink enough fluid to keep your pee (urine) clear or pale yellow.  · Watch your belly pain for any changes.  · Keep all follow-up visits as told by your doctor. This is important.  Contact a doctor if:  · Your belly pain changes or gets worse.  · You are not hungry, or you lose weight without trying.  · You are having trouble pooping (constipated) or have watery poop (diarrhea) for more than 2-3 days.  · You have pain when you pee or poop.  · Your belly pain wakes you up at night.  · Your pain gets worse with meals, after eating, or with certain foods.  · You are throwing up and cannot keep anything down.  · You have a fever.  Get help right away if:  · Your pain does not go away as soon as your doctor says it should.  · You cannot stop throwing up.  · Your pain is only in areas of your belly, such as the right side or the left lower part of the belly.  · You have bloody or black poop, or poop that looks like tar.  · You have very bad pain, cramping, or bloating in your belly.  · You have signs of not having enough fluid or water in your body (dehydration), such as:  ? Dark pee, very little pee, or no pee.  ? Cracked lips.  ? Dry mouth.  ? Sunken eyes.  ? Sleepiness.  ? Weakness.  This information is not intended to replace advice given to you by your health care provider. Make sure you discuss any questions you have with your health care provider.  Document Released: 05/26/2008 Document Revised: 06/27/2016 Document Reviewed: 05/21/2016  Elsevier  Interactive Patient Education © 2020 [employer]

## 2019-07-07 LAB — CERVICOVAGINAL ANCILLARY ONLY
Bacterial vaginitis: POSITIVE — AB
Candida vaginitis: POSITIVE — AB
Chlamydia: NEGATIVE
Neisseria Gonorrhea: NEGATIVE
Trichomonas: POSITIVE — AB

## 2019-07-07 LAB — CYTOLOGY - PAP: Diagnosis: NEGATIVE

## 2019-07-08 ENCOUNTER — Other Ambulatory Visit: Payer: Self-pay | Admitting: Obstetrics & Gynecology

## 2019-07-08 DIAGNOSIS — B373 Candidiasis of vulva and vagina: Secondary | ICD-10-CM

## 2019-07-08 DIAGNOSIS — A599 Trichomoniasis, unspecified: Secondary | ICD-10-CM

## 2019-07-08 DIAGNOSIS — B3731 Acute candidiasis of vulva and vagina: Secondary | ICD-10-CM

## 2019-07-08 MED ORDER — FLUCONAZOLE 150 MG PO TABS: 150.0000 mg | ORAL_TABLET | Freq: Once | ORAL | 0 refills | Status: AC

## 2019-07-08 MED ORDER — METRONIDAZOLE 500 MG PO TABS: 500.0000 mg | ORAL_TABLET | Freq: Two times a day (BID) | ORAL | 0 refills | Status: DC

## 2019-07-13 ENCOUNTER — Telehealth: Payer: Self-pay

## 2019-07-13 NOTE — Telephone Encounter (Signed)
Advised of results, rx sent and treatment plan.

## 2019-08-08 ENCOUNTER — Other Ambulatory Visit (HOSPITAL_COMMUNITY)
Admission: RE | Admit: 2019-08-08 | Discharge: 2019-08-08 | Disposition: A | Discharge status: Home / Self Care | Payer: Medicaid Other | Source: Ambulatory Visit | Attending: Nurse Practitioner | Admitting: Nurse Practitioner

## 2019-08-08 ENCOUNTER — Other Ambulatory Visit: Payer: Self-pay

## 2019-08-08 DIAGNOSIS — Z113 Encounter for screening for infections with a predominantly sexual mode of transmission: Secondary | ICD-10-CM | POA: Insufficient documentation

## 2019-08-08 DIAGNOSIS — N898 Other specified noninflammatory disorders of vagina: Secondary | ICD-10-CM | POA: Diagnosis not present

## 2019-08-11 LAB — URINE CYTOLOGY ANCILLARY ONLY
Candida vaginitis: NEGATIVE
Chlamydia: NEGATIVE
Neisseria Gonorrhea: NEGATIVE
Trichomonas: NEGATIVE

## 2019-08-30 ENCOUNTER — Other Ambulatory Visit (HOSPITAL_COMMUNITY)
Admission: RE | Admit: 2019-08-30 | Discharge: 2019-08-30 | Disposition: A | Discharge status: Home / Self Care | Payer: Medicaid Other | Source: Ambulatory Visit | Attending: Nurse Practitioner | Admitting: Nurse Practitioner

## 2019-08-30 ENCOUNTER — Other Ambulatory Visit: Payer: Self-pay

## 2019-08-30 DIAGNOSIS — N898 Other specified noninflammatory disorders of vagina: Secondary | ICD-10-CM | POA: Diagnosis present

## 2019-09-02 LAB — CERVICOVAGINAL ANCILLARY ONLY
Bacterial vaginitis: POSITIVE — AB
Candida vaginitis: POSITIVE — AB
Chlamydia: NEGATIVE
Neisseria Gonorrhea: NEGATIVE
Trichomonas: NEGATIVE

## 2019-09-23 ENCOUNTER — Other Ambulatory Visit: Payer: Self-pay

## 2019-09-23 ENCOUNTER — Ambulatory Visit: Payer: Self-pay | Admitting: *Deleted

## 2019-09-23 ENCOUNTER — Encounter (HOSPITAL_COMMUNITY): Payer: Self-pay

## 2019-09-23 ENCOUNTER — Ambulatory Visit (HOSPITAL_COMMUNITY)
Admission: EM | Admit: 2019-09-23 | Discharge: 2019-09-23 | Disposition: A | Discharge status: Home / Self Care | Payer: Medicaid Other | Attending: Physician Assistant | Admitting: Physician Assistant

## 2019-09-23 DIAGNOSIS — K529 Noninfective gastroenteritis and colitis, unspecified: Secondary | ICD-10-CM | POA: Diagnosis not present

## 2019-09-23 DIAGNOSIS — Z3202 Encounter for pregnancy test, result negative: Secondary | ICD-10-CM | POA: Diagnosis not present

## 2019-09-23 LAB — POCT PREGNANCY, URINE: Preg Test, Ur: NEGATIVE

## 2019-09-23 MED ORDER — ACETAMINOPHEN 325 MG PO TABS
ORAL_TABLET | ORAL | Status: AC
  Filled 2019-09-23: qty 2

## 2019-09-23 MED ORDER — ACETAMINOPHEN 325 MG PO TABS
650.0000 mg | ORAL_TABLET | Freq: Once | ORAL | Status: AC
  Administered 2019-09-23: 21:00:00 650 mg via ORAL

## 2019-09-23 MED ORDER — ONDANSETRON HCL 4 MG/2ML IJ SOLN
4.0000 mg | Freq: Once | INTRAMUSCULAR | Status: AC
  Administered 2019-09-23: 20:00:00 4 mg via INTRAMUSCULAR

## 2019-09-23 MED ORDER — ONDANSETRON HCL 4 MG/2ML IJ SOLN
INTRAMUSCULAR | Status: AC
  Filled 2019-09-23: qty 2

## 2019-09-23 MED ORDER — ONDANSETRON 4 MG PO TBDP: 4.0000 mg | ORAL_TABLET | Freq: Once | ORAL | Status: DC

## 2019-09-23 NOTE — ED Triage Notes (Addendum)
Patient presents to Urgent Care with complaints of emesis since this morning. Patient reports she thinks she ate some bad seafood last night and that is what's causing her vomiting. Pt vomiting during triage, no diarrhea.  Pt not able to tolerate PO food or liquids.Pt has tried taking odt zofran and vomited it back up shortly afterwards.

## 2019-09-23 NOTE — ED Provider Notes (Signed)
[facility]    CSN: [id number] Arrival date & time: 09/23/19  1828      History   Chief Complaint Chief Complaint  Patient presents with  . Emesis    HPI [name] Weber is a 23 y.o. female.   With emesis since this am. No known exposures are noted. No bad food that she is aware. Soreness as "I need to throw up" in the upper abdomen. Feels "constipated and gassy". No fever or chills are noted. No urinary symtpoms are noted.      Past Medical History:  Diagnosis Date  . Chlamydia infection   . Trichomonas 01/2012  . UTI (lower urinary tract infection)     Patient Active Problem List   Diagnosis Date Noted  . Cannabinoid hyperemesis syndrome 11/22/2018  . Alcohol abuse 11/22/2018  . Intractable nausea and vomiting 11/21/2018  . Hypokalemia 11/21/2018  . Sickle cell trait (HCC) 07/03/2014    Past Surgical History:  Procedure Laterality Date  . NO PAST SURGERIES      OB History    Gravida  4   Para  3   Term  3   Preterm      AB  1   Living  3     SAB      TAB  0   Ectopic      Multiple      Live Births  3            Home Medications    Prior to Admission medications   Medication Sig Start Date End Date Taking? Authorizing Provider  ondansetron (ZOFRAN ODT) 4 MG disintegrating tablet Take 1 tablet (4 mg total) by mouth every 8 (eight) hours as needed for nausea. 06/21/19  Yes Garlon Hatchet, PA-C  pantoprazole (PROTONIX) 20 MG tablet Take 1 tablet (20 mg total) by mouth daily. 04/23/19  Yes Lorre Nick, MD  acetaminophen (TYLENOL) 500 MG tablet Take 500 mg by mouth every 6 (six) hours as needed for moderate pain.    [provider]  metroNIDAZOLE (FLAGYL) 500 MG tablet Take 1 tablet (500 mg total) by mouth 2 (two) times daily. 07/08/19   Adam Phenix, MD    Family History Family History  Problem Relation Age of Onset  . Diabetes Maternal Aunt   . Healthy Mother   . Healthy Father   . Alcohol abuse Neg Hx    . Arthritis Neg Hx   . Asthma Neg Hx   . Birth defects Neg Hx   . Cancer Neg Hx   . COPD Neg Hx   . Depression Neg Hx   . Drug abuse Neg Hx   . Early death Neg Hx   . Hearing loss Neg Hx   . Heart disease Neg Hx   . Hyperlipidemia Neg Hx   . Hypertension Neg Hx   . Kidney disease Neg Hx   . Learning disabilities Neg Hx   . Mental illness Neg Hx   . Mental retardation Neg Hx   . Miscarriages / Stillbirths Neg Hx   . Stroke Neg Hx   . Vision loss Neg Hx     Social History Social History   Tobacco Use  . Smoking status: Current Every Day Smoker    Types: Cigarettes  . Smokeless tobacco: Never Used  Substance Use Topics  . Alcohol use: Yes  . Drug use: Yes    Types: Marijuana    Comment: occasionally  Allergies   Patient has no known allergies.   Review of Systems Review of Systems  All other systems reviewed and are negative.    Physical Exam Triage Vital Signs ED Triage Vitals  Enc Vitals Group     BP 09/23/19 1855 (!) 123/91     Pulse Rate 09/23/19 1855 83     Resp 09/23/19 1855 17     Temp 09/23/19 1855 97.8 F (36.6 C)     Temp Source 09/23/19 1855 Oral     SpO2 09/23/19 1855 98 %     Weight --      Height --      Head Circumference --      Peak Flow --      Pain Score 09/23/19 1852 9     Pain Loc --      Pain Edu? --      Excl. in [place]? --    No data found.  Updated Vital Signs BP (!) 123/91 (BP Location: Left Arm)   Pulse 83   Temp 97.8 F (36.6 C) (Oral)   Resp 17   SpO2 98%   Visual Acuity Right Eye Distance:   Left Eye Distance:   Bilateral Distance:    Right Eye Near:   Left Eye Near:    Bilateral Near:     Physical Exam Vitals signs and nursing note reviewed.  Constitutional:      Appearance: Normal appearance.     Comments: Actively having emesis during exam and history  HENT:     Head: Normocephalic and atraumatic.  Abdominal:     General: Abdomen is flat. There is no distension.     Tenderness: There is no  abdominal tenderness. There is no guarding.  Neurological:     General: No focal deficit present.     Mental Status: She is alert.  Psychiatric:        Mood and Affect: Mood normal.        Behavior: Behavior normal.      UC Treatments / Results  Labs (all labs ordered are listed, but only abnormal results are displayed) Labs Reviewed - No data to display  EKG   Radiology No results found.  Procedures Procedures (including critical care time)  Medications Ordered in UC Medications - No data to display  Initial Impression / Assessment and Plan / UC Course  I have reviewed the triage vital signs and the nursing notes.  Pertinent labs & imaging results that were available during my care of the patient were reviewed by me and considered in my medical decision making (see chart for details).     Treat nausea/emesis, dietary measures. If worsens f/u.  Final Clinical Impressions(s) / UC Diagnoses   Final diagnoses:  None   Discharge Instructions   None    ED Prescriptions    None     PDMP not reviewed this encounter.   Bjorn Pippin, [place] 09/23/19 2029

## 2019-09-23 NOTE — Telephone Encounter (Signed)
   Reason for Disposition . [1] SEVERE vomiting (e.g., 6 or more times/day) AND [2] present > 8 hours  Answer Assessment - Initial Assessment Questions 1. VOMITING SEVERITY: "How many times have you vomited in the past 24 hours?"     - MILD:  1 - 2 times/day    - MODERATE: 3 - 5 times/day, decreased oral intake without significant weight loss or symptoms of dehydration    - SEVERE: 6 or more times/day, vomits everything or nearly everything, with significant weight loss, symptoms of dehydration      More than 10 2. ONSET: "When did the vomiting begin?"      Started this morning  3. FLUIDS: "What fluids or food have you vomited up today?" "Have you been able to keep any fluids down?"    Unable to keep fluids down 4. ABDOMINAL PAIN: "Are your having any abdominal pain?" If yes : "How bad is it and what does it feel like?" (e.g., crampy, dull, intermittent, constant)      Constant abdominal pain, and cramping 5. DIARRHEA: "Is there any diarrhea?" If so, ask: "How many times today?"      No diarrhea 6. CONTACTS: "Is there anyone else in the family with the same symptoms?"      No one else  7. CAUSE: "What do you think is causing your vomiting?"     Unsure  8. HYDRATION STATUS: "Any signs of dehydration?" (e.g., dry mouth [not only dry lips], too weak to stand) "When did you last urinate?"    11 am last time for urination 9. OTHER SYMPTOMS: "Do you have any other symptoms?" (e.g., fever, headache, vertigo, vomiting blood or coffee grounds, recent head injury)    Chills  10. PREGNANCY: "Is there any chance you are pregnant?" "When was your last menstrual period?"      09/14/2019 LMP  Protocols used: [facility]  Per DPR spoke with patient's husband.  He states patient has vomited more than 10 times since this morning.  She is unable to hold fluids down, and have constant abdominal pain.  She reports chills, but has not checked her temperature.  Advised patient's husband he should take her  to the ED for evaluation.  Patient's husband agreeable and states he will take her to ED now.

## 2019-09-23 NOTE — Discharge Instructions (Addendum)
Use your Zofran for emesis. May take Tylenol as directed. Start Pepcid OTC for additional help with emesis. If you worsen then please follow up in the Ed for more testing. Try to sip on gingerail or coke. Avoid fatty foods for at least 48 hours.

## 2019-09-24 ENCOUNTER — Encounter (HOSPITAL_COMMUNITY): Payer: Self-pay

## 2019-09-24 ENCOUNTER — Emergency Department (HOSPITAL_COMMUNITY)
Admission: EM | Admit: 2019-09-24 | Discharge: 2019-09-24 | Disposition: A | Discharge status: Home / Self Care | Payer: Medicaid Other | Attending: Emergency Medicine | Admitting: Emergency Medicine

## 2019-09-24 DIAGNOSIS — Z79899 Other long term (current) drug therapy: Secondary | ICD-10-CM | POA: Diagnosis not present

## 2019-09-24 DIAGNOSIS — R1013 Epigastric pain: Secondary | ICD-10-CM | POA: Diagnosis not present

## 2019-09-24 DIAGNOSIS — R112 Nausea with vomiting, unspecified: Secondary | ICD-10-CM | POA: Diagnosis present

## 2019-09-24 DIAGNOSIS — F17210 Nicotine dependence, cigarettes, uncomplicated: Secondary | ICD-10-CM | POA: Insufficient documentation

## 2019-09-24 DIAGNOSIS — Z3202 Encounter for pregnancy test, result negative: Secondary | ICD-10-CM | POA: Insufficient documentation

## 2019-09-24 DIAGNOSIS — D72829 Elevated white blood cell count, unspecified: Secondary | ICD-10-CM | POA: Diagnosis not present

## 2019-09-24 LAB — COMPREHENSIVE METABOLIC PANEL
ALT: 32 U/L (ref 0–44)
AST: 22 U/L (ref 15–41)
Albumin: 4.2 g/dL (ref 3.5–5.0)
Alkaline Phosphatase: 38 U/L (ref 38–126)
Anion gap: 10 (ref 5–15)
BUN: 10 mg/dL (ref 6–20)
CO2: 17 mmol/L — ABNORMAL LOW (ref 22–32)
Calcium: 8 mg/dL — ABNORMAL LOW (ref 8.9–10.3)
Chloride: 112 mmol/L — ABNORMAL HIGH (ref 98–111)
Creatinine, Ser: 0.6 mg/dL (ref 0.44–1.00)
GFR calc Af Amer: >60 mL/min (ref >60)
GFR calc non Af Amer: >60 mL/min (ref >60)
Glucose, Bld: 108 mg/dL — ABNORMAL HIGH (ref 70–99)
Potassium: 3.5 mmol/L (ref 3.5–5.1)
Sodium: 139 mmol/L (ref 135–145)
Total Bilirubin: 0.8 mg/dL (ref 0.3–1.2)
Total Protein: 6.7 g/dL (ref 6.5–8.1)

## 2019-09-24 LAB — CBC WITH DIFFERENTIAL/PLATELET
Abs Immature Granulocytes: 0.05 10*3/uL (ref 0.00–0.07)
Basophils Absolute: 0 10*3/uL (ref 0.0–0.1)
Basophils Relative: 0 %
Eosinophils Absolute: 0 10*3/uL (ref 0.0–0.5)
Eosinophils Relative: 0 %
HCT: 38.6 % (ref 36.0–46.0)
Hemoglobin: 13.7 g/dL (ref 12.0–15.0)
Immature Granulocytes: 0 %
Lymphocytes Relative: 9 %
Lymphs Abs: 1.4 10*3/uL (ref 0.7–4.0)
MCH: 27.5 pg (ref 26.0–34.0)
MCHC: 35.5 g/dL (ref 30.0–36.0)
MCV: 77.5 fL — ABNORMAL LOW (ref 80.0–100.0)
Monocytes Absolute: 0.6 10*3/uL (ref 0.1–1.0)
Monocytes Relative: 4 %
Neutro Abs: 13.4 10*3/uL — ABNORMAL HIGH (ref 1.7–7.7)
Neutrophils Relative %: 87 %
Platelets: 238 10*3/uL (ref 150–400)
RBC: 4.98 MIL/uL (ref 3.87–5.11)
RDW: 13.2 % (ref 11.5–15.5)
WBC: 15.5 10*3/uL — ABNORMAL HIGH (ref 4.0–10.5)
nRBC: 0 % (ref 0.0–0.2)

## 2019-09-24 LAB — URINALYSIS, ROUTINE W REFLEX MICROSCOPIC
Bacteria, UA: NONE SEEN
Bilirubin Urine: NEGATIVE
Glucose, UA: NEGATIVE mg/dL
Ketones, ur: 20 mg/dL — AB
Leukocytes,Ua: NEGATIVE
Nitrite: NEGATIVE
Protein, ur: 30 mg/dL — AB
Specific Gravity, Urine: 1.018 (ref 1.005–1.030)
pH: 6 (ref 5.0–8.0)

## 2019-09-24 LAB — LIPASE, BLOOD: Lipase: 22 U/L (ref 11–51)

## 2019-09-24 LAB — I-STAT BETA HCG BLOOD, ED (MC, WL, AP ONLY): I-stat hCG, quantitative: <5 m[IU]/mL (ref <5)

## 2019-09-24 MED ORDER — SODIUM CHLORIDE 0.9 % IV BOLUS
1000.0000 mL | Freq: Once | INTRAVENOUS | Status: AC
  Administered 2019-09-24: 06:00:00 1000 mL via INTRAVENOUS

## 2019-09-24 MED ORDER — PROMETHAZINE HCL 25 MG/ML IJ SOLN
25.0000 mg | Freq: Once | INTRAMUSCULAR | Status: AC
  Administered 2019-09-24: 08:00:00 25 mg via INTRAVENOUS
  Filled 2019-09-24: qty 1

## 2019-09-24 MED ORDER — HALOPERIDOL LACTATE 5 MG/ML IJ SOLN
2.0000 mg | Freq: Once | INTRAMUSCULAR | Status: AC
  Administered 2019-09-24: 2 mg via INTRAVENOUS
  Filled 2019-09-24: qty 1

## 2019-09-24 MED ORDER — SODIUM CHLORIDE 0.9 % IV BOLUS
1000.0000 mL | Freq: Once | INTRAVENOUS | Status: AC
  Administered 2019-09-24: 1000 mL via INTRAVENOUS

## 2019-09-24 NOTE — ED Triage Notes (Signed)
Pt complains of nausea and vomiting since yesterday morning, EMS reports orthostatic changes 20 lt AC, EMS gave 400cc NS, 4mg  zofran IV 203 cbg

## 2019-09-24 NOTE — ED Provider Notes (Signed)
[facility]-EMERGENCY DEPT Provider Note   CSN: [id number] Arrival date & time: 09/24/19  9528     History   Chief Complaint No chief complaint on file.   HPI [name] Weber is a 23 y.o. female with a history of sickle cell trait, chronic nausea vomiting, alcohol abuse, and cannabinoid hyperemesis syndrome who presents to the emergency department with a chief complaint of nausea and vomiting.  The patient endorses countless episodes of nonbloody, nonbilious vomiting over the last 24 hours accompanied by generalized abdominal pain.  Although her entire abdomen hurts, she states the worst pain is in the epigastric region.  She describes it as aching and constant.  No known aggravating or alleviating factors.  She was seen by urgent care yesterday for the same.  Blood work was not initiated and the patient was discharged with Zofran.  She denies fever, chills, chest pain, cough, shortness of breath, dysuria, hematuria, vaginal pain, bleeding, discharge, diarrhea, constipation, hematemesis, melena, or hematochezia.  She has been taking the Zofran that she was given Urgent Care at home without improvement.  No history of abdominal surgery.  EMS reports the patient had positive orthostatic vital signs in route.  She was given 400 cc of IV fluids in route.  She smokes approximately 0.5 PPD cigarettes per day.  She reports that she smokes marijuana approximately 2 times per week.  She denies alcohol use.  No other IV or recreational drug use.     The history is provided by the patient. No language interpreter was used.    Past Medical History:  Diagnosis Date  . Chlamydia infection   . Trichomonas 01/2012  . UTI (lower urinary tract infection)     Patient Active Problem List   Diagnosis Date Noted  . Cannabinoid hyperemesis syndrome 11/22/2018  . Alcohol abuse 11/22/2018  . Intractable nausea and vomiting 11/21/2018  . Hypokalemia 11/21/2018  . Sickle cell  trait (HCC) 07/03/2014    Past Surgical History:  Procedure Laterality Date  . NO PAST SURGERIES       OB History    Gravida  4   Para  3   Term  3   Preterm      AB  1   Living  3     SAB      TAB  0   Ectopic      Multiple      Live Births  3            Home Medications    Prior to Admission medications   Medication Sig Start Date End Date Taking? Authorizing Provider  acetaminophen (TYLENOL) 500 MG tablet Take 1,000 mg by mouth every 6 (six) hours as needed for moderate pain.    Yes [provider]  famotidine (PEPCID) 10 MG tablet Take 10 mg by mouth daily as needed for heartburn or indigestion.   Yes [provider]  fluconazole (DIFLUCAN) 150 MG tablet Take 150 mg by mouth See admin instructions. Take 150 mg by mouth once today. If symptoms persist on day 4, then take 150 mg by mouth once.   Yes [provider]  FLUoxetine (PROZAC) 20 MG capsule Take 20 mg by mouth daily. 09/13/19  Yes [provider]  metroNIDAZOLE (FLAGYL) 500 MG tablet Take 1 tablet (500 mg total) by mouth 2 (two) times daily. 07/08/19  Yes Adam Phenix, MD  ondansetron (ZOFRAN ODT) 4 MG disintegrating tablet Take 1 tablet (4 mg total)  by mouth every 8 (eight) hours as needed for nausea. 06/21/19  Yes Garlon HatchetSanders, Lisa M, PA-C  Oxcarbazepine (TRILEPTAL) 300 MG tablet Take 300 mg by mouth 2 (two) times daily. 09/13/19  Yes [provider]  pantoprazole (PROTONIX) 40 MG tablet Take 40 mg by mouth daily. 08/08/19  Yes [provider]  QUEtiapine (SEROQUEL) 50 MG tablet Take 50 mg by mouth at bedtime. 09/13/19  Yes [provider]  pantoprazole (PROTONIX) 20 MG tablet Take 1 tablet (20 mg total) by mouth daily. Patient not taking: Reported on 09/24/2019 04/23/19   Lorre NickAllen, Anthony, MD    Family History Family History  Problem Relation Age of Onset  . Diabetes Maternal Aunt   . Healthy Mother   . Healthy Father   . Alcohol abuse Neg Hx    . Arthritis Neg Hx   . Asthma Neg Hx   . Birth defects Neg Hx   . Cancer Neg Hx   . COPD Neg Hx   . Depression Neg Hx   . Drug abuse Neg Hx   . Early death Neg Hx   . Hearing loss Neg Hx   . Heart disease Neg Hx   . Hyperlipidemia Neg Hx   . Hypertension Neg Hx   . Kidney disease Neg Hx   . Learning disabilities Neg Hx   . Mental illness Neg Hx   . Mental retardation Neg Hx   . Miscarriages / Stillbirths Neg Hx   . Stroke Neg Hx   . Vision loss Neg Hx     Social History Social History   Tobacco Use  . Smoking status: Current Every Day Smoker    Types: Cigarettes  . Smokeless tobacco: Never Used  Substance Use Topics  . Alcohol use: Yes  . Drug use: Yes    Types: Marijuana    Comment: occasionally     Allergies   Patient has no known allergies.   Review of Systems Review of Systems  Constitutional: Negative for activity change, chills and fever.  Respiratory: Negative for shortness of breath and wheezing.   Cardiovascular: Negative for chest pain, palpitations and leg swelling.  Gastrointestinal: Positive for abdominal pain, nausea and vomiting. Negative for anal bleeding, blood in stool, constipation, diarrhea and rectal pain.  Genitourinary: Negative for dysuria, flank pain, frequency, hematuria, pelvic pain, urgency, vaginal bleeding, vaginal discharge and vaginal pain.  Musculoskeletal: Negative for back pain.  Skin: Negative for rash.  Allergic/Immunologic: Negative for immunocompromised state.  Neurological: Negative for dizziness, seizures, syncope, weakness, numbness and headaches.  Psychiatric/Behavioral: Negative for confusion.   Physical Exam Updated Vital Signs BP 132/85   Pulse 82   Temp 98.7 F (37.1 C) (Oral)   Resp 19   Ht 5\' 3"  (1.6 m)   Wt 55.8 kg   LMP 09/14/2019   SpO2 100%   BMI 21.79 kg/m   Physical Exam Vitals signs and nursing note reviewed.  Constitutional:      General: She is not in acute distress.    Appearance: She  is not ill-appearing, toxic-appearing or diaphoretic.  HENT:     Head: Normocephalic.  Eyes:     Conjunctiva/sclera: Conjunctivae normal.  Neck:     Musculoskeletal: Neck supple.  Cardiovascular:     Rate and Rhythm: Normal rate and regular rhythm.     Pulses: Normal pulses.     Heart sounds: Normal heart sounds. No murmur. No friction rub. No gallop.   Pulmonary:     Effort: Pulmonary effort  is normal. No respiratory distress.     Breath sounds: No stridor. No wheezing, rhonchi or rales.  Chest:     Chest wall: No tenderness.  Abdominal:     General: There is no distension.     Palpations: Abdomen is soft. There is no mass.     Tenderness: There is abdominal tenderness. There is no right CVA tenderness, left CVA tenderness, guarding or rebound.     Hernia: No hernia is present.     Comments: Abdomen is soft and nondistended.  Although she is diffusely tender to palpation throughout the abdomen, maximal tenderness is in the epigastric region.  No rebound or guarding.  Negative Murphy sign.  No tenderness over McBurney's point.  No CVA tenderness bilaterally.  Normoactive bowel sounds in all 4 quadrants.  Skin:    General: Skin is warm.     Findings: No rash.  Neurological:     Mental Status: She is alert.  Psychiatric:        Behavior: Behavior normal.    ED Treatments / Results  Labs (all labs ordered are listed, but only abnormal results are displayed) Labs Reviewed  CBC WITH DIFFERENTIAL/PLATELET - Abnormal; Notable for the following components:      Result Value   WBC 15.5 (*)    MCV 77.5 (*)    Neutro Abs 13.4 (*)    All other components within normal limits  URINALYSIS, ROUTINE W REFLEX MICROSCOPIC  COMPREHENSIVE METABOLIC PANEL  LIPASE, BLOOD  POC URINE PREG, ED    EKG None  Radiology No results found.  Procedures Procedures (including critical care time)  Medications Ordered in ED Medications  sodium chloride 0.9 % bolus 1,000 mL (0 mLs Intravenous  Stopped 09/24/19 0730)  haloperidol lactate (HALDOL) injection 2 mg (2 mg Intravenous Given 09/24/19 0745)  promethazine (PHENERGAN) injection 25 mg (25 mg Intravenous Given 09/24/19 0746)     Initial Impression / Assessment and Plan / ED Course  I have reviewed the triage vital signs and the nursing notes.  Pertinent labs & imaging results that were available during my care of the patient were reviewed by me and considered in my medical decision making (see chart for details).        23 year old female with a history of sickle cell trait, chronic nausea vomiting, alcohol abuse, and cannabinoid hyperemesis syndrome who presents to the emergency department with a chief complaint of N/V and abdominal pain for the last 24 hours.  EMS noted positive orthostatics in route and she was given 400 cc of IV fluids and Zofran.  Normotensive in the ER without tachycardia.  She is not hypoxic, tachypneic, or febrile.  She has leukocytosis of 15.5.  She appears to have a chronic leukocytosis, and this is improved from previous.  Pregnancy test is negative.  Labs, including electrolytes, are pending.  Will give Haldol and Phenergan for pain and nausea and plan to reassess.   Patient care transferred to [facility] at the end of my shift.Patient presentation, ED course, and plan of care discussed with review of all pertinent labs and imaging. Please see his/her note for further details regarding further ED course and disposition.  Final Clinical Impressions(s) / ED Diagnoses   Final diagnoses:  None    ED Discharge Orders    None       Windie Marasco A, PA-C 09/24/19 0801    Varney Biles, MD 09/24/19 2258

## 2019-09-24 NOTE — ED Notes (Signed)
Received call that blood specimen had hemolyzed. Attempted to recollect from PIV without success. Pt informed that it is likely that staff will have to use a needle again to obtain bloodwork.

## 2019-09-24 NOTE — ED Notes (Signed)
Pt d/c home per MD order. Discharge summary reviewed , pt verbalizes understanding. Ambulatory off unit with visitor. No s/s of acute distress noted.  

## 2019-09-24 NOTE — ED Provider Notes (Signed)
Care handoff received from [facility], PA-C at shift change.  In short patient with history of sickle cell trait, chronic nausea/vomiting, alcohol abuse, cannabinoid hyperemesis syndrome presents to the emergency room for nausea and vomiting.  Patient noted to have leukocytosis which appears chronic and improved from previous, pregnancy test negative, labs and electrolytes pending.  Patient given Haldol and Phenergan with improvement of symptoms.  Plan of care is to follow-up on remaining labs and reassess. Physical Exam  BP 126/87   Pulse 89   Temp 98.7 F (37.1 C) (Oral)   Resp 17   Ht 5\' 3"  (1.6 m)   Wt 55.8 kg   LMP 09/14/2019   SpO2 100%   BMI 21.79 kg/m   Physical Exam Constitutional:      General: She is not in acute distress.    Appearance: Normal appearance. She is well-developed. She is not ill-appearing or diaphoretic.  HENT:     Head: Normocephalic and atraumatic.     Right Ear: External ear normal.     Left Ear: External ear normal.     Nose: Nose normal.  Eyes:     General: Vision grossly intact. Gaze aligned appropriately.     Pupils: Pupils are equal, round, and reactive to light.  Neck:     Musculoskeletal: Normal range of motion.     Trachea: Trachea and phonation normal. No tracheal deviation.  Pulmonary:     Effort: Pulmonary effort is normal. No respiratory distress.  Abdominal:     General: There is no distension.     Palpations: Abdomen is soft.     Tenderness: There is no abdominal tenderness. There is no guarding or rebound.  Musculoskeletal: Normal range of motion.  Skin:    General: Skin is warm and dry.  Neurological:     Mental Status: She is alert.     GCS: GCS eye subscore is 4. GCS verbal subscore is 5. GCS motor subscore is 6.     Comments: Speech is clear and goal oriented, follows commands Major Cranial nerves without deficit, no facial droop Moves extremities without ataxia, coordination intact  Psychiatric:        Behavior:  Behavior normal.     ED Course/Procedures     Procedures  MDM  Beta-hCG negative Lipase within normal limits CBC with leukocytosis 15.5 with left shift, improved from prior CMP nonacute Urinalysis with moderate hemoglobin, 20 ketones, 30 protein, appears secondary to dehydration, no signs of infection - Patient reassessed resting comfortably no acute distress, no subsequent nausea or vomiting since administration of medications.  Reassessment of the abdomen reveals a soft nontender abdomen without peritoneal signs or distention.  Patient reports she feels dehydrated still and is requesting additional IV fluids.  Additional liter fluid bolus given. - Patient tolerating crackers and water, appears stable for discharge, I have counseled patient on cessation of marijuana use as this may be contributing to her symptoms.  Notification for CT imaging or additional work-up here in the ER as labs appear baseline and she has a non-acute abdomen.  At this time there does not appear to be any evidence of an acute emergency medical condition and the patient appears stable for discharge with appropriate outpatient follow up. Diagnosis was discussed with patient who verbalizes understanding of care plan and is agreeable to discharge. I have discussed return precautions with patient who verbalizes understanding of return precautions. Patient encouraged to follow-up with their PCP. All questions answered.  Patient's case discussed with Dr.  Ralene Bathe who agrees with plan to discharge with follow-up.   Note: Portions of this report may have been transcribed using voice recognition software. Every effort was made to ensure accuracy; however, inadvertent computerized transcription errors may still be present.   Gari Crown 09/24/19 1220    Quintella Reichert, MD 09/25/19 [phone]

## 2019-09-24 NOTE — ED Triage Notes (Signed)
Pt seen at Urgent Care last night and hasn't been able to keep any meds down

## 2019-09-24 NOTE — ED Notes (Signed)
Pt provided saltines and water, tolerated well.

## 2019-09-24 NOTE — Discharge Instructions (Addendum)
You have been diagnosed today with Nausea and Vomiting.  At this time there does not appear to be the presence of an emergent medical condition, however there is always the potential for conditions to change. Please read and follow the below instructions.  Please return to the Emergency Department immediately for any new or worsening symptoms. Please be sure to follow up with your Primary Care Provider within one week regarding your visit today; please call their office to schedule an appointment even if you are feeling better for a follow-up visit. Please be sure to drink plenty of water and get plenty of rest to avoid dehydration. Please attempt to stop using marijuana as this may be contributing to your symptoms.  Get help right away if: You cannot stop vomiting. You have blood in your vomit or your vomit looks like coffee grounds. You have severe abdominal pain. You have stools that are bloody or black, or stools that look like tar. You have symptoms of dehydration, such as: Sunken eyes. Inability to make tears. Cracked lips. Dry mouth. Decreased urine production. Weakness. Sleepiness. Fainting. Your pain does not go away as soon as your doctor says it should. You cannot stop throwing up. Your pain is only in areas of your belly, such as the right side or the left lower part of the belly. You have bloody or black poop, or poop that looks like tar. You have very bad pain, cramping, or bloating in your belly. You have any new/concerning or worsening symptoms.  Please read the additional information packets attached to your discharge summary.  Note: Portions of this text may have been transcribed using voice recognition software. Every effort was made to ensure accuracy; however, inadvertent computerized transcription errors may still be present.

## 2019-09-24 NOTE — ED Notes (Signed)
Pt encouraged to provide urine specimen per MD order.  

## 2019-10-17 ENCOUNTER — Encounter (HOSPITAL_COMMUNITY): Payer: Self-pay | Admitting: Emergency Medicine

## 2019-10-17 ENCOUNTER — Other Ambulatory Visit: Payer: Self-pay

## 2019-10-17 ENCOUNTER — Ambulatory Visit (HOSPITAL_COMMUNITY)
Admission: EM | Admit: 2019-10-17 | Discharge: 2019-10-17 | Disposition: A | Discharge status: Home / Self Care | Payer: Medicaid Other | Attending: Family Medicine | Admitting: Family Medicine

## 2019-10-17 ENCOUNTER — Emergency Department (HOSPITAL_COMMUNITY)
Admission: EM | Admit: 2019-10-17 | Discharge: 2019-10-17 | Disposition: A | Discharge status: Home / Self Care | Payer: Medicaid Other | Attending: Emergency Medicine | Admitting: Emergency Medicine

## 2019-10-17 ENCOUNTER — Encounter (HOSPITAL_COMMUNITY): Payer: Self-pay

## 2019-10-17 DIAGNOSIS — Z5321 Procedure and treatment not carried out due to patient leaving prior to being seen by health care provider: Secondary | ICD-10-CM | POA: Diagnosis not present

## 2019-10-17 DIAGNOSIS — G44319 Acute post-traumatic headache, not intractable: Secondary | ICD-10-CM

## 2019-10-17 DIAGNOSIS — R519 Headache, unspecified: Secondary | ICD-10-CM | POA: Insufficient documentation

## 2019-10-17 NOTE — ED Notes (Signed)
Patient is being discharged from the Urgent Wing and sent to the Emergency Department via personal vehicle by family member. Per provider Augusto Gamble, patient is stable but in need of higher level of care due to needing CT scan. Patient is aware and verbalizes understanding of plan of care.   Vitals:   10/17/19 1600  BP: 114/88  Resp: 16  Temp: 97.7 F (36.5 C)  SpO2: 100%
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
## 2019-10-17 NOTE — Discharge Instructions (Signed)
I do recommend further evaluation in the ER 
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
## 2019-10-17 NOTE — ED Notes (Signed)
Spoke with patient. She stated that she was in pain. I informed her that I understood, and was trying to her seen as soon as possible. She stated that she would go to [facility], or come back tomorrow.

## 2019-10-18 ENCOUNTER — Other Ambulatory Visit: Payer: Self-pay | Admitting: Nurse Practitioner

## 2019-10-25 ENCOUNTER — Other Ambulatory Visit: Payer: Self-pay | Admitting: Nurse Practitioner

## 2019-10-25 ENCOUNTER — Ambulatory Visit
Admission: RE | Admit: 2019-10-25 | Discharge: 2019-10-25 | Disposition: A | Discharge status: Home / Self Care | Payer: Medicaid Other | Source: Ambulatory Visit | Attending: Nurse Practitioner | Admitting: Nurse Practitioner

## 2019-10-25 ENCOUNTER — Other Ambulatory Visit: Payer: Self-pay

## 2019-10-25 DIAGNOSIS — R52 Pain, unspecified: Secondary | ICD-10-CM

## 2019-10-25 LAB — MOLECULAR ANCILLARY ONLY
Bacterial Vaginitis (gardnerella): POSITIVE — AB
Candida Glabrata: NEGATIVE
Candida Vaginitis: NEGATIVE
Chlamydia: NEGATIVE
Comment: NEGATIVE
Comment: NEGATIVE
Comment: NEGATIVE
Comment: NEGATIVE
Comment: NEGATIVE
Comment: NORMAL
Neisseria Gonorrhea: NEGATIVE
Trichomonas: NEGATIVE

## 2019-11-26 ENCOUNTER — Other Ambulatory Visit: Payer: Self-pay

## 2019-11-26 ENCOUNTER — Encounter (HOSPITAL_COMMUNITY): Payer: Self-pay | Admitting: Emergency Medicine

## 2019-11-26 ENCOUNTER — Emergency Department (HOSPITAL_COMMUNITY)
Admission: EM | Admit: 2019-11-26 | Discharge: 2019-11-26 | Disposition: A | Discharge status: Home / Self Care | Payer: Medicaid Other | Source: Home / Self Care | Attending: Emergency Medicine | Admitting: Emergency Medicine

## 2019-11-26 ENCOUNTER — Encounter (HOSPITAL_COMMUNITY): Payer: Self-pay

## 2019-11-26 ENCOUNTER — Emergency Department (HOSPITAL_COMMUNITY)
Admission: EM | Admit: 2019-11-26 | Discharge: 2019-11-26 | Disposition: A | Discharge status: Home / Self Care | Payer: Medicaid Other | Attending: Emergency Medicine | Admitting: Emergency Medicine

## 2019-11-26 DIAGNOSIS — R1110 Vomiting, unspecified: Secondary | ICD-10-CM | POA: Diagnosis present

## 2019-11-26 DIAGNOSIS — Z5321 Procedure and treatment not carried out due to patient leaving prior to being seen by health care provider: Secondary | ICD-10-CM | POA: Diagnosis not present

## 2019-11-26 DIAGNOSIS — R112 Nausea with vomiting, unspecified: Secondary | ICD-10-CM

## 2019-11-26 DIAGNOSIS — F17210 Nicotine dependence, cigarettes, uncomplicated: Secondary | ICD-10-CM | POA: Insufficient documentation

## 2019-11-26 DIAGNOSIS — Z79899 Other long term (current) drug therapy: Secondary | ICD-10-CM | POA: Insufficient documentation

## 2019-11-26 DIAGNOSIS — R1013 Epigastric pain: Secondary | ICD-10-CM | POA: Insufficient documentation

## 2019-11-26 LAB — COMPREHENSIVE METABOLIC PANEL
ALT: 19 U/L (ref 0–44)
ALT: 20 U/L (ref 0–44)
AST: 20 U/L (ref 15–41)
AST: 24 U/L (ref 15–41)
Albumin: 4.8 g/dL (ref 3.5–5.0)
Albumin: 5 g/dL (ref 3.5–5.0)
Alkaline Phosphatase: 38 U/L (ref 38–126)
Alkaline Phosphatase: 41 U/L (ref 38–126)
Anion gap: 12 (ref 5–15)
Anion gap: 11 (ref 5–15)
BUN: 8 mg/dL (ref 6–20)
BUN: 6 mg/dL (ref 6–20)
CO2: 22 mmol/L (ref 22–32)
CO2: 23 mmol/L (ref 22–32)
Calcium: 9.5 mg/dL (ref 8.9–10.3)
Calcium: 9.7 mg/dL (ref 8.9–10.3)
Chloride: 106 mmol/L (ref 98–111)
Chloride: 105 mmol/L (ref 98–111)
Creatinine, Ser: 0.71 mg/dL (ref 0.44–1.00)
Creatinine, Ser: 0.68 mg/dL (ref 0.44–1.00)
GFR calc Af Amer: >60 mL/min (ref >60)
GFR calc Af Amer: >60 mL/min (ref >60)
GFR calc non Af Amer: >60 mL/min (ref >60)
GFR calc non Af Amer: >60 mL/min (ref >60)
Glucose, Bld: 144 mg/dL — ABNORMAL HIGH (ref 70–99)
Glucose, Bld: 122 mg/dL — ABNORMAL HIGH (ref 70–99)
Potassium: 3.3 mmol/L — ABNORMAL LOW (ref 3.5–5.1)
Potassium: 3.4 mmol/L — ABNORMAL LOW (ref 3.5–5.1)
Sodium: 140 mmol/L (ref 135–145)
Sodium: 139 mmol/L (ref 135–145)
Total Bilirubin: 0.7 mg/dL (ref 0.3–1.2)
Total Bilirubin: 0.7 mg/dL (ref 0.3–1.2)
Total Protein: 7.6 g/dL (ref 6.5–8.1)
Total Protein: 7.5 g/dL (ref 6.5–8.1)

## 2019-11-26 LAB — CBC WITH DIFFERENTIAL/PLATELET
Abs Immature Granulocytes: 0.08 10*3/uL — ABNORMAL HIGH (ref 0.00–0.07)
Basophils Absolute: 0.1 10*3/uL (ref 0.0–0.1)
Basophils Relative: 0 %
Eosinophils Absolute: 0 10*3/uL (ref 0.0–0.5)
Eosinophils Relative: 0 %
HCT: 38.6 % (ref 36.0–46.0)
Hemoglobin: 13.7 g/dL (ref 12.0–15.0)
Immature Granulocytes: 1 %
Lymphocytes Relative: 6 %
Lymphs Abs: 1.1 10*3/uL (ref 0.7–4.0)
MCH: 28 pg (ref 26.0–34.0)
MCHC: 35.5 g/dL (ref 30.0–36.0)
MCV: 78.9 fL — ABNORMAL LOW (ref 80.0–100.0)
Monocytes Absolute: 0.8 10*3/uL (ref 0.1–1.0)
Monocytes Relative: 5 %
Neutro Abs: 15.7 10*3/uL — ABNORMAL HIGH (ref 1.7–7.7)
Neutrophils Relative %: 88 %
Platelets: 263 10*3/uL (ref 150–400)
RBC: 4.89 MIL/uL (ref 3.87–5.11)
RDW: 13.4 % (ref 11.5–15.5)
WBC: 17.8 10*3/uL — ABNORMAL HIGH (ref 4.0–10.5)
nRBC: 0 % (ref 0.0–0.2)

## 2019-11-26 LAB — LIPASE, BLOOD
Lipase: 24 U/L (ref 11–51)
Lipase: 22 U/L (ref 11–51)

## 2019-11-26 LAB — URINALYSIS, ROUTINE W REFLEX MICROSCOPIC
Bacteria, UA: NONE SEEN
Bilirubin Urine: NEGATIVE
Glucose, UA: NEGATIVE mg/dL
Hgb urine dipstick: NEGATIVE
Ketones, ur: 80 mg/dL — AB
Leukocytes,Ua: NEGATIVE
Nitrite: NEGATIVE
Protein, ur: 100 mg/dL — AB
Specific Gravity, Urine: 1.019 (ref 1.005–1.030)
pH: 9 — ABNORMAL HIGH (ref 5.0–8.0)

## 2019-11-26 LAB — CBC
HCT: 39.7 % (ref 36.0–46.0)
Hemoglobin: 13.7 g/dL (ref 12.0–15.0)
MCH: 27.5 pg (ref 26.0–34.0)
MCHC: 34.5 g/dL (ref 30.0–36.0)
MCV: 79.7 fL — ABNORMAL LOW (ref 80.0–100.0)
Platelets: 273 10*3/uL (ref 150–400)
RBC: 4.98 MIL/uL (ref 3.87–5.11)
RDW: 13.2 % (ref 11.5–15.5)
WBC: 18.1 10*3/uL — ABNORMAL HIGH (ref 4.0–10.5)
nRBC: 0 % (ref 0.0–0.2)

## 2019-11-26 LAB — I-STAT BETA HCG BLOOD, ED (MC, WL, AP ONLY)
I-stat hCG, quantitative: <5 m[IU]/mL (ref <5)
I-stat hCG, quantitative: <5 m[IU]/mL (ref <5)

## 2019-11-26 MED ORDER — FAMOTIDINE IN NACL 20-0.9 MG/50ML-% IV SOLN
20.0000 mg | Freq: Once | INTRAVENOUS | Status: AC
  Administered 2019-11-26: 20 mg via INTRAVENOUS
  Filled 2019-11-26: qty 50

## 2019-11-26 MED ORDER — ONDANSETRON 4 MG PO TBDP: 4.0000 mg | ORAL_TABLET | Freq: Three times a day (TID) | ORAL | 0 refills | Status: DC | PRN

## 2019-11-26 MED ORDER — PROMETHAZINE HCL 25 MG/ML IJ SOLN
25.0000 mg | Freq: Once | INTRAMUSCULAR | Status: AC
  Administered 2019-11-26: 25 mg via INTRAVENOUS
  Filled 2019-11-26: qty 1

## 2019-11-26 MED ORDER — ONDANSETRON HCL 4 MG/2ML IJ SOLN: 4.0000 mg | Freq: Once | INTRAMUSCULAR | Status: DC

## 2019-11-26 MED ORDER — ONDANSETRON HCL 4 MG/2ML IJ SOLN
4.0000 mg | Freq: Once | INTRAMUSCULAR | Status: AC
  Administered 2019-11-26: 4 mg via INTRAVENOUS
  Filled 2019-11-26: qty 2

## 2019-11-26 MED ORDER — SODIUM CHLORIDE 0.9% FLUSH: 3.0000 mL | Freq: Once | INTRAVENOUS | Status: DC

## 2019-11-26 MED ORDER — SODIUM CHLORIDE 0.9 % IV BOLUS
1000.0000 mL | Freq: Once | INTRAVENOUS | Status: AC
  Administered 2019-11-26: 1000 mL via INTRAVENOUS

## 2019-11-26 NOTE — ED Triage Notes (Signed)
C/o sharp abd pain, nausea, vomiting, dry heaves, and feeling weak that started this morning.  Denies diarrhea.  Denies fever.

## 2019-11-26 NOTE — ED Provider Notes (Signed)
Care assumed at shift change from [place], [place], pending labs and re-evaluation. See her note for full HPI and workup. Briefly, pt presenting with nausea vomiting epigastric abdominal pain after Alcohol and marijuana use last night.  No diarrhea, bloody stools or fever.  Patient with history of cannabinoid hyperemesis per chart review.  She also reports daily marijuana use and history of acid reflux treated with Protonix.  She has had labs ordered, interventions include IVF, pepcid, zofran.  Likely alcohol gastritis vs cannabis hyperemesis Physical Exam  BP (!) 128/100 (BP Location: Left Arm)   Pulse 84   Temp 97.9 F (36.6 C) (Oral)   Resp 16   LMP 11/14/2019   SpO2 99%   Physical Exam Vitals signs and nursing note reviewed.  Constitutional:      General: She is not in acute distress.    Appearance: She is well-developed. She is not ill-appearing.  HENT:     Head: Normocephalic and atraumatic.  Eyes:     Conjunctiva/sclera: Conjunctivae normal.  Pulmonary:     Effort: Pulmonary effort is normal.  Abdominal:     General: Bowel sounds are normal.     Palpations: Abdomen is soft.     Tenderness: There is no abdominal tenderness.  Skin:    General: Skin is warm.  Neurological:     Mental Status: She is alert.  Psychiatric:        Behavior: Behavior normal.     ED Course/Procedures   Results for orders placed or performed during the hospital encounter of 11/26/19  Comprehensive metabolic panel  Result Value Ref Range   Sodium 139 135 - 145 mmol/L   Potassium 3.4 (L) 3.5 - 5.1 mmol/L   Chloride 105 98 - 111 mmol/L   CO2 23 22 - 32 mmol/L   Glucose, Bld 122 (H) 70 - 99 mg/dL   BUN 6 6 - 20 mg/dL   Creatinine, Ser 5.28 0.44 - 1.00 mg/dL   Calcium 9.7 8.9 - 41.3 mg/dL   Total Protein 7.5 6.5 - 8.1 g/dL   Albumin 5.0 3.5 - 5.0 g/dL   AST 24 15 - 41 U/L   ALT 20 0 - 44 U/L   Alkaline Phosphatase 41 38 - 126 U/L   Total Bilirubin 0.7 0.3 - 1.2 mg/dL   GFR calc non Af Amer >60  >60 mL/min   GFR calc Af Amer >60 >60 mL/min   Anion gap 11 5 - 15  Lipase, blood  Result Value Ref Range   Lipase 22 11 - 51 U/L  CBC with Differential  Result Value Ref Range   WBC 17.8 (H) 4.0 - 10.5 K/uL   RBC 4.89 3.87 - 5.11 MIL/uL   Hemoglobin 13.7 12.0 - 15.0 g/dL   HCT 24.4 01.0 - 27.2 %   MCV 78.9 (L) 80.0 - 100.0 fL   MCH 28.0 26.0 - 34.0 pg   MCHC 35.5 30.0 - 36.0 g/dL   RDW 53.6 64.4 - 03.4 %   Platelets 263 150 - 400 K/uL   nRBC 0.0 0.0 - 0.2 %   Neutrophils Relative % 88 %   Neutro Abs 15.7 (H) 1.7 - 7.7 K/uL   Lymphocytes Relative 6 %   Lymphs Abs 1.1 0.7 - 4.0 K/uL   Monocytes Relative 5 %   Monocytes Absolute 0.8 0.1 - 1.0 K/uL   Eosinophils Relative 0 %   Eosinophils Absolute 0.0 0.0 - 0.5 K/uL   Basophils Relative 0 %   Basophils  Absolute 0.1 0.0 - 0.1 K/uL   Immature Granulocytes 1 %   Abs Immature Granulocytes 0.08 (H) 0.00 - 0.07 K/uL  Urinalysis, Routine w reflex microscopic  Result Value Ref Range   Color, Urine YELLOW YELLOW   APPearance CLOUDY (A) CLEAR   Specific Gravity, Urine 1.019 1.005 - 1.030   pH 9.0 (H) 5.0 - 8.0   Glucose, UA NEGATIVE NEGATIVE mg/dL   Hgb urine dipstick NEGATIVE NEGATIVE   Bilirubin Urine NEGATIVE NEGATIVE   Ketones, ur 80 (A) NEGATIVE mg/dL   Protein, ur 100 (A) NEGATIVE mg/dL   Nitrite NEGATIVE NEGATIVE   Leukocytes,Ua NEGATIVE NEGATIVE   RBC / HPF 0-5 0 - 5 RBC/hpf   WBC, UA 0-5 0 - 5 WBC/hpf   Bacteria, UA NONE SEEN NONE SEEN   Squamous Epithelial / LPF 21-50 0 - 5   Mucus PRESENT    Amorphous Crystal PRESENT   I-Stat beta hCG blood, ED  Result Value Ref Range   I-stat hCG, quantitative <5.0 <5 mIU/mL   Comment 3           No results found.   Clinical Course as of Nov 25 2117  Sat Nov 26, 2019  1745 Patient evaluated.  Reports some improvement in symptoms, however nausea returned.  Will dose with Phenergan.  She endorses history of acid reflux which she treats with pantoprazole.  She has had similar  episodes in the past.  She states she smokes marijuana daily, however has not been told that marijuana can cause a cyclical vomiting.  Nominal exam is with very slight epigastric tenderness on exam, no guarding or rebound.  She overall is in no distress.  Will redose with Phenergan with anticipated p.o. challenge and discharge.   [JR]    Clinical Course User Index [JR] Robinson, [place] N, PA-C    Procedures  MDM   Labs with leukocytosis, however otherwise are unremarkable.  History of similar leukocytosis with similar presentation.  She states she feels the same as she did during previous ED visits.  Repeat abdominal exam is benign.  Low suspicion for emergent or surgical abdominal pathology.  Patient tolerating p.o. fluids prior to discharge.  Patient is agreeable to plan and feels comfortable with discharge.      Robinson, [place] N, PA-C 11/26/19 2120    Little, Wenda Overland, MD 11/27/19 1058

## 2019-11-26 NOTE — ED Provider Notes (Deleted)
Patient presenting with multiple episodes of emesis and associated epigastric pain.  She has felt generalized fatigue and weakness since vomiting.  She denies any fever, cough, chest pain, diarrhea, bloody stools.  She has had some decreased urination today.  Patient reports she was celebrating for her birthday last evening and had 4 shots of Hennessy and was smoking marijuana.  Patient reports prior to this, she was feeling well.  MSE was initiated and I personally evaluated the patient and placed orders (if any) at  2:50 PM on November 26, 2019.  The patient appears stable so that the remainder of the MSE may be completed by another provider.   Frederica Kuster, PA-C 11/26/19 1451

## 2019-11-26 NOTE — ED Notes (Signed)
Pt ambulatory and stable upon discharge. Pt verbally understood discharge instructions and follow up self care.

## 2019-11-26 NOTE — ED Triage Notes (Signed)
Pt states she has felt weak this morning. Pt states that she has had emesis today "too many times". Denies diarrhea, cough, SHOB.

## 2019-11-26 NOTE — ED Notes (Signed)
Pt informed of the need for UA. Not able to void at the moment. Will try to provide sample after some IV fluids.

## 2019-11-26 NOTE — Discharge Instructions (Addendum)
It is recommended that you avoid alcohol and marijuana use as both of these things can worsen your symptoms.  Alcohol is very rough on the stomach lining and can cause inflammation in the stomach lining and worsen your acid reflux. Marijuana use can cause a recurrent nausea vomiting with abdominal cramping. You can take Zofran every 8 hours as needed for nausea vomiting. Start with clear liquids and slowly advance your diet back to normal as tolerated.

## 2019-11-26 NOTE — ED Provider Notes (Addendum)
[facility] EMERGENCY DEPARTMENT Provider Note   CSN: [id number] Arrival date & time: 11/26/19  1406     History   Chief Complaint Chief Complaint  Patient presents with  . Emesis  . Abdominal Pain    HPI [name] Weber is a 23 y.o. female presenting with multiple episodes of emesis and associated epigastric pain.  She has felt generalized fatigue and weakness since vomiting.  She denies any fever, cough, chest pain, diarrhea, bloody stools.  She has had some decreased urination today.  Patient reports she was celebrating for her birthday last evening and had 4 shots of Hennessy and was smoking marijuana.  Patient reports prior to this, she was feeling well.     HPI  Past Medical History:  Diagnosis Date  . Chlamydia infection   . Trichomonas 01/2012  . UTI (lower urinary tract infection)     Patient Active Problem List   Diagnosis Date Noted  . Cannabinoid hyperemesis syndrome 11/22/2018  . Alcohol abuse 11/22/2018  . Intractable nausea and vomiting 11/21/2018  . Hypokalemia 11/21/2018  . Sickle cell trait (HCC) 07/03/2014    Past Surgical History:  Procedure Laterality Date  . NO PAST SURGERIES       OB History    Gravida  4   Para  3   Term  3   Preterm      AB  1   Living  3     SAB      TAB  0   Ectopic      Multiple      Live Births  3            Home Medications    Prior to Admission medications   Medication Sig Start Date End Date Taking? Authorizing Provider  acetaminophen (TYLENOL) 500 MG tablet Take 1,000 mg by mouth every 6 (six) hours as needed for moderate pain.    Yes [provider]  famotidine (PEPCID) 10 MG tablet Take 10 mg by mouth daily as needed for heartburn or indigestion.   Yes [provider]  FLUoxetine (PROZAC) 20 MG capsule Take 40 mg by mouth daily.  09/13/19  Yes [provider]  Oxcarbazepine (TRILEPTAL) 300 MG tablet Take 300 mg by mouth 2 (two) times daily.  09/13/19  Yes [provider]  pantoprazole (PROTONIX) 40 MG tablet Take 40 mg by mouth daily. 08/08/19  Yes [provider]  QUEtiapine (SEROQUEL) 50 MG tablet Take 50 mg by mouth at bedtime. 09/13/19  Yes [provider]  metroNIDAZOLE (FLAGYL) 500 MG tablet Take 1 tablet (500 mg total) by mouth 2 (two) times daily. Patient not taking: Reported on 11/26/2019 07/08/19   Adam Phenix, MD  ondansetron (ZOFRAN ODT) 4 MG disintegrating tablet Take 1 tablet (4 mg total) by mouth every 8 (eight) hours as needed for nausea. 11/26/19   Robinson, [place] N, PA-C  pantoprazole (PROTONIX) 20 MG tablet Take 1 tablet (20 mg total) by mouth daily. Patient not taking: Reported on 09/24/2019 04/23/19   Lorre Nick, MD    Family History Family History  Problem Relation Age of Onset  . Diabetes Maternal Aunt   . Healthy Mother   . Healthy Father   . Alcohol abuse Neg Hx   . Arthritis Neg Hx   . Asthma Neg Hx   . Birth defects Neg Hx   . Cancer Neg Hx   . COPD Neg Hx   . Depression Neg Hx   .  Drug abuse Neg Hx   . Early death Neg Hx   . Hearing loss Neg Hx   . Heart disease Neg Hx   . Hyperlipidemia Neg Hx   . Hypertension Neg Hx   . Kidney disease Neg Hx   . Learning disabilities Neg Hx   . Mental illness Neg Hx   . Mental retardation Neg Hx   . Miscarriages / Stillbirths Neg Hx   . Stroke Neg Hx   . Vision loss Neg Hx     Social History Social History   Tobacco Use  . Smoking status: Current Every Day Smoker    Packs/day: 0.50    Types: Cigarettes  . Smokeless tobacco: Never Used  Substance Use Topics  . Alcohol use: Yes  . Drug use: Yes    Types: Marijuana    Comment: occasionally     Allergies   Patient has no known allergies.   Review of Systems Review of Systems  Constitutional: Negative for chills and fever.  HENT: Negative for facial swelling and sore throat.   Respiratory: Negative for shortness of breath.   Cardiovascular: Negative for  chest pain.  Gastrointestinal: Positive for abdominal pain, nausea and vomiting. Negative for diarrhea.  Genitourinary: Positive for decreased urine volume. Negative for dysuria.  Musculoskeletal: Negative for back pain.  Skin: Negative for rash and wound.  Neurological: Negative for headaches.  Psychiatric/Behavioral: The patient is not nervous/anxious.      Physical Exam Updated Vital Signs BP (!) 128/92   Pulse 86   Temp 97.9 F (36.6 C) (Oral)   Resp 16   LMP 11/14/2019   SpO2 99%   Physical Exam Vitals signs and nursing note reviewed.  Constitutional:      General: She is not in acute distress.    Appearance: She is well-developed. She is not diaphoretic.  HENT:     Head: Normocephalic and atraumatic.     Mouth/Throat:     Pharynx: No oropharyngeal exudate.  Eyes:     General: No scleral icterus.       Right eye: No discharge.        Left eye: No discharge.     Conjunctiva/sclera: Conjunctivae normal.     Pupils: Pupils are equal, round, and reactive to light.  Neck:     Musculoskeletal: Normal range of motion and neck supple.     Thyroid: No thyromegaly.  Cardiovascular:     Rate and Rhythm: Normal rate and regular rhythm.     Heart sounds: Normal heart sounds. No murmur. No friction rub. No gallop.   Pulmonary:     Effort: Pulmonary effort is normal. No respiratory distress.     Breath sounds: Normal breath sounds. No stridor. No wheezing or rales.  Abdominal:     General: Bowel sounds are normal. There is no distension.     Palpations: Abdomen is soft.     Tenderness: There is abdominal tenderness in the epigastric area. There is no right CVA tenderness, left CVA tenderness, guarding or rebound.  Lymphadenopathy:     Cervical: No cervical adenopathy.  Skin:    General: Skin is warm and dry.     Coloration: Skin is not pale.     Findings: No rash.  Neurological:     Mental Status: She is alert.     Coordination: Coordination normal.      ED  Treatments / Results  Labs (all labs ordered are listed, but only abnormal results are displayed) Labs Reviewed  COMPREHENSIVE METABOLIC PANEL - Abnormal; Notable for the following components:      Result Value   Potassium 3.4 (*)    Glucose, Bld 122 (*)    All other components within normal limits  CBC WITH DIFFERENTIAL/PLATELET - Abnormal; Notable for the following components:   WBC 17.8 (*)    MCV 78.9 (*)    Neutro Abs 15.7 (*)    Abs Immature Granulocytes 0.08 (*)    All other components within normal limits  URINALYSIS, ROUTINE W REFLEX MICROSCOPIC - Abnormal; Notable for the following components:   APPearance CLOUDY (*)    pH 9.0 (*)    Ketones, ur 80 (*)    Protein, ur 100 (*)    All other components within normal limits  LIPASE, BLOOD  I-STAT BETA HCG BLOOD, ED ([facility], [facility], [facility] ONLY)    EKG None  Radiology No results found.  Procedures Procedures (including critical care time)  Medications Ordered in ED Medications  sodium chloride 0.9 % bolus 1,000 mL (0 mLs Intravenous Stopped 11/26/19 1700)  ondansetron (ZOFRAN) injection 4 mg (4 mg Intravenous Given 11/26/19 1534)  famotidine (PEPCID) IVPB 20 mg premix (0 mg Intravenous Stopped 11/26/19 1606)  promethazine (PHENERGAN) injection 25 mg (25 mg Intravenous Given 11/26/19 1733)     Initial Impression / Assessment and Plan / ED Course  I have reviewed the triage vital signs and the nursing notes.  Pertinent labs & imaging results that were available during my care of the patient were reviewed by me and considered in my medical decision making (see chart for details).  Clinical Course as of Nov 26 741  Sat Nov 26, 2019  1745 Patient evaluated.  Reports some improvement in symptoms, however nausea returned.  Will dose with Phenergan.  She endorses history of acid reflux which she treats with pantoprazole.  She has had similar episodes in the past.  She states she smokes marijuana daily, however has not been told that  marijuana can cause a cyclical vomiting.  Nominal exam is with very slight epigastric tenderness on exam, no guarding or rebound.  She overall is in no distress.  Will redose with Phenergan with anticipated p.o. challenge and discharge.   [JR]    Clinical Course User Index [JR] Robinson, [place] N, PA-C       Patient presenting with epigastric pain and vomiting after drinking alcohol and using marijuana last night and celebration of her birthday.  She has epigastric tenderness only.  Suspect alcohol related gastritis versus cannabis hyperemesis.  Labs pending.  IV fluids, Zofran, Pepcid initiated. At shift change, patient care transferred to [place] Robinson, PA-C for continued evaluation, follow up of labs, symptom control and determination of disposition. Anticipate discharge if labs stable with symptoms improving.   Final Clinical Impressions(s) / ED Diagnoses   Final diagnoses:  Non-intractable vomiting with nausea, unspecified vomiting type    ED Discharge Orders         Ordered    ondansetron (ZOFRAN ODT) 4 MG disintegrating tablet  Every 8 hours PRN     11/26/19 2038           Emi HolesLaw, Dayanna Pryce M, PA-C 11/26/19 1525    Emi HolesLaw, Emmerson Shuffield M, PA-C 11/27/19 0743    Terrilee FilesButler, Michael C, MD 11/27/19 [phone]

## 2019-11-27 ENCOUNTER — Encounter (HOSPITAL_COMMUNITY): Payer: Self-pay | Admitting: Emergency Medicine

## 2019-11-27 ENCOUNTER — Emergency Department (HOSPITAL_COMMUNITY)
Admission: EM | Admit: 2019-11-27 | Discharge: 2019-11-28 | Disposition: A | Discharge status: Home / Self Care | Payer: Medicaid Other | Attending: Emergency Medicine | Admitting: Emergency Medicine

## 2019-11-27 ENCOUNTER — Other Ambulatory Visit: Payer: Self-pay

## 2019-11-27 DIAGNOSIS — R109 Unspecified abdominal pain: Secondary | ICD-10-CM | POA: Insufficient documentation

## 2019-11-27 DIAGNOSIS — Z5321 Procedure and treatment not carried out due to patient leaving prior to being seen by health care provider: Secondary | ICD-10-CM | POA: Insufficient documentation

## 2019-11-27 MED ORDER — SODIUM CHLORIDE 0.9% FLUSH: 3.0000 mL | Freq: Once | INTRAVENOUS | Status: DC

## 2019-11-27 NOTE — ED Triage Notes (Signed)
Patient arrived with EMS from home reports persistent generalized abdominal pain with emesis onset this week , she was seen here yesterday and was discharged home .

## 2019-11-28 ENCOUNTER — Emergency Department (HOSPITAL_COMMUNITY)
Admission: EM | Admit: 2019-11-28 | Discharge: 2019-11-28 | Disposition: A | Discharge status: Home / Self Care | Payer: Medicaid Other | Source: Home / Self Care | Attending: Emergency Medicine | Admitting: Emergency Medicine

## 2019-11-28 ENCOUNTER — Encounter (HOSPITAL_COMMUNITY): Payer: Self-pay

## 2019-11-28 ENCOUNTER — Other Ambulatory Visit: Payer: Self-pay

## 2019-11-28 DIAGNOSIS — R112 Nausea with vomiting, unspecified: Secondary | ICD-10-CM

## 2019-11-28 DIAGNOSIS — R109 Unspecified abdominal pain: Secondary | ICD-10-CM | POA: Insufficient documentation

## 2019-11-28 DIAGNOSIS — F17210 Nicotine dependence, cigarettes, uncomplicated: Secondary | ICD-10-CM | POA: Insufficient documentation

## 2019-11-28 LAB — RAPID URINE DRUG SCREEN, HOSP PERFORMED
Amphetamines: NOT DETECTED
Barbiturates: NOT DETECTED
Benzodiazepines: NOT DETECTED
Cocaine: NOT DETECTED
Opiates: NOT DETECTED
Tetrahydrocannabinol: POSITIVE — AB

## 2019-11-28 LAB — CBC
HCT: 39.5 % (ref 36.0–46.0)
Hemoglobin: 14.6 g/dL (ref 12.0–15.0)
MCH: 28.2 pg (ref 26.0–34.0)
MCHC: 37 g/dL — ABNORMAL HIGH (ref 30.0–36.0)
MCV: 76.3 fL — ABNORMAL LOW (ref 80.0–100.0)
Platelets: 258 10*3/uL (ref 150–400)
RBC: 5.18 MIL/uL — ABNORMAL HIGH (ref 3.87–5.11)
RDW: 12.9 % (ref 11.5–15.5)
WBC: 14.2 10*3/uL — ABNORMAL HIGH (ref 4.0–10.5)
nRBC: 0 % (ref 0.0–0.2)

## 2019-11-28 LAB — COMPREHENSIVE METABOLIC PANEL
ALT: 18 U/L (ref 0–44)
AST: 20 U/L (ref 15–41)
Albumin: 4.7 g/dL (ref 3.5–5.0)
Alkaline Phosphatase: 44 U/L (ref 38–126)
Anion gap: 14 (ref 5–15)
BUN: <5 mg/dL — ABNORMAL LOW (ref 6–20)
CO2: 22 mmol/L (ref 22–32)
Calcium: 9.8 mg/dL (ref 8.9–10.3)
Chloride: 101 mmol/L (ref 98–111)
Creatinine, Ser: 0.81 mg/dL (ref 0.44–1.00)
GFR calc Af Amer: >60 mL/min (ref >60)
GFR calc non Af Amer: >60 mL/min (ref >60)
Glucose, Bld: 110 mg/dL — ABNORMAL HIGH (ref 70–99)
Potassium: 3.3 mmol/L — ABNORMAL LOW (ref 3.5–5.1)
Sodium: 137 mmol/L (ref 135–145)
Total Bilirubin: 1.4 mg/dL — ABNORMAL HIGH (ref 0.3–1.2)
Total Protein: 7.7 g/dL (ref 6.5–8.1)

## 2019-11-28 LAB — URINALYSIS, ROUTINE W REFLEX MICROSCOPIC
Bilirubin Urine: NEGATIVE
Glucose, UA: NEGATIVE mg/dL
Ketones, ur: NEGATIVE mg/dL
Leukocytes,Ua: NEGATIVE
Nitrite: NEGATIVE
Protein, ur: NEGATIVE mg/dL
Specific Gravity, Urine: 1.004 — ABNORMAL LOW (ref 1.005–1.030)
pH: 7 (ref 5.0–8.0)

## 2019-11-28 LAB — I-STAT BETA HCG BLOOD, ED (MC, WL, AP ONLY): I-stat hCG, quantitative: <5 m[IU]/mL (ref <5)

## 2019-11-28 LAB — LIPASE, BLOOD: Lipase: 49 U/L (ref 11–51)
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
## 2019-11-28 MED ORDER — METOCLOPRAMIDE HCL 5 MG/ML IJ SOLN: 10.0000 mg | Freq: Once | INTRAMUSCULAR | Status: DC

## 2019-11-28 MED ORDER — SODIUM CHLORIDE 0.9% FLUSH: 3.0000 mL | Freq: Once | INTRAVENOUS | Status: DC

## 2019-11-28 MED ORDER — ALUM & MAG HYDROXIDE-SIMETH 200-200-20 MG/5ML PO SUSP
30.0000 mL | Freq: Once | ORAL | Status: AC
  Administered 2019-11-28: 30 mL via ORAL
  Filled 2019-11-28: qty 30

## 2019-11-28 MED ORDER — METOCLOPRAMIDE HCL 5 MG/ML IJ SOLN
10.0000 mg | Freq: Once | INTRAMUSCULAR | Status: AC
  Administered 2019-11-28: 10 mg via INTRAMUSCULAR
  Filled 2019-11-28: qty 2

## 2019-11-28 MED ORDER — LORAZEPAM 1 MG PO TABS
1.0000 mg | ORAL_TABLET | Freq: Once | ORAL | Status: AC
  Administered 2019-11-28: 1 mg via ORAL
  Filled 2019-11-28: qty 1

## 2019-11-28 MED ORDER — FAMOTIDINE 20 MG PO TABS
20.0000 mg | ORAL_TABLET | Freq: Once | ORAL | Status: AC
  Administered 2019-11-28: 20 mg via ORAL
  Filled 2019-11-28: qty 1

## 2019-11-28 MED ORDER — CAPSAICIN 0.025 % EX CREA
TOPICAL_CREAM | Freq: Once | CUTANEOUS | Status: AC
  Administered 2019-11-28: 05:00:00 via TOPICAL
  Filled 2019-11-28: qty 60

## 2019-11-28 MED ORDER — DICYCLOMINE HCL 10 MG PO CAPS
10.0000 mg | ORAL_CAPSULE | Freq: Once | ORAL | Status: AC
  Administered 2019-11-28: 10 mg via ORAL
  Filled 2019-11-28: qty 1

## 2019-11-28 NOTE — ED Notes (Signed)
Pt ready to walk out but decided to stay a little longer  No movement in the back

## 2019-11-28 NOTE — ED Notes (Signed)
The pt decided to leave and go to [facility] she called [facility] and was told they had a 30 minute wait

## 2019-11-28 NOTE — ED Notes (Addendum)
Patient endorses nausea and vomiting at this time. EDPA made aware.

## 2019-11-28 NOTE — Discharge Instructions (Signed)

## 2019-11-28 NOTE — ED Notes (Signed)
Patient states that she feels as if her nausea has improved. Water and ice chips provided.

## 2019-11-28 NOTE — ED Triage Notes (Signed)
Pt reports vomiting for the last 3 days. States that she was seen on 12/5, but her symptoms persist. States that the emesis was brown at first, but now is more bilious. Endorses generalized abdominal pain.

## 2019-11-28 NOTE — ED Provider Notes (Signed)
[facility] DEPT Provider Note   CSN: [id number] Arrival date & time: 11/28/19  0335     History   Chief Complaint Chief Complaint  Patient presents with  . Emesis    HPI [name] Weber is a 23 y.o. female.     HPI   Pt is a 23 y/o female with a h/o STI, UTI, who presents to the ED today for eval of abdominal pain nausea and vomiting.  Patient states that she was seen at [facility], ED 2 days ago for evaluation of similar symptoms.  She was discharged with antiemetics which she states she has been taking but she continues to vomit at home.  She is not sure how many times she is vomited.  Abdominal pain is epigastric in nature.  She denies any fevers.  Denies diarrhea, constipation, urinary symptoms.  She has had history of similar presentations which she states has been related to different things like acid reflux.  She states she has also been told her symptoms are related to her marijuana use.  She last used earlier this week.  Past Medical History:  Diagnosis Date  . Chlamydia infection   . Trichomonas 01/2012  . UTI (lower urinary tract infection)     Patient Active Problem List   Diagnosis Date Noted  . Cannabinoid hyperemesis syndrome 11/22/2018  . Alcohol abuse 11/22/2018  . Intractable nausea and vomiting 11/21/2018  . Hypokalemia 11/21/2018  . Sickle cell trait ([place]) 07/03/2014    Past Surgical History:  Procedure Laterality Date  . NO PAST SURGERIES       OB History    Gravida  4   Para  3   Term  3   Preterm      AB  1   Living  3     SAB      TAB  0   Ectopic      Multiple      Live Births  3            Home Medications    Prior to Admission medications   Medication Sig Start Date End Date Taking? Authorizing Provider  acetaminophen (TYLENOL) 500 MG tablet Take 1,000 mg by mouth every 6 (six) hours as needed for moderate pain.    Yes [provider]  famotidine (PEPCID) 10 MG tablet  Take 10 mg by mouth daily as needed for heartburn or indigestion.   Yes [provider]  FLUoxetine (PROZAC) 20 MG capsule Take 40 mg by mouth daily.  09/13/19  Yes [provider]  ondansetron (ZOFRAN ODT) 4 MG disintegrating tablet Take 1 tablet (4 mg total) by mouth every 8 (eight) hours as needed for nausea. 11/26/19  Yes Robinson, [place] N, PA-C  Oxcarbazepine (TRILEPTAL) 300 MG tablet Take 300 mg by mouth 2 (two) times daily. 09/13/19  Yes [provider]  pantoprazole (PROTONIX) 40 MG tablet Take 40 mg by mouth daily. 08/08/19  Yes [provider]  QUEtiapine (SEROQUEL) 50 MG tablet Take 50 mg by mouth at bedtime. 09/13/19  Yes [provider]  metroNIDAZOLE (FLAGYL) 500 MG tablet Take 1 tablet (500 mg total) by mouth 2 (two) times daily. Patient not taking: Reported on 11/26/2019 07/08/19   Woodroe Mode, MD  pantoprazole (PROTONIX) 20 MG tablet Take 1 tablet (20 mg total) by mouth daily. Patient not taking: Reported on 09/24/2019 04/23/19   Lacretia Leigh, MD    Family History Family History  Problem  Relation Age of Onset  . Diabetes Maternal Aunt   . Healthy Mother   . Healthy Father   . Alcohol abuse Neg Hx   . Arthritis Neg Hx   . Asthma Neg Hx   . Birth defects Neg Hx   . Cancer Neg Hx   . COPD Neg Hx   . Depression Neg Hx   . Drug abuse Neg Hx   . Early death Neg Hx   . Hearing loss Neg Hx   . Heart disease Neg Hx   . Hyperlipidemia Neg Hx   . Hypertension Neg Hx   . Kidney disease Neg Hx   . Learning disabilities Neg Hx   . Mental illness Neg Hx   . Mental retardation Neg Hx   . Miscarriages / Stillbirths Neg Hx   . Stroke Neg Hx   . Vision loss Neg Hx     Social History Social History   Tobacco Use  . Smoking status: Current Every Day Smoker    Packs/day: 0.50    Types: Cigarettes  . Smokeless tobacco: Never Used  Substance Use Topics  . Alcohol use: Yes  . Drug use: Yes    Types: Marijuana    Comment:  occasionally     Allergies   Patient has no known allergies.   Review of Systems Review of Systems  Constitutional: Negative for fever.  HENT: Negative for ear pain and sore throat.   Eyes: Negative for visual disturbance.  Respiratory: Negative for cough and shortness of breath.   Cardiovascular: Negative for chest pain.  Gastrointestinal: Positive for abdominal pain, nausea and vomiting. Negative for constipation and diarrhea.  Genitourinary: Negative for dysuria and hematuria.  Musculoskeletal: Negative for back pain.  Skin: Negative for rash.  Neurological: Negative for headaches.  All other systems reviewed and are negative.    Physical Exam Updated Vital Signs BP (!) 134/97 (BP Location: Left Arm)   Pulse 99   Temp 98.3 F (36.8 C) (Oral)   Resp 18   Ht 5\' 3"  (1.6 m)   Wt 56.7 kg   LMP 11/14/2019   SpO2 95%   BMI 22.14 kg/m   Physical Exam Vitals signs and nursing note reviewed.  Constitutional:      General: She is not in acute distress.    Appearance: She is well-developed.  HENT:     Head: Normocephalic and atraumatic.  Eyes:     Conjunctiva/sclera: Conjunctivae normal.  Neck:     Musculoskeletal: Neck supple.  Cardiovascular:     Rate and Rhythm: Normal rate and regular rhythm.     Heart sounds: Normal heart sounds. No murmur.  Pulmonary:     Effort: Pulmonary effort is normal. No respiratory distress.     Breath sounds: Normal breath sounds.  Abdominal:     General: Bowel sounds are normal. There is no distension.     Palpations: Abdomen is soft.     Tenderness: There is no abdominal tenderness. There is no guarding or rebound.  Skin:    General: Skin is warm and dry.  Neurological:     Mental Status: She is alert.     ED Treatments / Results  Labs (all labs ordered are listed, but only abnormal results are displayed) Labs Reviewed - No data to display  EKG None  Radiology No results found.  Procedures Procedures (including  critical care time)  Medications Ordered in ED Medications  famotidine (PEPCID) tablet 20 mg (20 mg Oral Given 11/28/19  0423)  alum & mag hydroxide-simeth (MAALOX/MYLANTA) 200-200-20 MG/5ML suspension 30 mL (30 mLs Oral Given 11/28/19 0423)  dicyclomine (BENTYL) capsule 10 mg (10 mg Oral Given 11/28/19 0423)  metoCLOPramide (REGLAN) injection 10 mg (10 mg Intramuscular Given 11/28/19 0423)  capsaicin (ZOSTRIX) 0.025 % cream ( Topical Given 11/28/19 0527)  LORazepam (ATIVAN) tablet 1 mg (1 mg Oral Given 11/28/19 0527)     Initial Impression / Assessment and Plan / ED Course  I have reviewed the triage vital signs and the nursing notes.  Pertinent labs & imaging results that were available during my care of the patient were reviewed by me and considered in my medical decision making (see chart for details).   Final Clinical Impressions(s) / ED Diagnoses   Final diagnoses:  Abdominal pain, unspecified abdominal location  Non-intractable vomiting with nausea, unspecified vomiting type   23 year old female presenting for evaluation of abdominal pain, nausea and vomiting.  History of gastritis and cannabis hyperemesis syndrome.  Also with history of GERD.  Was seen in the ED 2 days ago for similar symptoms after binge drinking the night before for her birthday.  Also reports marijuana use.  Her work-up at that time was reassuring and she was discharged home with antiemetics which she states she has been taking but still continues to vomit.  [facility] and had labs drawn already. She left without being seen. Reviewed labs from this visit:  CBC with mild leukocytosis, improved from 2 days ago. No anemia CMP with mild hypokalemia, mildly elevated bilirubin. Normal lfts and kidney function Lipase negative Preg test neg UDS + for THC UA with hematuria, and rare bacteria.   Pt given antiemetics, bentyl, pepcid, and GI cocktail. On reassessment she no longer has any nausea and states her abd  pain has improved other than some stinging to her skin from the capsaicin cream. She has been able to tolerate po. Suspect sxs secondary to cannabis hyperemesis syndrome. Doubt emergent pathology of sxs. Advised pcp f/u and return precautions. She voices understanding and is in agreement with plan. All questions answered, pt stable for d/c.  ED Discharge Orders    None       Rayne DuCouture, Derry Kassel S, PA-C 11/28/19 0552    Dione BoozeGlick, David, MD 11/28/19 0700

## 2019-11-28 NOTE — ED Notes (Addendum)
Pt has decided to leave. IV taken out of lt ac

## 2020-02-16 ENCOUNTER — Encounter (HOSPITAL_COMMUNITY): Payer: Self-pay | Admitting: Emergency Medicine

## 2020-02-16 ENCOUNTER — Other Ambulatory Visit: Payer: Self-pay

## 2020-02-16 ENCOUNTER — Ambulatory Visit (HOSPITAL_COMMUNITY)
Admission: EM | Admit: 2020-02-16 | Discharge: 2020-02-16 | Payer: Medicaid Other | Attending: Family Medicine | Admitting: Family Medicine

## 2020-02-16 ENCOUNTER — Emergency Department (HOSPITAL_COMMUNITY)
Admission: EM | Admit: 2020-02-16 | Discharge: 2020-02-16 | Discharge status: Against Medical Advice | Payer: Medicaid Other | Attending: Emergency Medicine | Admitting: Emergency Medicine

## 2020-02-16 ENCOUNTER — Encounter (HOSPITAL_COMMUNITY): Payer: Self-pay

## 2020-02-16 DIAGNOSIS — R1084 Generalized abdominal pain: Secondary | ICD-10-CM

## 2020-02-16 DIAGNOSIS — F17210 Nicotine dependence, cigarettes, uncomplicated: Secondary | ICD-10-CM | POA: Insufficient documentation

## 2020-02-16 DIAGNOSIS — Z20822 Contact with and (suspected) exposure to covid-19: Secondary | ICD-10-CM | POA: Insufficient documentation

## 2020-02-16 DIAGNOSIS — Z79899 Other long term (current) drug therapy: Secondary | ICD-10-CM | POA: Insufficient documentation

## 2020-02-16 DIAGNOSIS — D72829 Elevated white blood cell count, unspecified: Secondary | ICD-10-CM | POA: Insufficient documentation

## 2020-02-16 DIAGNOSIS — R112 Nausea with vomiting, unspecified: Secondary | ICD-10-CM

## 2020-02-16 DIAGNOSIS — D573 Sickle-cell trait: Secondary | ICD-10-CM | POA: Insufficient documentation

## 2020-02-16 DIAGNOSIS — Z87440 Personal history of urinary (tract) infections: Secondary | ICD-10-CM | POA: Insufficient documentation

## 2020-02-16 DIAGNOSIS — Z5320 Procedure and treatment not carried out because of patient's decision for unspecified reasons: Secondary | ICD-10-CM | POA: Diagnosis not present

## 2020-02-16 DIAGNOSIS — Z3202 Encounter for pregnancy test, result negative: Secondary | ICD-10-CM

## 2020-02-16 LAB — COMPREHENSIVE METABOLIC PANEL
ALT: 28 U/L (ref 0–44)
ALT: 29 U/L (ref 0–44)
AST: 25 U/L (ref 15–41)
AST: 25 U/L (ref 15–41)
Albumin: 4.7 g/dL (ref 3.5–5.0)
Albumin: 4.7 g/dL (ref 3.5–5.0)
Alkaline Phosphatase: 43 U/L (ref 38–126)
Alkaline Phosphatase: 45 U/L (ref 38–126)
Anion gap: 13 (ref 5–15)
Anion gap: 13 (ref 5–15)
BUN: 11 mg/dL (ref 6–20)
BUN: 11 mg/dL (ref 6–20)
CO2: 21 mmol/L — ABNORMAL LOW (ref 22–32)
CO2: 23 mmol/L (ref 22–32)
Calcium: 9.7 mg/dL (ref 8.9–10.3)
Calcium: 9.6 mg/dL (ref 8.9–10.3)
Chloride: 104 mmol/L (ref 98–111)
Chloride: 102 mmol/L (ref 98–111)
Creatinine, Ser: 0.82 mg/dL (ref 0.44–1.00)
Creatinine, Ser: 0.83 mg/dL (ref 0.44–1.00)
GFR calc Af Amer: >60 mL/min (ref >60)
GFR calc Af Amer: >60 mL/min (ref >60)
GFR calc non Af Amer: >60 mL/min (ref >60)
GFR calc non Af Amer: >60 mL/min (ref >60)
Glucose, Bld: 153 mg/dL — ABNORMAL HIGH (ref 70–99)
Glucose, Bld: 156 mg/dL — ABNORMAL HIGH (ref 70–99)
Potassium: 3.5 mmol/L (ref 3.5–5.1)
Potassium: 3.5 mmol/L (ref 3.5–5.1)
Sodium: 138 mmol/L (ref 135–145)
Sodium: 138 mmol/L (ref 135–145)
Total Bilirubin: 0.9 mg/dL (ref 0.3–1.2)
Total Bilirubin: 0.6 mg/dL (ref 0.3–1.2)
Total Protein: 7.5 g/dL (ref 6.5–8.1)
Total Protein: 7.7 g/dL (ref 6.5–8.1)

## 2020-02-16 LAB — POCT URINALYSIS DIP (DEVICE)
Glucose, UA: NEGATIVE mg/dL
Ketones, ur: NEGATIVE mg/dL
Leukocytes,Ua: NEGATIVE
Nitrite: NEGATIVE
Protein, ur: 30 mg/dL — AB
Specific Gravity, Urine: 1.02 (ref 1.005–1.030)
Urobilinogen, UA: 1 mg/dL (ref 0.0–1.0)
pH: 8.5 — ABNORMAL HIGH (ref 5.0–8.0)

## 2020-02-16 LAB — CBC WITH DIFFERENTIAL/PLATELET
Abs Immature Granulocytes: 0.19 10*3/uL — ABNORMAL HIGH (ref 0.00–0.07)
Basophils Absolute: 0.1 10*3/uL (ref 0.0–0.1)
Basophils Relative: 1 %
Eosinophils Absolute: 0 10*3/uL (ref 0.0–0.5)
Eosinophils Relative: 0 %
HCT: 40.3 % (ref 36.0–46.0)
Hemoglobin: 14 g/dL (ref 12.0–15.0)
Immature Granulocytes: 1 %
Lymphocytes Relative: 10 %
Lymphs Abs: 2.4 10*3/uL (ref 0.7–4.0)
MCH: 27.9 pg (ref 26.0–34.0)
MCHC: 34.7 g/dL (ref 30.0–36.0)
MCV: 80.4 fL (ref 80.0–100.0)
Monocytes Absolute: 1 10*3/uL (ref 0.1–1.0)
Monocytes Relative: 4 %
Neutro Abs: 19.9 10*3/uL — ABNORMAL HIGH (ref 1.7–7.7)
Neutrophils Relative %: 84 %
Platelets: 307 10*3/uL (ref 150–400)
RBC: 5.01 MIL/uL (ref 3.87–5.11)
RDW: 13.3 % (ref 11.5–15.5)
WBC: 23.6 10*3/uL — ABNORMAL HIGH (ref 4.0–10.5)
nRBC: 0 % (ref 0.0–0.2)

## 2020-02-16 LAB — LIPASE, BLOOD
Lipase: 29 U/L (ref 11–51)
Lipase: 27 U/L (ref 11–51)

## 2020-02-16 LAB — LACTIC ACID, PLASMA: Lactic Acid, Venous: 2.3 mmol/L (ref 0.5–1.9)

## 2020-02-16 LAB — POC URINE PREG, ED: Preg Test, Ur: NEGATIVE

## 2020-02-16 LAB — CBC
HCT: 39.7 % (ref 36.0–46.0)
Hemoglobin: 14.3 g/dL (ref 12.0–15.0)
MCH: 28.2 pg (ref 26.0–34.0)
MCHC: 36 g/dL (ref 30.0–36.0)
MCV: 78.3 fL — ABNORMAL LOW (ref 80.0–100.0)
Platelets: 308 10*3/uL (ref 150–400)
RBC: 5.07 MIL/uL (ref 3.87–5.11)
RDW: 13.2 % (ref 11.5–15.5)
WBC: 21.3 10*3/uL — ABNORMAL HIGH (ref 4.0–10.5)
nRBC: 0 % (ref 0.0–0.2)

## 2020-02-16 LAB — POCT PREGNANCY, URINE: Preg Test, Ur: NEGATIVE

## 2020-02-16 MED ORDER — ONDANSETRON 4 MG PO TBDP
4.0000 mg | ORAL_TABLET | Freq: Once | ORAL | Status: AC
  Administered 2020-02-16: 14:00:00 4 mg via ORAL

## 2020-02-16 MED ORDER — SODIUM CHLORIDE 0.9% FLUSH: 3.0000 mL | Freq: Once | INTRAVENOUS | Status: DC

## 2020-02-16 MED ORDER — METOCLOPRAMIDE HCL 5 MG/ML IJ SOLN
INTRAMUSCULAR | Status: AC
  Filled 2020-02-16: qty 2

## 2020-02-16 MED ORDER — METOCLOPRAMIDE HCL 5 MG/ML IJ SOLN
10.0000 mg | Freq: Once | INTRAMUSCULAR | Status: AC
  Administered 2020-02-16: 10 mg via INTRAMUSCULAR

## 2020-02-16 MED ORDER — ONDANSETRON 4 MG PO TBDP
ORAL_TABLET | ORAL | Status: AC
  Filled 2020-02-16: qty 1

## 2020-02-16 MED ORDER — SODIUM CHLORIDE 0.9 % IV BOLUS
1000.0000 mL | Freq: Once | INTRAVENOUS | Status: AC
  Administered 2020-02-16: 17:00:00 1000 mL via INTRAVENOUS

## 2020-02-16 NOTE — ED Triage Notes (Signed)
Pt in POV, sent by [facility] for WBC 21. Reports gen abd pain, N/V X few days. States she has been unable to keep solids/liquids down.

## 2020-02-16 NOTE — Discharge Instructions (Signed)
Acute onset abdominal pain, nausea and vomiting, WBC 21 Intractable to zofran, 10 mg reglan given at 1506

## 2020-02-16 NOTE — ED Provider Notes (Signed)
[facility]    CSN: [id number] Arrival date & time: 02/16/20  1346      History   Chief Complaint Chief Complaint  Patient presents with  . Emesis  . Abdominal Pain    HPI [name] Weber is a 24 y.o. female presenting today for evaluation of nausea vomiting and abdominal pain.  Patient states that over the past couple of days she has had nausea, today around 10 AM she developed vomiting which has been persistent with associated abdominal pain.  Reports abdominal pain is generalized and is described as a cramping sensation.  More prominent on left compared to right.  Waxes and wanes.  Denies any blood in vomit.  Denies persistent diarrhea, but noted to have looser stools while here at the urgent care.  Denies any URI symptoms.  Denies close sick contacts.  Does report drinking 2 glasses of wine yesterday and reports marijuana use.  Denies history of similar with marijuana use.  On chart review appears to have multiple visits to the ED for intractable nausea and vomiting-patient states this feels different than prior visits.  Patient also reports that she has recently had some irregular bleeding.  Had normal menstrual cycle that ended on 2/17, had intercourse that re-triggered bleeding for couple of days.  She denies any pelvic pain or abnormal vaginal discharge.  HPI  Past Medical History:  Diagnosis Date  . Chlamydia infection   . Trichomonas 01/2012  . UTI (lower urinary tract infection)     Patient Active Problem List   Diagnosis Date Noted  . Cannabinoid hyperemesis syndrome 11/22/2018  . Alcohol abuse 11/22/2018  . Intractable nausea and vomiting 11/21/2018  . Hypokalemia 11/21/2018  . Sickle cell trait ([place]) 07/03/2014    Past Surgical History:  Procedure Laterality Date  . NO PAST SURGERIES      OB History    Gravida  4   Para  3   Term  3   Preterm      AB  1   Living  3     SAB      TAB  0   Ectopic      Multiple      Live Births   3            Home Medications    Prior to Admission medications   Medication Sig Start Date End Date Taking? Authorizing Provider  famotidine (PEPCID) 10 MG tablet Take 10 mg by mouth daily as needed for heartburn or indigestion.   Yes [provider]  FLUoxetine (PROZAC) 20 MG capsule Take 40 mg by mouth daily.  09/13/19  Yes [provider]  metoCLOPramide (REGLAN) 10 MG tablet Take 10 mg by mouth 4 (four) times daily. 11/28/19  Yes [provider]  Oxcarbazepine (TRILEPTAL) 300 MG tablet Take 300 mg by mouth 2 (two) times daily. 09/13/19  Yes [provider]  pantoprazole (PROTONIX) 20 MG tablet Take 1 tablet (20 mg total) by mouth daily. 04/23/19  Yes Lacretia Leigh, MD  potassium chloride (KLOR-CON) 20 MEQ packet 2 (two) times daily. 11/28/19  Yes [provider]  QUEtiapine (SEROQUEL) 50 MG tablet Take 50 mg by mouth at bedtime. 09/13/19  Yes [provider]  acetaminophen (TYLENOL) 500 MG tablet Take 1,000 mg by mouth every 6 (six) hours as needed for moderate pain.     [provider]  metroNIDAZOLE (FLAGYL) 500 MG tablet Take 1 tablet (500 mg total) by mouth 2 (two)  times daily. Patient not taking: Reported on 11/26/2019 07/08/19   Adam Phenix, MD  ondansetron (ZOFRAN ODT) 4 MG disintegrating tablet Take 1 tablet (4 mg total) by mouth every 8 (eight) hours as needed for nausea. 11/26/19   Robinson, [place] N, PA-C  pantoprazole (PROTONIX) 40 MG tablet Take 40 mg by mouth daily. 08/08/19   [provider]    Family History Family History  Problem Relation Age of Onset  . Diabetes Maternal Aunt   . Healthy Mother   . Healthy Father   . Alcohol abuse Neg Hx   . Arthritis Neg Hx   . Asthma Neg Hx   . Birth defects Neg Hx   . Cancer Neg Hx   . COPD Neg Hx   . Depression Neg Hx   . Drug abuse Neg Hx   . Early death Neg Hx   . Hearing loss Neg Hx   . Heart disease Neg Hx   . Hyperlipidemia Neg Hx   .  Hypertension Neg Hx   . Kidney disease Neg Hx   . Learning disabilities Neg Hx   . Mental illness Neg Hx   . Mental retardation Neg Hx   . Miscarriages / Stillbirths Neg Hx   . Stroke Neg Hx   . Vision loss Neg Hx     Social History Social History   Tobacco Use  . Smoking status: Current Every Day Smoker    Packs/day: 0.50    Types: Cigarettes  . Smokeless tobacco: Never Used  Substance Use Topics  . Alcohol use: Yes  . Drug use: Yes    Types: Marijuana    Comment: occasionally     Allergies   Patient has no known allergies.   Review of Systems Review of Systems  Constitutional: Negative for fever.  Respiratory: Negative for shortness of breath.   Cardiovascular: Negative for chest pain.  Gastrointestinal: Positive for abdominal pain, nausea and vomiting. Negative for diarrhea.  Genitourinary: Negative for dysuria, flank pain, genital sores, hematuria, menstrual problem, vaginal bleeding, vaginal discharge and vaginal pain.  Musculoskeletal: Negative for back pain.  Skin: Negative for rash.  Neurological: Negative for dizziness, light-headedness and headaches.     Physical Exam Triage Vital Signs ED Triage Vitals  Enc Vitals Group     BP 02/16/20 1405 126/86     Pulse Rate 02/16/20 1405 92     Resp 02/16/20 1405 18     Temp 02/16/20 1405 (!) 97.4 F (36.3 C)     Temp Source 02/16/20 1405 Oral     SpO2 02/16/20 1405 98 %     Weight --      Height --      Head Circumference --      Peak Flow --      Pain Score 02/16/20 1401 8     Pain Loc --      Pain Edu? --      Excl. in GC? --    No data found.  Updated Vital Signs BP 126/86 (BP Location: Right Arm)   Pulse 92   Temp (!) 97.4 F (36.3 C) (Oral)   Resp 18   LMP 02/09/2020   SpO2 98%   Visual Acuity Right Eye Distance:   Left Eye Distance:   Bilateral Distance:    Right Eye Near:   Left Eye Near:    Bilateral Near:     Physical Exam Vitals and nursing note reviewed.  Constitutional:  General: She is not in acute distress.    Appearance: She is well-developed.  HENT:     Head: Normocephalic and atraumatic.  Eyes:     Conjunctiva/sclera: Conjunctivae normal.  Cardiovascular:     Rate and Rhythm: Normal rate and regular rhythm.     Heart sounds: No murmur.  Pulmonary:     Effort: Pulmonary effort is normal. No respiratory distress.     Breath sounds: Normal breath sounds.  Abdominal:     Palpations: Abdomen is soft.     Tenderness: There is abdominal tenderness.     Comments: Actively vomiting yellow vomitus in room, abdomen soft, non distended, generalized tenderness throughout left abdomen, no focal tenderness, negative mcburneys, negative rovsings  Musculoskeletal:     Cervical back: Neck supple.  Skin:    General: Skin is warm and dry.  Neurological:     Mental Status: She is alert.      UC Treatments / Results  Labs (all labs ordered are listed, but only abnormal results are displayed) Labs Reviewed  CBC - Abnormal; Notable for the following components:      Result Value   WBC 21.3 (*)    MCV 78.3 (*)    All other components within normal limits  POCT URINALYSIS DIP (DEVICE) - Abnormal; Notable for the following components:   Bilirubin Urine SMALL (*)    Hgb urine dipstick TRACE (*)    pH 8.5 (*)    Protein, ur 30 (*)    All other components within normal limits  NOVEL CORONAVIRUS, NAA (HOSP ORDER, SEND-OUT TO REF LAB; TAT 18-24 HRS)  COMPREHENSIVE METABOLIC PANEL  LIPASE, BLOOD  POC URINE PREG, ED  POCT PREGNANCY, URINE    EKG   Radiology No results found.  Procedures Procedures (including critical care time)  Medications Ordered in UC Medications  ondansetron (ZOFRAN-ODT) disintegrating tablet 4 mg (4 mg Oral Given 02/16/20 1400)  metoCLOPramide (REGLAN) injection 10 mg (10 mg Intramuscular Given 02/16/20 1506)    Initial Impression / Assessment and Plan / UC Course  I have reviewed the triage vital signs and the nursing  notes.  Pertinent labs & imaging results that were available during my care of the patient were reviewed by me and considered in my medical decision making (see chart for details).  Clinical Course as of Feb 15 1529  Thu Feb 16, 2020  1448 Patient still vomiting with zofran, providing IM reglan as alternative   [HW]    Clinical Course User Index [HW] Wieters, Hallie C, PA-C    CBC returned with elevated Rue count of 21, tenderness in association with acute onset of abdominal pain and intractable nausea and vomiting having patient be further evaluated in the emergency room.  Patient continues to vomit after Reglan and Zofran.  She was transported in private vehicle with family member who verbalizes understanding to report straight to ED.    Final Clinical Impressions(s) / UC Diagnoses   Final diagnoses:  Generalized abdominal pain  Intractable vomiting with nausea, unspecified vomiting type  Leukocytosis, unspecified type     Discharge Instructions     Acute onset abdominal pain, nausea and vomiting, WBC 21 Intractable to zofran, 10 mg reglan given at 1506    ED Prescriptions    None     PDMP not reviewed this encounter.   Lew Dawes, [place] 02/16/20 1530

## 2020-02-16 NOTE — ED Triage Notes (Signed)
Pt c/o n/v and abd pain and chills since approx 1000. Denies dysuria sx, sore throat, HA, sinus congestion.  Also states irregular vaginal bleeding the past couple days after normal period this month.

## 2020-02-16 NOTE — ED Provider Notes (Signed)
[facility] EMERGENCY DEPARTMENT Provider Note   CSN: [id number] Arrival date & time: 02/16/20  1536     History No chief complaint on file.   [name] Weber is a 24 y.o. female.  24 year old female presents with complaint of generalized abdominal pain x2 days with nausea and vomiting.  Pain is intermittent, better with lying on her stomach on top of a pillow, improved symptoms after Zofran and injection from urgent care today.  Reports nonbloody, nonbilious emesis.  Sent by urgent care for elevated Sinn blood cell count at 21,000.  Denies fevers, chills, changes in bowel or bladder habits or abnormal vaginal discharge.  Patient has a history of cannabinoid hyperemesis, does admit to smoking marijuana recently.  No other complaints or concerns.        Past Medical History:  Diagnosis Date  . Chlamydia infection   . Trichomonas 01/2012  . UTI (lower urinary tract infection)     Patient Active Problem List   Diagnosis Date Noted  . Cannabinoid hyperemesis syndrome 11/22/2018  . Alcohol abuse 11/22/2018  . Intractable nausea and vomiting 11/21/2018  . Hypokalemia 11/21/2018  . Sickle cell trait (HCC) 07/03/2014    Past Surgical History:  Procedure Laterality Date  . NO PAST SURGERIES       OB History    Gravida  4   Para  3   Term  3   Preterm      AB  1   Living  3     SAB      TAB  0   Ectopic      Multiple      Live Births  3           Family History  Problem Relation Age of Onset  . Diabetes Maternal Aunt   . Healthy Mother   . Healthy Father   . Alcohol abuse Neg Hx   . Arthritis Neg Hx   . Asthma Neg Hx   . Birth defects Neg Hx   . Cancer Neg Hx   . COPD Neg Hx   . Depression Neg Hx   . Drug abuse Neg Hx   . Early death Neg Hx   . Hearing loss Neg Hx   . Heart disease Neg Hx   . Hyperlipidemia Neg Hx   . Hypertension Neg Hx   . Kidney disease Neg Hx   . Learning disabilities Neg Hx   . Mental illness  Neg Hx   . Mental retardation Neg Hx   . Miscarriages / Stillbirths Neg Hx   . Stroke Neg Hx   . Vision loss Neg Hx     Social History   Tobacco Use  . Smoking status: Current Every Day Smoker    Packs/day: 0.50    Types: Cigarettes  . Smokeless tobacco: Never Used  Substance Use Topics  . Alcohol use: Yes  . Drug use: Yes    Types: Marijuana    Comment: occasionally    Home Medications Prior to Admission medications   Medication Sig Start Date End Date Taking? Authorizing Provider  acetaminophen (TYLENOL) 500 MG tablet Take 1,000 mg by mouth every 6 (six) hours as needed for moderate pain.     [provider]  famotidine (PEPCID) 10 MG tablet Take 10 mg by mouth daily as needed for heartburn or indigestion.    [provider]  FLUoxetine (PROZAC) 20 MG capsule Take 40 mg by mouth daily.  09/13/19  [provider]  metoCLOPramide (REGLAN) 10 MG tablet Take 10 mg by mouth 4 (four) times daily. 11/28/19   [provider]  metroNIDAZOLE (FLAGYL) 500 MG tablet Take 1 tablet (500 mg total) by mouth 2 (two) times daily. Patient not taking: Reported on 11/26/2019 07/08/19   Woodroe Mode, MD  ondansetron (ZOFRAN ODT) 4 MG disintegrating tablet Take 1 tablet (4 mg total) by mouth every 8 (eight) hours as needed for nausea. 11/26/19   Robinson, [place] N, PA-C  Oxcarbazepine (TRILEPTAL) 300 MG tablet Take 300 mg by mouth 2 (two) times daily. 09/13/19   [provider]  pantoprazole (PROTONIX) 20 MG tablet Take 1 tablet (20 mg total) by mouth daily. 04/23/19   Lacretia Leigh, MD  pantoprazole (PROTONIX) 40 MG tablet Take 40 mg by mouth daily. 08/08/19   [provider]  potassium chloride (KLOR-CON) 20 MEQ packet 2 (two) times daily. 11/28/19   [provider]  QUEtiapine (SEROQUEL) 50 MG tablet Take 50 mg by mouth at bedtime. 09/13/19   [provider]    Allergies    Patient has no known allergies.  Review of Systems    Review of Systems  Constitutional: Negative for chills, diaphoresis and fever.  Respiratory: Negative for shortness of breath.   Cardiovascular: Negative for chest pain.  Gastrointestinal: Positive for abdominal pain, nausea and vomiting. Negative for blood in stool, constipation and diarrhea.  Genitourinary: Negative for dysuria, frequency, vaginal bleeding and vaginal discharge.  Musculoskeletal: Negative for arthralgias and myalgias.  Skin: Negative for rash and wound.  Allergic/Immunologic: Negative for immunocompromised state.  Neurological: Negative for weakness.  Psychiatric/Behavioral: Negative for confusion.  Weber other systems reviewed and are negative.   Physical Exam Updated Vital Signs BP 140/80   Pulse 84   Temp 97.7 F (36.5 C) (Oral)   Resp 16   Ht 5\' 3"  (1.6 m)   Wt 61.2 kg   LMP 02/09/2020   SpO2 99%   BMI 23.91 kg/m   Physical Exam Vitals and nursing note reviewed.  Constitutional:      General: She is not in acute distress.    Appearance: She is well-developed. She is not diaphoretic.  HENT:     Head: Normocephalic and atraumatic.  Cardiovascular:     Rate and Rhythm: Normal rate and regular rhythm.     Pulses: Normal pulses.     Heart sounds: Normal heart sounds.  Pulmonary:     Effort: Pulmonary effort is normal.     Breath sounds: Normal breath sounds.  Abdominal:     General: There is no distension.     Palpations: Abdomen is soft.     Tenderness: There is no abdominal tenderness. There is no right CVA tenderness or left CVA tenderness.  Musculoskeletal:     Right lower leg: No edema.     Left lower leg: No edema.  Skin:    General: Skin is warm and dry.     Findings: No erythema or rash.  Neurological:     Mental Status: She is alert and oriented to person, place, and time.  Psychiatric:        Behavior: Behavior normal.     ED Results / Procedures / Treatments   Labs (Weber labs ordered are listed, but only abnormal results are  displayed) Labs Reviewed  COMPREHENSIVE METABOLIC PANEL - Abnormal; Notable for the following components:      Result Value   Glucose, Bld 156 (*)    Weber  other components within normal limits  CBC WITH DIFFERENTIAL/PLATELET - Abnormal; Notable for the following components:   WBC 23.6 (*)    Neutro Abs 19.9 (*)    Abs Immature Granulocytes 0.19 (*)    Weber other components within normal limits  LACTIC ACID, PLASMA - Abnormal; Notable for the following components:   Lactic Acid, Venous 2.3 (*)    Weber other components within normal limits  LIPASE, BLOOD  LACTIC ACID, PLASMA    EKG None  Radiology No results found.  Procedures Procedures (including critical care time)  Medications Ordered in ED Medications  sodium chloride flush (NS) 0.9 % injection 3 mL (3 mLs Intravenous Not Given 02/16/20 1657)  sodium chloride 0.9 % bolus 1,000 mL (0 mLs Intravenous Stopped 02/16/20 1726)    ED Course  I have reviewed the triage vital signs and the nursing notes.  Pertinent labs & imaging results that were available during my care of the patient were reviewed by me and considered in my medical decision making (see chart for details).  Clinical Course as of Feb 15 1818  Thu Feb 16, 2020  6389 24 year old female presents with complaint of nausea and vomiting with generalized abdominal pain, history of cyclic vomiting/cannabis hyperemesis in the past.  Patient was given an injection of medication as well as Zofran ODT at urgent care, sent for elevated Greth blood cell count, arrived in the ER saying that she is feeling better.  On exam, abdomen is soft and nontender, patient is nontoxic-appearing.  Plan was for IV fluids and labs.  IV fluids initiated, lab work drawn, does show elevated Abdelrahman count at 23.6 with increase in neutrophils, CMP without significant findings, lipase within normal limits.  Lactic acid was pending at the time the patient decided to leave AGAINST MEDICAL ADVICE, stating that  she is feeling much better and would like to go home at this time.  Due to incomplete work-up, patient did leave AGAINST MEDICAL ADVICE, advised to return to the ER at any time. After leaving, patient was found to have increased lactic acid at 2.3.   [LM]    Clinical Course User Index [LM] Alden Hipp   MDM Rules/Calculators/A&P                     Final Clinical Impression(s) / ED Diagnoses Final diagnoses:  Non-intractable vomiting with nausea, unspecified vomiting type  Generalized abdominal pain  Leukocytosis, unspecified type    Rx / DC Orders ED Discharge Orders    None       Jeannie Fend, PA-C 02/16/20 1819    Terald Sleeper, MD 02/17/20 1135

## 2020-02-16 NOTE — Discharge Instructions (Addendum)
Follow-up with your primary care provider, return to the emergency room at anytime for further work-up.

## 2020-02-16 NOTE — ED Notes (Signed)
Patient is being discharged from the Urgent Care Center and sent to the Emergency Department via personal vehicle by family. Per provider Patterson Hammersmith, patient is stable but in need of higher level of care due to critical labs. Patient is aware and verbalizes understanding of plan of care.    Vitals:   02/16/20 1405  BP: 126/86  Pulse: 92  Resp: 18  Temp: (!) 97.4 F (36.3 C)  SpO2: 98%

## 2020-02-16 NOTE — ED Notes (Signed)
Pt has taken out her own IV. Pt has expressed several times that she wants to leave. Pt endorses that she is going to visit her primary care doctor tomorrow. Pt Got dressed and walked out of the ED without her discharge papers, leaving against medical advice. Pt ambulated out of the ED without difficulties. MD informed of pt leaving.

## 2020-02-17 ENCOUNTER — Other Ambulatory Visit: Payer: Self-pay

## 2020-02-17 ENCOUNTER — Encounter (HOSPITAL_COMMUNITY): Payer: Self-pay | Admitting: Emergency Medicine

## 2020-02-17 ENCOUNTER — Emergency Department (HOSPITAL_COMMUNITY)
Admission: EM | Admit: 2020-02-17 | Discharge: 2020-02-17 | Disposition: A | Discharge status: Home / Self Care | Payer: Medicaid Other | Attending: Emergency Medicine | Admitting: Emergency Medicine

## 2020-02-17 DIAGNOSIS — R112 Nausea with vomiting, unspecified: Secondary | ICD-10-CM | POA: Insufficient documentation

## 2020-02-17 DIAGNOSIS — E876 Hypokalemia: Secondary | ICD-10-CM | POA: Diagnosis not present

## 2020-02-17 DIAGNOSIS — F17210 Nicotine dependence, cigarettes, uncomplicated: Secondary | ICD-10-CM | POA: Insufficient documentation

## 2020-02-17 DIAGNOSIS — Z79899 Other long term (current) drug therapy: Secondary | ICD-10-CM | POA: Insufficient documentation

## 2020-02-17 LAB — COMPREHENSIVE METABOLIC PANEL
ALT: 30 U/L (ref 0–44)
AST: 32 U/L (ref 15–41)
Albumin: 5.1 g/dL — ABNORMAL HIGH (ref 3.5–5.0)
Alkaline Phosphatase: 44 U/L (ref 38–126)
Anion gap: 13 (ref 5–15)
BUN: 13 mg/dL (ref 6–20)
CO2: 22 mmol/L (ref 22–32)
Calcium: 10.1 mg/dL (ref 8.9–10.3)
Chloride: 104 mmol/L (ref 98–111)
Creatinine, Ser: 1.11 mg/dL — ABNORMAL HIGH (ref 0.44–1.00)
GFR calc Af Amer: >60 mL/min (ref >60)
GFR calc non Af Amer: >60 mL/min (ref >60)
Glucose, Bld: 151 mg/dL — ABNORMAL HIGH (ref 70–99)
Potassium: 3.1 mmol/L — ABNORMAL LOW (ref 3.5–5.1)
Sodium: 139 mmol/L (ref 135–145)
Total Bilirubin: 1.8 mg/dL — ABNORMAL HIGH (ref 0.3–1.2)
Total Protein: 8.1 g/dL (ref 6.5–8.1)

## 2020-02-17 LAB — CBC
HCT: 39.7 % (ref 36.0–46.0)
Hemoglobin: 14.2 g/dL (ref 12.0–15.0)
MCH: 28 pg (ref 26.0–34.0)
MCHC: 35.8 g/dL (ref 30.0–36.0)
MCV: 78.1 fL — ABNORMAL LOW (ref 80.0–100.0)
Platelets: 314 10*3/uL (ref 150–400)
RBC: 5.08 MIL/uL (ref 3.87–5.11)
RDW: 13 % (ref 11.5–15.5)
WBC: 18.7 10*3/uL — ABNORMAL HIGH (ref 4.0–10.5)
nRBC: 0 % (ref 0.0–0.2)

## 2020-02-17 LAB — LIPASE, BLOOD: Lipase: 19 U/L (ref 11–51)

## 2020-02-17 LAB — URINALYSIS, ROUTINE W REFLEX MICROSCOPIC
Bilirubin Urine: NEGATIVE
Glucose, UA: NEGATIVE mg/dL
Ketones, ur: 20 mg/dL — AB
Leukocytes,Ua: NEGATIVE
Nitrite: NEGATIVE
Protein, ur: 100 mg/dL — AB
Specific Gravity, Urine: 1.021 (ref 1.005–1.030)
pH: 6 (ref 5.0–8.0)

## 2020-02-17 LAB — I-STAT BETA HCG BLOOD, ED (MC, WL, AP ONLY): I-stat hCG, quantitative: <5 m[IU]/mL (ref <5)

## 2020-02-17 MED ORDER — DROPERIDOL 2.5 MG/ML IJ SOLN
2.5000 mg | Freq: Once | INTRAMUSCULAR | Status: AC
  Administered 2020-02-17: 16:00:00 2.5 mg via INTRAVENOUS
  Filled 2020-02-17: qty 2

## 2020-02-17 MED ORDER — LACTATED RINGERS IV BOLUS
1000.0000 mL | Freq: Once | INTRAVENOUS | Status: AC
  Administered 2020-02-17: 1000 mL via INTRAVENOUS

## 2020-02-17 MED ORDER — ONDANSETRON 4 MG PO TBDP
4.0000 mg | ORAL_TABLET | Freq: Once | ORAL | Status: AC
  Administered 2020-02-17: 4 mg via ORAL

## 2020-02-17 MED ORDER — POTASSIUM CHLORIDE 10 MEQ/100ML IV SOLN
10.0000 meq | INTRAVENOUS | Status: AC
  Administered 2020-02-17 (×2): 10 meq via INTRAVENOUS
  Filled 2020-02-17 (×2): qty 100

## 2020-02-17 MED ORDER — ONDANSETRON 4 MG PO TBDP
ORAL_TABLET | ORAL | Status: AC
  Filled 2020-02-17: qty 1

## 2020-02-17 MED ORDER — SODIUM CHLORIDE 0.9% FLUSH: 3.0000 mL | Freq: Once | INTRAVENOUS | Status: DC

## 2020-02-17 NOTE — ED Notes (Signed)
Pt state having pain on left side of the chest 10 in 0 10 scale

## 2020-02-17 NOTE — ED Triage Notes (Signed)
Seen here yesterday for nausea and vomiting-- now has dry heaves, had to leave yesterday to take care of kids-- will stay today-- c/o abd pain, has WBC 21 yesterday, chills.

## 2020-02-17 NOTE — ED Provider Notes (Signed)
[facility] EMERGENCY DEPARTMENT Provider Note   CSN: [id number] Arrival date & time: 02/17/20  1248     History Chief Complaint  Patient presents with  . Nausea  . Emesis    [name] Weber is a 24 y.o. female.  The history is provided by the patient and medical records.  Abdominal Pain Pain location:  Periumbilical and LUQ Pain quality: cramping   Pain radiates to:  Does not radiate Pain severity:  Moderate Onset quality:  Gradual Duration:  2 days Timing:  Constant Progression:  Worsening Chronicity:  Recurrent Context: retching   Context: not trauma   Context comment:  Marijuana use Relieved by:  Heat Worsened by:  Palpation and eating Associated symptoms: nausea   Associated symptoms: no chest pain, no constipation, no cough, no diarrhea, no dysuria, no fever, no hematemesis, no hematochezia, no hematuria, no melena, no shortness of breath, no vaginal bleeding, no vaginal discharge and no vomiting   Risk factors: not elderly, has not had multiple surgeries, not obese and not pregnant        Past Medical History:  Diagnosis Date  . Chlamydia infection   . Trichomonas 01/2012  . UTI (lower urinary tract infection)     Patient Active Problem List   Diagnosis Date Noted  . Cannabinoid hyperemesis syndrome 11/22/2018  . Alcohol abuse 11/22/2018  . Intractable nausea and vomiting 11/21/2018  . Hypokalemia 11/21/2018  . Sickle cell trait (HCC) 07/03/2014    Past Surgical History:  Procedure Laterality Date  . NO PAST SURGERIES       OB History    Gravida  4   Para  3   Term  3   Preterm      AB  1   Living  3     SAB      TAB  0   Ectopic      Multiple      Live Births  3           Family History  Problem Relation Age of Onset  . Diabetes Maternal Aunt   . Healthy Mother   . Healthy Father   . Alcohol abuse Neg Hx   . Arthritis Neg Hx   . Asthma Neg Hx   . Birth defects Neg Hx   . Cancer Neg Hx   .  COPD Neg Hx   . Depression Neg Hx   . Drug abuse Neg Hx   . Early death Neg Hx   . Hearing loss Neg Hx   . Heart disease Neg Hx   . Hyperlipidemia Neg Hx   . Hypertension Neg Hx   . Kidney disease Neg Hx   . Learning disabilities Neg Hx   . Mental illness Neg Hx   . Mental retardation Neg Hx   . Miscarriages / Stillbirths Neg Hx   . Stroke Neg Hx   . Vision loss Neg Hx     Social History   Tobacco Use  . Smoking status: Current Every Day Smoker    Packs/day: 0.50    Types: Cigarettes  . Smokeless tobacco: Never Used  Substance Use Topics  . Alcohol use: Yes  . Drug use: Yes    Types: Marijuana    Comment: occasionally    Home Medications Prior to Admission medications   Medication Sig Start Date End Date Taking? Authorizing Provider  FLUoxetine (PROZAC) 20 MG capsule Take 40 mg by mouth daily.  09/13/19  Yes [provider]  Oxcarbazepine (TRILEPTAL) 300 MG tablet Take 300 mg by mouth 2 (two) times daily. 09/13/19  Yes [provider]  pantoprazole (PROTONIX) 40 MG tablet Take 40 mg by mouth daily. 08/08/19  Yes [provider]  QUEtiapine (SEROQUEL) 50 MG tablet Take 50 mg by mouth at bedtime. 09/13/19  Yes [provider]  ondansetron (ZOFRAN ODT) 4 MG disintegrating tablet Take 1 tablet (4 mg total) by mouth every 8 (eight) hours as needed for nausea. Patient not taking: Reported on 02/17/2020 11/26/19   Robinson, [place] N, PA-C  pantoprazole (PROTONIX) 20 MG tablet Take 1 tablet (20 mg total) by mouth daily. Patient not taking: Reported on 02/17/2020 04/23/19   Lacretia Leigh, MD    Allergies    Patient has no known allergies.  Review of Systems   Review of Systems  Constitutional: Negative for fever.  Respiratory: Negative for cough and shortness of breath.   Cardiovascular: Negative for chest pain.  Gastrointestinal: Positive for abdominal pain and nausea. Negative for constipation, diarrhea, hematemesis, hematochezia, melena and  vomiting.  Genitourinary: Negative for dysuria, hematuria, vaginal bleeding and vaginal discharge.  All other systems reviewed and are negative.   Physical Exam Updated Vital Signs BP 134/70   Pulse 75   Temp 98.8 F (37.1 C) (Oral)   Resp 20   LMP 02/09/2020   SpO2 100%   Physical Exam Vitals and nursing note reviewed.  Constitutional:      Appearance: She is well-developed. She is not toxic-appearing or diaphoretic.  HENT:     Head: Normocephalic and atraumatic.     Mouth/Throat:     Mouth: Mucous membranes are moist.     Pharynx: Oropharynx is clear.  Eyes:     Conjunctiva/sclera: Conjunctivae normal.  Cardiovascular:     Rate and Rhythm: Normal rate and regular rhythm.     Pulses: Normal pulses.     Heart sounds: No murmur.  Pulmonary:     Effort: Pulmonary effort is normal. No respiratory distress.     Breath sounds: Normal breath sounds.  Abdominal:     General: There is no distension.     Palpations: Abdomen is soft.     Tenderness: There is abdominal tenderness (mild, LUQ). There is no right CVA tenderness, left CVA tenderness, guarding or rebound.  Musculoskeletal:     Cervical back: Neck supple. No rigidity.  Skin:    General: Skin is warm and dry.     Capillary Refill: Capillary refill takes less than 2 seconds.  Neurological:     General: No focal deficit present.     Mental Status: She is alert and oriented to person, place, and time.  Psychiatric:        Mood and Affect: Mood normal.        Behavior: Behavior normal.     ED Results / Procedures / Treatments   Labs (all labs ordered are listed, but only abnormal results are displayed) Labs Reviewed  COMPREHENSIVE METABOLIC PANEL - Abnormal; Notable for the following components:      Result Value   Potassium 3.1 (*)    Glucose, Bld 151 (*)    Creatinine, Ser 1.11 (*)    Albumin 5.1 (*)    Total Bilirubin 1.8 (*)    All other components within normal limits  CBC - Abnormal; Notable for the  following components:   WBC 18.7 (*)    MCV 78.1 (*)    All other components within normal limits  URINALYSIS, ROUTINE W REFLEX MICROSCOPIC - Abnormal; Notable for the following components:   Hgb urine dipstick MODERATE (*)    Ketones, ur 20 (*)    Protein, ur 100 (*)    Bacteria, UA RARE (*)    All other components within normal limits  LIPASE, BLOOD  I-STAT BETA HCG BLOOD, ED ([facility], [facility], [facility] ONLY)    EKG EKG Interpretation  Date/Time:  Friday February 17 2020 15:43:56 EST Ventricular Rate:  69 PR Interval:    QRS Duration: 93 QT Interval:  416 QTC Calculation: 446 R Axis:   83 Text Interpretation: Sinus rhythm Nonspecific T abnrm, anterolateral leads Baseline wander No significant change since last tracing Abnormal ECG Confirmed by Gerhard Munch [phone]) on 02/17/2020 4:08:07 PM   Radiology No results found.  Procedures Procedures (including critical care time)  Medications Ordered in ED Medications  sodium chloride flush (NS) 0.9 % injection 3 mL (has no administration in time range)  ondansetron (ZOFRAN-ODT) 4 MG disintegrating tablet (has no administration in time range)  potassium chloride 10 mEq in 100 mL IVPB (10 mEq Intravenous New Bag/Given 02/17/20 1733)  ondansetron (ZOFRAN-ODT) disintegrating tablet 4 mg (4 mg Oral Given 02/17/20 1315)  lactated ringers bolus 1,000 mL (1,000 mLs Intravenous New Bag/Given 02/17/20 1620)  droperidol (INAPSINE) 2.5 MG/ML injection 2.5 mg (2.5 mg Intravenous Given 02/17/20 1617)    ED Course  I have reviewed the triage vital signs and the nursing notes.  Pertinent labs & imaging results that were available during my care of the patient were reviewed by me and considered in my medical decision making (see chart for details).    MDM Rules/Calculators/A&P                      Here for periumbilical and LUQ abd pain, N/V.  Recurrent symptoms that she reports having for several months.  She has had frequent visits for the same and  reports her symptoms are exactly similar to previous.    No pelvic symptoms/TTP, preg negative, do not suspect torsion, PID, TOA.    No RLQ pain, do not suspect appy.    Abd soft and only minimally tender, no imaging indicated at this time.    Pt has an appointment with PCP tomorrow and is getting referred to GI.    WBC elevation likely secondary to emesis and is downtrending from previous.  Mildly elevated renal markers and mild hypokalemia which is likely secondary to vomiting.    Symptoms resolved after treatment as above.  Strict return precautions given, discharged to follow up with PCP and GI in stable condition.     Final Clinical Impression(s) / ED Diagnoses Final diagnoses:  Non-intractable vomiting with nausea, unspecified vomiting type  Hypokalemia    Rx / DC Orders ED Discharge Orders    None       Daneshia Tavano, [place], MD 02/17/20 1738    Gerhard Munch, MD 02/20/20 2135

## 2020-02-18 LAB — NOVEL CORONAVIRUS, NAA (HOSP ORDER, SEND-OUT TO REF LAB; TAT 18-24 HRS): SARS-CoV-2, NAA: NOT DETECTED

## 2020-03-15 ENCOUNTER — Encounter: Payer: Self-pay | Admitting: Nurse Practitioner

## 2020-03-29 ENCOUNTER — Other Ambulatory Visit (INDEPENDENT_AMBULATORY_CARE_PROVIDER_SITE_OTHER): Payer: Medicaid Other

## 2020-03-29 ENCOUNTER — Encounter: Payer: Self-pay | Admitting: Nurse Practitioner

## 2020-03-29 ENCOUNTER — Ambulatory Visit: Payer: Medicaid Other | Admitting: Nurse Practitioner

## 2020-03-29 VITALS — BP 104/62 | HR 65 | Temp 98.2°F | Ht 63.0 in | Wt 134.0 lb

## 2020-03-29 DIAGNOSIS — D72829 Elevated white blood cell count, unspecified: Secondary | ICD-10-CM

## 2020-03-29 DIAGNOSIS — R112 Nausea with vomiting, unspecified: Secondary | ICD-10-CM | POA: Diagnosis not present

## 2020-03-29 DIAGNOSIS — K5900 Constipation, unspecified: Secondary | ICD-10-CM | POA: Diagnosis not present

## 2020-03-29 DIAGNOSIS — R1010 Upper abdominal pain, unspecified: Secondary | ICD-10-CM

## 2020-03-29 DIAGNOSIS — R6881 Early satiety: Secondary | ICD-10-CM

## 2020-03-29 DIAGNOSIS — Z01818 Encounter for other preprocedural examination: Secondary | ICD-10-CM

## 2020-03-29 LAB — IBC + FERRITIN
Ferritin: 25.6 ng/mL (ref 10.0–291.0)
Iron: 191 ug/dL — ABNORMAL HIGH (ref 42–145)
Saturation Ratios: 52.3 % — ABNORMAL HIGH (ref 20.0–50.0)
Transferrin: 261 mg/dL (ref 212.0–360.0)

## 2020-03-29 NOTE — Progress Notes (Signed)
ASSESSMENT / PLAN:   24 year old female with PMH significant for bipolar disorder, schizophrenia, GAD, tobacco abuse, THC use  #Chronic episodic nausea/vomiting and upper abdominal pain now with early satiety without weight loss --THC may be a factor.  I have asked her to stop smoking marijuana, at least for the time being while we are trying to sort things out --Functional etiology?  --CT scan x3 for these symptoms all unrevealing.  --We discussed upper endoscopy.  The yield is probably low but would be a reasonable next step in her evaluation . Patient will be scheduled for EGD. The risks and benefits of EGD were discussed and the patient agrees to proceed.  --She ran out of Protonix 3 months ago, never noticed any improvement with it anyway.  Therefore I do not see any need to resume it at this point --Continue Zofran as needed --If evaluation negative and symptoms persist consider trial of TCA unless it interferes with other medications  #Child bearing age. Trying to conceive.  --obtain urine pregnancy test prior since undergoing procedure  # Microcytosis without anemia --check ferritin, TIBC  #Constipation --Continue drinking 5 bottles of water daily --She has some bloating so I will hold off on starting fiber but will try daily MiraLAX  # Chronic leukocytosis --WBC ranging over the years between 13K and 30 K.  --Consider hematology consult, will defer this to PCP  HPI:     Chief Complaint: Abdominal pain  [name] Weber is a 24 year old female with PMH as listed above.  She is referred by PCP for evaluation of abdominal pain.  Patient also has chronic nausea and vomiting.  In the last 2 years she has been to the ED greater than 10 times for abdominal pain/and/or nausea vomiting.  She has had 3 CT scans of the abdomen and pelvis with contrast since Feb 2018- no acute findings on any of them.  Lipase repeatedly normal.  Liver test repeatedly normal except for  mild elevation in total bilirubin on an intermittent basis but never greater than 1.8.  She does have chronically elevated Hamza count  Patient describes intermittent episodes of nausea, vomiting and mid upper abdominal pain over the last couple of years.  In between episodes she would generally feel okay except has now recently developed early satiety.  She does think stress may bring on some of these intermittent episodes but is not the sole cause.  When the episodes occur she takes Zofran but if waits too late then cannot control the nausea and vomiting.  She is unable to eat when the episodes of nausea, vomiting and upper abdominal pain occur so generally has to be treated in the ED.  Pepto-Bismol is helpful at times.  Protonix listed on home medication list but patient says she ran out of it 3 months ago.  No noticeable improvement in symptoms with the PPI anyway.  She does take ibuprofen but no more than 3 doses a month.  Her weight fluctuates a few pounds but is overall stable.  Last episode of the symptoms was back in February, she was seen in the ED at that time.  In addition to above patient gives a 70-month history of constipation.  She has 2-3 bowel movements a week.  Generally first stool is solid/hard and this is followed by a couple of episodes of runny stool.  She feels bloated at times.  No blood in stool.  She drinks  4-5 bottles of water every day.  Patient and husband have been trying to conceive, he has apparently being seen at infertility clinic.  Patient has children of her own.  They do not use birth control at this time.  Data Reviewed:  Most recent labs 02/17/2020 in ED for nausea and vomiting.  WBC 18.7, hemoglobin 14.2, MCV 78, platelets 314 Potassium 3.1, glucose 151, creatinine 1.1, total bilirubin 1.8, albumin elevated at 5.1, AST 32, ALT 30 lipase 19   Past Medical History:  Diagnosis Date  . Chlamydia infection   . Trichomonas 01/2012  . UTI (lower urinary tract  infection)      Past Surgical History:  Procedure Laterality Date  . NO PAST SURGERIES     Family History  Problem Relation Age of Onset  . Diabetes Maternal Aunt   . Healthy Mother   . Healthy Father   . Alcohol abuse Neg Hx   . Arthritis Neg Hx   . Asthma Neg Hx   . Birth defects Neg Hx   . Cancer Neg Hx   . COPD Neg Hx   . Depression Neg Hx   . Drug abuse Neg Hx   . Early death Neg Hx   . Hearing loss Neg Hx   . Heart disease Neg Hx   . Hyperlipidemia Neg Hx   . Hypertension Neg Hx   . Kidney disease Neg Hx   . Learning disabilities Neg Hx   . Mental illness Neg Hx   . Mental retardation Neg Hx   . Miscarriages / Stillbirths Neg Hx   . Stroke Neg Hx   . Vision loss Neg Hx    Social History   Tobacco Use  . Smoking status: Current Every Day Smoker    Packs/day: 0.50    Types: Cigarettes  . Smokeless tobacco: Never Used  Substance Use Topics  . Alcohol use: Yes  . Drug use: Yes    Types: Marijuana    Comment: occasionally   Current Outpatient Medications  Medication Sig Dispense Refill  . FLUoxetine (PROZAC) 20 MG capsule Take 40 mg by mouth daily.     . Oxcarbazepine (TRILEPTAL) 300 MG tablet Take 300 mg by mouth 2 (two) times daily.    . QUEtiapine (SEROQUEL) 50 MG tablet Take 50 mg by mouth at bedtime.    . pantoprazole (PROTONIX) 40 MG tablet Take 40 mg by mouth daily.     No current facility-administered medications for this visit.   No Known Allergies   Review of Systems: Positive for anxiety, depression, menstrual pain, muscle pain and cramps, night.  All other systems reviewed and negative except where noted in HPI.   Creatinine clearance cannot be calculated (Patient's most recent lab result is older than the maximum 21 days allowed.)   Physical Exam:    Wt Readings from Last 3 Encounters:  03/29/20 134 lb (60.8 kg)  02/16/20 135 lb (61.2 kg)  11/28/19 125 lb (56.7 kg)    BP 104/62   Pulse 65   Temp 98.2 F (36.8 C)   Ht 5\' 3"   (1.6 m)   Wt 134 lb (60.8 kg)   BMI 23.74 kg/m  Constitutional:  Pleasant female in no acute distress. Psychiatric: Normal mood and affect. Behavior is normal. EENT: Pupils normal.  Conjunctivae are normal. No scleral icterus. Neck supple.  Cardiovascular: Normal rate, regular rhythm. No edema Pulmonary/chest: Effort normal and breath sounds normal. No wheezing, rales or rhonchi. Abdominal: Soft, nondistended, nontender. Bowel sounds  active throughout. There are no masses palpable. No hepatomegaly. Neurological: Alert and oriented to person place and time. Skin: Skin is warm and dry. No rashes noted.  Willette Cluster, NP  03/29/2020, 10:33 AM  Cc:  Referring Provider Mirna Mires, MD

## 2020-03-29 NOTE — Patient Instructions (Signed)
If you are age 24 or older, your body mass index should be between 23-30. Your Body mass index is 23.74 kg/m. If this is out of the aforementioned range listed, please consider follow up with your Primary Care Provider.  If you are age 31 or younger, your body mass index should be between 19-25. Your Body mass index is 23.74 kg/m. If this is out of the aformentioned range listed, please consider follow up with your Primary Care Provider.   You have been scheduled for an endoscopy. Please follow written instructions given to you at your visit today. If you use inhalers (even only as needed), please bring them with you on the day of your procedure. Your physician has requested that you go to www.startemmi.com and enter the access code given to you at your visit today. This web site gives a general overview about your procedure. However, you should still follow specific instructions given to you by our office regarding your preparation for the procedure.  Your provider has requested that you go to the basement level for lab work before leaving today. Press "B" on the elevator. The lab is located at the first door on the left as you exit the elevator.  Start Miralax daily.  Drink 64 ounces of water daily.  STOP THC.  Follow up with me after EGD.   Thank you for choosing me and [facility].   Willette Cluster, NP

## 2020-03-29 NOTE — Progress Notes (Signed)
Reviewed and agree with management plans. ? ?Sherril Shipman L. Lynessa Almanzar, MD, MPH  ?

## 2020-03-30 LAB — PREGNANCY, URINE: Preg Test, Ur: NEGATIVE

## 2020-04-03 ENCOUNTER — Ambulatory Visit (INDEPENDENT_AMBULATORY_CARE_PROVIDER_SITE_OTHER): Payer: Medicaid Other

## 2020-04-03 DIAGNOSIS — Z1159 Encounter for screening for other viral diseases: Secondary | ICD-10-CM

## 2020-04-03 LAB — SARS CORONAVIRUS 2 (TAT 6-24 HRS): SARS Coronavirus 2: NEGATIVE

## 2020-04-05 ENCOUNTER — Encounter: Payer: Self-pay | Admitting: Gastroenterology

## 2020-04-05 ENCOUNTER — Ambulatory Visit (AMBULATORY_SURGERY_CENTER): Payer: Medicaid Other | Admitting: Gastroenterology

## 2020-04-05 ENCOUNTER — Other Ambulatory Visit: Payer: Self-pay

## 2020-04-05 VITALS — BP 108/72 | HR 99 | Temp 97.1°F | Resp 13 | Ht 63.0 in | Wt 134.0 lb

## 2020-04-05 DIAGNOSIS — K3189 Other diseases of stomach and duodenum: Secondary | ICD-10-CM

## 2020-04-05 DIAGNOSIS — K449 Diaphragmatic hernia without obstruction or gangrene: Secondary | ICD-10-CM

## 2020-04-05 DIAGNOSIS — K2970 Gastritis, unspecified, without bleeding: Secondary | ICD-10-CM | POA: Diagnosis not present

## 2020-04-05 DIAGNOSIS — K2950 Unspecified chronic gastritis without bleeding: Secondary | ICD-10-CM

## 2020-04-05 DIAGNOSIS — K228 Other specified diseases of esophagus: Secondary | ICD-10-CM

## 2020-04-05 DIAGNOSIS — R1010 Upper abdominal pain, unspecified: Secondary | ICD-10-CM

## 2020-04-05 MED ORDER — SODIUM CHLORIDE 0.9 % IV SOLN: 500.0000 mL | Freq: Once | INTRAVENOUS | Status: DC

## 2020-04-05 NOTE — Progress Notes (Signed)
Pt's states no medical or surgical changes since previsit or office visit.  LC - temp Dt - vitals

## 2020-04-05 NOTE — Progress Notes (Signed)
Upon reviewing discharge instructions with the patient she verbalized to the RN that her episodes of N/V and stomach pain seem to occur mostly after a night of drinking. RN questioned patient if she had shared this information with Dr. Orvan Falconer and she stated she had not. RN encouraged patient to give that information to Dr. Orvan Falconer as it may help to better diagnose her symptoms. Patient verbalized understanding.

## 2020-04-05 NOTE — Progress Notes (Addendum)
Temp taken by LC VS taken by CW 

## 2020-04-05 NOTE — Op Note (Signed)
[facility] Patient Name: [name] Weber Procedure Date: 04/05/2020 3:18 PM MRN: [id number] Endoscopist: Thornton Park MD, MD Age: 23 Referring MD:  Date of Birth: 30-Jul-1996 Gender: Female Account #: [id number] Procedure:                Upper GI endoscopy Indications:              Upper abdominal pain, Nausea with vomiting Medicines:                Monitored Anesthesia Care Procedure:                Pre-Anesthesia Assessment:                           - Prior to the procedure, a History and Physical                            was performed, and patient medications and                            allergies were reviewed. The patient's tolerance of                            previous anesthesia was also reviewed. The risks                            and benefits of the procedure and the sedation                            options and risks were discussed with the patient.                            All questions were answered, and informed consent                            was obtained. Prior Anticoagulants: The patient has                            taken no previous anticoagulant or antiplatelet                            agents. ASA Grade Assessment: II - A patient with                            mild systemic disease. After reviewing the risks                            and benefits, the patient was deemed in                            satisfactory condition to undergo the procedure.                           After obtaining informed consent, the endoscope was  passed under direct vision. Throughout the                            procedure, the patient's blood pressure, pulse, and                            oxygen saturations were monitored continuously. The                            Endoscope was introduced through the mouth, and                            advanced to the third part of duodenum. The upper                            GI  endoscopy was accomplished without difficulty.                            The patient tolerated the procedure well. Scope In: Scope Out: Findings:                 The examined esophagus was normal. Biopsies were                            taken with a cold forceps for histology. Estimated                            blood loss was minimal.                           The entire examined stomach was normal. Biopsies                            were taken with a cold forceps for histology.                            Estimated blood loss was minimal.                           The examined duodenum was normal. Biopsies were                            taken with a cold forceps for histology. Estimated                            blood loss was minimal.                           Hill Grade 1 hiatal hernia. The cardia and gastric                            fundus were normal on retroflexion.                           The exam was otherwise  without abnormality. Complications:            No immediate complications. Estimated blood loss:                            Minimal. Estimated Blood Loss:     Estimated blood loss was minimal. Impression:               - Normal esophagus. Biopsied.                           - Normal stomach. Biopsied.                           - Small hiatal hernia.                           - Normal examined duodenum. Biopsied.                           - The examination was otherwise normal.                           - No obvious source for symptoms identified on this                            examination. Recommendation:           - Patient has a contact number available for                            emergencies. The signs and symptoms of potential                            delayed complications were discussed with the                            patient. Return to normal activities tomorrow.                            Written discharge instructions were provided to the                             patient.                           - Resume previous diet.                           - Continue present medications.                           - Await pathology results.                           - Follow-up in the office with Dr. Orvan Falconer or Gunnar Fusi  Guenther to review these results. Tressia Danas MD, MD 04/05/2020 3:48:05 PM This report has been signed electronically.

## 2020-04-05 NOTE — Patient Instructions (Signed)
Handout provided on Hiatal hernia.   YOU HAD AN ENDOSCOPIC PROCEDURE TODAY AT THE [facility]:   Refer to the procedure report that was given to you for any specific questions about what was found during the examination.  If the procedure report does not answer your questions, please call your gastroenterologist to clarify.  If you requested that your care partner not be given the details of your procedure findings, then the procedure report has been included in a sealed envelope for you to review at your convenience later.  YOU SHOULD EXPECT: Some feelings of bloating in the abdomen. Passage of more gas than usual.  Walking can help get rid of the air that was put into your GI tract during the procedure and reduce the bloating. If you had a lower endoscopy (such as a colonoscopy or flexible sigmoidoscopy) you may notice spotting of blood in your stool or on the toilet paper. If you underwent a bowel prep for your procedure, you may not have a normal bowel movement for a few days.  Please Note:  You might notice some irritation and congestion in your nose or some drainage.  This is from the oxygen used during your procedure.  There is no need for concern and it should clear up in a day or so.  SYMPTOMS TO REPORT IMMEDIATELY:    Following upper endoscopy (EGD)  Vomiting of blood or coffee ground material  New chest pain or pain under the shoulder blades  Painful or persistently difficult swallowing  New shortness of breath  Fever of 100F or higher  Black, tarry-looking stools  For urgent or emergent issues, a gastroenterologist can be reached at any hour by calling [phone]. Do not use MyChart messaging for urgent concerns.    DIET:  We do recommend a small meal at first, but then you may proceed to your regular diet.  Drink plenty of fluids but you should avoid alcoholic beverages for 24 hours.  ACTIVITY:  You should plan to take it easy for the rest of today and you  should NOT DRIVE or use heavy machinery until tomorrow (because of the sedation medicines used during the test).    FOLLOW UP: Our staff will call the number listed on your records 48-72 hours following your procedure to check on you and address any questions or concerns that you may have regarding the information given to you following your procedure. If we do not reach you, we will leave a message.  We will attempt to reach you two times.  During this call, we will ask if you have developed any symptoms of COVID 19. If you develop any symptoms (ie: fever, flu-like symptoms, shortness of breath, cough etc.) before then, please call [phone].  If you test positive for Covid 19 in the 2 weeks post procedure, please call and report this information to [place].    If any biopsies were taken you will be contacted by phone or by letter within the next 1-3 weeks.  Please call us at [phone] if you have not heard about the biopsies in 3 weeks.    SIGNATURES/CONFIDENTIALITY: You and/or your care partner have signed paperwork which will be entered into your electronic medical record.  These signatures attest to the fact that that the information above on your After Visit Summary has been reviewed and is understood.  Full responsibility of the confidentiality of this discharge information lies with you and/or your care-partner.

## 2020-04-05 NOTE — Progress Notes (Signed)
Called to room to assist during endoscopic procedure.  Patient ID and intended procedure confirmed with present staff. Received instructions for my participation in the procedure from the performing physician.  

## 2020-04-09 ENCOUNTER — Telehealth: Payer: Self-pay

## 2020-04-09 ENCOUNTER — Telehealth: Payer: Self-pay | Admitting: *Deleted

## 2020-04-09 NOTE — Telephone Encounter (Signed)
Attempted to reach patient for post-procedure f/u call. No answer. Left message for her to pease not hesitate to call us if she has any questions/concerns regarding her care.

## 2020-04-09 NOTE — Telephone Encounter (Signed)
  Follow up Call-  Call back number 04/05/2020  Post procedure Call Back phone  # [phone]  Permission to leave phone message Yes  Some recent data might be hidden     Patient questions:  Do you have a fever, pain , or abdominal swelling? No. Pain Score  0 *  Have you tolerated food without any problems? Yes.    Have you been able to return to your normal activities? Yes.    Do you have any questions about your discharge instructions: Diet   No. Medications  No. Follow up visit  No.  Do you have questions or concerns about your Care? No.  Actions: * If pain score is 4 or above: No action needed, pain <4  1. Have you developed a fever since your procedure? NO  2.   Have you had an respiratory symptoms (SOB or cough) since your procedure? NO  3.   Have you tested positive for COVID 19 since your procedure NO  4.   Have you had any family members/close contacts diagnosed with the COVID 19 since your procedure?  NO   If yes to any of these questions please route to Laverna Peace, RN and Charlett Lango, RN

## 2020-04-10 ENCOUNTER — Other Ambulatory Visit: Payer: Self-pay

## 2020-04-10 MED ORDER — ESOMEPRAZOLE MAGNESIUM 40 MG PO CPDR: 40.0000 mg | DELAYED_RELEASE_CAPSULE | Freq: Two times a day (BID) | ORAL | 3 refills | Status: DC

## 2020-05-10 ENCOUNTER — Ambulatory Visit: Payer: Medicaid Other | Admitting: Gastroenterology

## 2020-05-10 ENCOUNTER — Encounter: Payer: Self-pay | Admitting: Gastroenterology

## 2020-05-10 VITALS — BP 112/62 | HR 90 | Ht 63.0 in | Wt 136.0 lb

## 2020-05-10 DIAGNOSIS — R140 Abdominal distension (gaseous): Secondary | ICD-10-CM | POA: Diagnosis not present

## 2020-05-10 DIAGNOSIS — K2970 Gastritis, unspecified, without bleeding: Secondary | ICD-10-CM | POA: Diagnosis not present

## 2020-05-10 DIAGNOSIS — R1010 Upper abdominal pain, unspecified: Secondary | ICD-10-CM | POA: Diagnosis not present

## 2020-05-10 NOTE — Patient Instructions (Signed)
Continue to take your Nexium 40 mg twice daily for at least 8 more weeks.  We will work to get your breath test results from your primary care provider.   Please call me in June if you aren't feeling better.

## 2020-05-10 NOTE — Progress Notes (Signed)
Referring Provider: Duard Larsen, MD Primary Care Physician:  Duard Larsen, MD  Chief complaint:  Abdominal pain   IMPRESSION:  Postprandial epigastic pain with associated bloating and malodorous eructation    - normal liver enzymes and lipase    - normal CT abd/pelvis x 3    - duodenal biopsies normal, gastric biopsies with gastritis, essentially normal esophageal biopsies H pylori negative gastritis on EGD 04/05/20  Postprandial epigastic pain with associated bloating and malodorous eructation: Reviewed recent endoscopic and biopsy results. Symptoms may be due to gastritis. Must also consider SIBO, gastroparesis, impaired fundic accomodation, and functional dyspepsia.   PLAN: Complete at least 8 weeks of Nexium 40 mg BID Obtain breath test results from [address] [facility] clinic (patient reported this was recently performed) Lactulose breath test for bacterial overgrowth if no improvement in 8 weeks of Nexium Consider trial of FDGard in the future  Please see the "Patient Instructions" section for addition details about the plan.  HPI: [name] Weber is a 24 y.o. female who returns in scheduled follow-up. Initially seen in the office by Tye Savoy. Seen by me for EGD 04/05/20. She returns today in scheduled follow-up. The interval history is obtained through the patient and review of her electronic health record. She has bipolar disorder, schizophrenia, anxiety, tobacco use, and THC use.   She has chronic nausea and vomiting.  In the last 2 years she has been to the ED greater than 10 times for abdominal pain/and/or nausea vomiting.  She has had 3 CT scans of the abdomen and pelvis with contrast since Feb 2018- no acute findings on any of them.  Lipase repeatedly normal.  Liver test repeatedly normal except for mild elevation in total bilirubin on an intermittent basis but never greater than 1.8.  She does have chronically elevated Christensen count. Did not find pantoprazole helpful in the  best.   EGD 04/05/20:  - Normal esophagus. Biopsies showed reactive/regenerative changes. - Normal stomach. Biopsies showed mild reactive gastropathy and mild chronic gastritis. H pylori negative - Small hiatal hernia. - Normal examined duodenum. Biopsies were normal. - The examination was otherwise normal. - No obvious source for symptoms identified on this examination.  Started Nexium 40 mg BID after endoscopy. Has been two weeks without any significant changes in her symptoms.   Today her primarily complaint is intermittent, non-radiating, postprandial upper abdominal pain that occurs within minutes of eating with associated bloating, malodourour eructation and flatus. Triggered by tomato sauce and onions. No diarrhea or constipation.  Poor appetite. Weight stable.   Mom was concerned it might be an ulcer.   Labs from 03/29/20: iron 191, ferriting 25.6, transferrin 261, saturation 52.3 Review of CBCs over the last 2 years shows no anemia  No known family history of colon cancer or polyps. No family history of uterine/endometrial cancer, pancreatic cancer or gastric/stomach cancer.   Past Medical History:  Diagnosis Date  . Chlamydia infection   . Trichomonas 01/2012  . UTI (lower urinary tract infection)     Past Surgical History:  Procedure Laterality Date  . NO PAST SURGERIES      Current Outpatient Medications  Medication Sig Dispense Refill  . esomeprazole (NEXIUM) 40 MG capsule Take 1 capsule (40 mg total) by mouth 2 (two) times daily before a meal. 60 capsule 3  . FLUoxetine (PROZAC) 20 MG capsule Take 40 mg by mouth daily.     . Oxcarbazepine (TRILEPTAL) 300 MG tablet Take 300 mg by mouth 2 (two) times daily.    Marland Kitchen  QUEtiapine (SEROQUEL) 50 MG tablet Take 50 mg by mouth at bedtime.     No current facility-administered medications for this visit.    Allergies as of 05/10/2020  . (No Known Allergies)    Family History  Problem Relation Age of Onset  . Diabetes  Maternal Aunt   . Healthy Mother   . Healthy Father   . Alcohol abuse Neg Hx   . Arthritis Neg Hx   . Asthma Neg Hx   . Birth defects Neg Hx   . Cancer Neg Hx   . COPD Neg Hx   . Depression Neg Hx   . Drug abuse Neg Hx   . Early death Neg Hx   . Hearing loss Neg Hx   . Heart disease Neg Hx   . Hyperlipidemia Neg Hx   . Hypertension Neg Hx   . Kidney disease Neg Hx   . Learning disabilities Neg Hx   . Mental illness Neg Hx   . Mental retardation Neg Hx   . Miscarriages / Stillbirths Neg Hx   . Stroke Neg Hx   . Vision loss Neg Hx     Social History   Socioeconomic History  . Marital status: Single    Spouse name: Not on file  . Number of children: Not on file  . Years of education: Not on file  . Highest education level: Not on file  Occupational History  . Not on file  Tobacco Use  . Smoking status: Current Every Day Smoker    Packs/day: 0.50    Types: Cigarettes  . Smokeless tobacco: Never Used  Substance and Sexual Activity  . Alcohol use: Yes  . Drug use: Yes    Types: Marijuana    Comment: occasionally  . Sexual activity: Yes    Partners: Male    Birth control/protection: None  Other Topics Concern  . Not on file  Social History Narrative  . Not on file   Social Determinants of Health   Financial Resource Strain:   . Difficulty of Paying Living Expenses:   Food Insecurity:   . Worried About Programme researcher, broadcasting/film/video in the Last Year:   . Barista in the Last Year:   Transportation Needs:   . Freight forwarder (Medical):   Marland Kitchen Lack of Transportation (Non-Medical):   Physical Activity:   . Days of Exercise per Week:   . Minutes of Exercise per Session:   Stress:   . Feeling of Stress :   Social Connections:   . Frequency of Communication with Friends and Family:   . Frequency of Social Gatherings with Friends and Family:   . Attends Religious Services:   . Active Member of Clubs or Organizations:   . Attends Banker Meetings:    Marland Kitchen Marital Status:   Intimate Partner Violence:   . Fear of Current or Ex-Partner:   . Emotionally Abused:   Marland Kitchen Physically Abused:   . Sexually Abused:     Review of Systems: 12 system ROS is negative except as noted above.   Physical Exam: General:   Alert,  well-nourished, pleasant and cooperative in NAD Head:  Normocephalic and atraumatic. Eyes:  Sclera clear, no icterus.   Conjunctiva pink. Ears:  Normal auditory acuity. Nose:  No deformity, discharge,  or lesions. Mouth:  No deformity or lesions.   Neck:  Supple; no masses or thyromegaly. Lungs:  Clear throughout to auscultation.   No wheezes. Heart:  Regular  rate and rhythm; no murmurs. Abdomen:  Soft,nontender, nondistended, normal bowel sounds, no rebound or guarding. No hepatosplenomegaly.   Rectal:  Deferred  Msk:  Symmetrical. No boney deformities LAD: No inguinal or umbilical LAD Extremities:  No clubbing or edema. Neurologic:  Alert and  oriented x4;  grossly nonfocal Skin:  Intact without significant lesions or rashes. Psych:  Alert and cooperative. Normal mood and affect.    Damyn Weitzel L. Orvan Falconer, MD, MPH 05/10/2020, 11:04 AM

## 2020-05-17 ENCOUNTER — Ambulatory Visit: Payer: Medicaid Other | Admitting: Nurse Practitioner

## 2020-08-31 ENCOUNTER — Inpatient Hospital Stay (HOSPITAL_COMMUNITY)
Admission: AD | Admit: 2020-08-31 | Discharge: 2020-09-01 | Disposition: A | Discharge status: Home / Self Care | Payer: Medicaid Other | Attending: Obstetrics and Gynecology | Admitting: Obstetrics and Gynecology

## 2020-08-31 ENCOUNTER — Encounter (HOSPITAL_COMMUNITY): Payer: Self-pay | Admitting: *Deleted

## 2020-08-31 DIAGNOSIS — Z3A01 Less than 8 weeks gestation of pregnancy: Secondary | ICD-10-CM | POA: Insufficient documentation

## 2020-08-31 DIAGNOSIS — Z79899 Other long term (current) drug therapy: Secondary | ICD-10-CM | POA: Diagnosis not present

## 2020-08-31 DIAGNOSIS — R112 Nausea with vomiting, unspecified: Secondary | ICD-10-CM

## 2020-08-31 DIAGNOSIS — F17210 Nicotine dependence, cigarettes, uncomplicated: Secondary | ICD-10-CM | POA: Diagnosis not present

## 2020-08-31 DIAGNOSIS — O210 Mild hyperemesis gravidarum: Secondary | ICD-10-CM | POA: Insufficient documentation

## 2020-08-31 DIAGNOSIS — O99331 Smoking (tobacco) complicating pregnancy, first trimester: Secondary | ICD-10-CM | POA: Insufficient documentation

## 2020-08-31 DIAGNOSIS — O99321 Drug use complicating pregnancy, first trimester: Secondary | ICD-10-CM | POA: Diagnosis not present

## 2020-08-31 DIAGNOSIS — F1299 Cannabis use, unspecified with unspecified cannabis-induced disorder: Secondary | ICD-10-CM | POA: Insufficient documentation

## 2020-08-31 LAB — URINALYSIS, ROUTINE W REFLEX MICROSCOPIC
Bilirubin Urine: NEGATIVE
Glucose, UA: 50 mg/dL — AB
Ketones, ur: 80 mg/dL — AB
Leukocytes,Ua: NEGATIVE
Nitrite: NEGATIVE
Protein, ur: 100 mg/dL — AB
Specific Gravity, Urine: 1.029 (ref 1.005–1.030)
pH: 6 (ref 5.0–8.0)

## 2020-08-31 LAB — POCT PREGNANCY, URINE: Preg Test, Ur: POSITIVE — AB

## 2020-08-31 MED ORDER — FAMOTIDINE IN NACL 20-0.9 MG/50ML-% IV SOLN
20.0000 mg | Freq: Once | INTRAVENOUS | Status: AC
  Administered 2020-08-31: 20 mg via INTRAVENOUS
  Filled 2020-08-31: qty 50

## 2020-08-31 MED ORDER — HALOPERIDOL LACTATE 5 MG/ML IJ SOLN
5.0000 mg | Freq: Once | INTRAMUSCULAR | Status: AC
  Administered 2020-08-31: 5 mg via INTRAVENOUS
  Filled 2020-08-31: qty 1

## 2020-08-31 MED ORDER — LACTATED RINGERS IV BOLUS
1000.0000 mL | Freq: Once | INTRAVENOUS | Status: AC
  Administered 2020-08-31: 1000 mL via INTRAVENOUS

## 2020-08-31 MED ORDER — PROMETHAZINE HCL 25 MG/ML IJ SOLN
25.0000 mg | Freq: Once | INTRAMUSCULAR | Status: AC
  Administered 2020-08-31: 25 mg via INTRAVENOUS
  Filled 2020-08-31: qty 1

## 2020-08-31 MED ORDER — METOCLOPRAMIDE HCL 10 MG PO TABS
10.0000 mg | ORAL_TABLET | Freq: Once | ORAL | Status: AC
  Administered 2020-08-31: 10 mg via ORAL
  Filled 2020-08-31: qty 1

## 2020-08-31 MED ORDER — M.V.I. ADULT IV INJ
Freq: Once | INTRAVENOUS | Status: AC
  Filled 2020-08-31: qty 10

## 2020-08-31 NOTE — MAU Note (Signed)
PT SAYS HAS BEEN VOMITING SINCE 11AM. HAS ZOFRAN ODT  AT HOME-   TOOK AT 1 PM  - NO RELIEF . HPT - ALL POSITIVE.

## 2020-08-31 NOTE — MAU Provider Note (Signed)
History     CSN: [id number]  Arrival date and time: 08/31/20 1940   None     Chief Complaint  Patient presents with  . Emesis   [name] Weber is a 24 y.o. [id number] at [redacted]w[redacted]d by Definite LMP of Jul 28, 2020.  She presents today for Emesis.  She states her symptoms started about 11am this morning and is unresponsive to Zofran dosing at 12pm.  She states she has been vomiting all day and thinks it has been "more than ten."  Patient states she has not eaten today and had fish yesterday.  She states the vomiting is causing stomach aches.  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  Patient reports last MJ usage was earlier today to help with N/V symptoms.    OB History    Gravida  5   Para  3   Term  3   Preterm      AB  1   Living  3     SAB  1   TAB  0   Ectopic      Multiple      Live Births  3           Past Medical History:  Diagnosis Date  . Chlamydia infection   . Trichomonas 01/2012  . UTI (lower urinary tract infection)     Past Surgical History:  Procedure Laterality Date  . NO PAST SURGERIES      Family History  Problem Relation Age of Onset  . Diabetes Maternal Aunt   . Healthy Mother   . Healthy Father   . Alcohol abuse Neg Hx   . Arthritis Neg Hx   . Asthma Neg Hx   . Birth defects Neg Hx   . Cancer Neg Hx   . COPD Neg Hx   . Depression Neg Hx   . Drug abuse Neg Hx   . Early death Neg Hx   . Hearing loss Neg Hx   . Heart disease Neg Hx   . Hyperlipidemia Neg Hx   . Hypertension Neg Hx   . Kidney disease Neg Hx   . Learning disabilities Neg Hx   . Mental illness Neg Hx   . Mental retardation Neg Hx   . Miscarriages / Stillbirths Neg Hx   . Stroke Neg Hx   . Vision loss Neg Hx     Social History   Tobacco Use  . Smoking status: Current Every Day Smoker    Packs/day: 0.50    Types: Cigarettes  . Smokeless tobacco: Never Used  Vaping Use  . Vaping Use: Never used  Substance Use Topics  . Alcohol use: Not  Currently  . Drug use: Yes    Types: Marijuana    Comment: Last used 08/31/20    Allergies: No Known Allergies  Medications Prior to Admission  Medication Sig Dispense Refill Last Dose  . esomeprazole (NEXIUM) 40 MG capsule Take 1 capsule (40 mg total) by mouth 2 (two) times daily before a meal. 60 capsule 3 Past Week at Unknown time  . FLUoxetine (PROZAC) 20 MG capsule Take 40 mg by mouth daily.    Past Week at Unknown time  . Oxcarbazepine (TRILEPTAL) 300 MG tablet Take 300 mg by mouth 2 (two) times daily.   Past Week at Unknown time  . QUEtiapine (SEROQUEL) 50 MG tablet Take 50 mg by mouth at bedtime.   Past Week at Unknown time  Review of Systems  Respiratory: Negative for cough and shortness of breath.   Gastrointestinal: Positive for abdominal pain, nausea and vomiting.  Genitourinary: Negative for difficulty urinating, dysuria, vaginal bleeding and vaginal discharge.   Physical Exam   Blood pressure (!) 127/97, pulse (!) 115, temperature 98.4 F (36.9 C), temperature source Oral, resp. rate 20, height 5\' 3"  (1.6 m), weight 64.2 kg, last menstrual period 07/28/2020, unknown if currently breastfeeding.  Physical Exam Vitals and nursing note reviewed.  Constitutional:      General: She is in acute distress.     Appearance: Normal appearance.  HENT:     Head: Normocephalic and atraumatic.  Eyes:     Conjunctiva/sclera: Conjunctivae normal.  Cardiovascular:     Rate and Rhythm: Normal rate.  Pulmonary:     Effort: Pulmonary effort is normal. No respiratory distress.  Musculoskeletal:        General: Normal range of motion.     Cervical back: Normal range of motion.  Skin:    General: Skin is warm and dry.  Neurological:     Mental Status: She is alert and oriented to person, place, and time.  Psychiatric:        Mood and Affect: Mood normal.        Behavior: Behavior normal.        Thought Content: Thought content normal.     MAU Course  Procedures Results  for orders placed or performed during the hospital encounter of 08/31/20 (from the past 24 hour(s))  Urinalysis, Routine w reflex microscopic Urine, Clean Catch     Status: Abnormal   Collection Time: 08/31/20  8:04 PM  Result Value Ref Range   Color, Urine YELLOW YELLOW   APPearance HAZY (A) CLEAR   Specific Gravity, Urine 1.029 1.005 - 1.030   pH 6.0 5.0 - 8.0   Glucose, UA 50 (A) NEGATIVE mg/dL   Hgb urine dipstick SMALL (A) NEGATIVE   Bilirubin Urine NEGATIVE NEGATIVE   Ketones, ur 80 (A) NEGATIVE mg/dL   Protein, ur 10/31/20 (A) NEGATIVE mg/dL   Nitrite NEGATIVE NEGATIVE   Leukocytes,Ua NEGATIVE NEGATIVE   RBC / HPF 6-10 0 - 5 RBC/hpf   WBC, UA 0-5 0 - 5 WBC/hpf   Bacteria, UA FEW (A) NONE SEEN   Squamous Epithelial / LPF 6-10 0 - 5   Mucus PRESENT   Pregnancy, urine POC     Status: Abnormal   Collection Time: 08/31/20  8:04 PM  Result Value Ref Range   Preg Test, Ur POSITIVE (A) NEGATIVE    MDM Start IV LR Bolus f/b Banana Bag Antiemetic PPI Steroid Antipsychotic-Off Label Use Assessment and Plan  24 year old 30 at 5.0weeks Cannabinoid Hyperemesis   -Reviewed POC with patient. -Exam performed. -Will start IV and give LR bolus -Phenergan and Pepcid. -Informed that pepcid will help with chest and stomach pain associated with increased gastric acids. -Patient without questions or concerns. -Will monitor and reassess.   [id number] 08/31/2020, 8:53 PM   Reassessment (10:27 PM)  Patient reports symptoms remain the same. Will add on Reglan and Haldol 5mg  to regimen.  Reassessment (12:40 AM) -Provider to bedside. -Patient reports nausea continues and requests medication. -Informed that nausea and some vomiting is expected during pregnancy, but that it should not interfere greatly with daily activities. -Discussed how patient will continue to experience severe symptoms until complete cessation of MJ usage. -Reviewed administration of Zofran and  Decadron. -Encouraged to start [facility] in order  to have primary ob manage symptoms in outpatient setting.  -Patient agreeable with POC and verbalizes understanding of education and recommendations.  -Orders placed.   Reassessment (1:37 AM) -Nurse reports patient now declines Zofran and requests discharge. -Rx for phenergan and Reglan sent to pharmacy -Encouraged to call or return to MAU if symptoms worsen or with the onset of new symptoms. -Discharged to home in stable condition.  [name] MSN, CNM Advanced Practice Provider, [facility] [employer]

## 2020-09-01 MED ORDER — DEXAMETHASONE SODIUM PHOSPHATE 10 MG/ML IJ SOLN
10.0000 mg | Freq: Once | INTRAMUSCULAR | Status: AC
  Administered 2020-09-01: 10 mg via INTRAVENOUS
  Filled 2020-09-01: qty 1

## 2020-09-01 MED ORDER — METOCLOPRAMIDE HCL 10 MG PO TABS: 10.0000 mg | ORAL_TABLET | Freq: Four times a day (QID) | ORAL | 0 refills | Status: DC

## 2020-09-01 MED ORDER — PROMETHAZINE HCL 12.5 MG PO TABS: 12.5000 mg | ORAL_TABLET | Freq: Four times a day (QID) | ORAL | 0 refills | Status: DC | PRN

## 2020-09-01 MED ORDER — SODIUM CHLORIDE 0.9 % IV SOLN
8.0000 mg | Freq: Once | INTRAVENOUS | Status: DC
  Filled 2020-09-01: qty 4

## 2020-09-02 LAB — URINE CULTURE

## 2020-09-07 ENCOUNTER — Other Ambulatory Visit: Payer: Self-pay

## 2020-09-07 ENCOUNTER — Ambulatory Visit: Payer: Self-pay | Admitting: *Deleted

## 2020-09-07 ENCOUNTER — Inpatient Hospital Stay (HOSPITAL_COMMUNITY)
Admission: AD | Admit: 2020-09-07 | Discharge: 2020-09-07 | Disposition: A | Discharge status: Home / Self Care | Payer: Medicaid Other | Attending: Obstetrics & Gynecology | Admitting: Obstetrics & Gynecology

## 2020-09-07 ENCOUNTER — Encounter (HOSPITAL_COMMUNITY): Payer: Self-pay | Admitting: Obstetrics & Gynecology

## 2020-09-07 DIAGNOSIS — F17210 Nicotine dependence, cigarettes, uncomplicated: Secondary | ICD-10-CM | POA: Diagnosis not present

## 2020-09-07 DIAGNOSIS — O99321 Drug use complicating pregnancy, first trimester: Secondary | ICD-10-CM | POA: Insufficient documentation

## 2020-09-07 DIAGNOSIS — O219 Vomiting of pregnancy, unspecified: Secondary | ICD-10-CM | POA: Insufficient documentation

## 2020-09-07 DIAGNOSIS — Z79899 Other long term (current) drug therapy: Secondary | ICD-10-CM | POA: Insufficient documentation

## 2020-09-07 DIAGNOSIS — O99891 Other specified diseases and conditions complicating pregnancy: Secondary | ICD-10-CM | POA: Diagnosis not present

## 2020-09-07 DIAGNOSIS — O99331 Smoking (tobacco) complicating pregnancy, first trimester: Secondary | ICD-10-CM | POA: Insufficient documentation

## 2020-09-07 DIAGNOSIS — Z3A01 Less than 8 weeks gestation of pregnancy: Secondary | ICD-10-CM | POA: Insufficient documentation

## 2020-09-07 DIAGNOSIS — F1290 Cannabis use, unspecified, uncomplicated: Secondary | ICD-10-CM | POA: Insufficient documentation

## 2020-09-07 DIAGNOSIS — R197 Diarrhea, unspecified: Secondary | ICD-10-CM

## 2020-09-07 LAB — URINALYSIS, ROUTINE W REFLEX MICROSCOPIC
Bilirubin Urine: NEGATIVE
Glucose, UA: NEGATIVE mg/dL
Ketones, ur: 5 mg/dL — AB
Leukocytes,Ua: NEGATIVE
Nitrite: NEGATIVE
Protein, ur: NEGATIVE mg/dL
Specific Gravity, Urine: 1.004 — ABNORMAL LOW (ref 1.005–1.030)
pH: 8 (ref 5.0–8.0)

## 2020-09-07 MED ORDER — HALOPERIDOL LACTATE 5 MG/ML IJ SOLN
5.0000 mg | Freq: Four times a day (QID) | INTRAMUSCULAR | Status: DC | PRN
  Administered 2020-09-07: 5 mg via INTRAVENOUS
  Filled 2020-09-07 (×2): qty 1

## 2020-09-07 MED ORDER — LOPERAMIDE HCL 2 MG PO CAPS
2.0000 mg | ORAL_CAPSULE | ORAL | Status: DC | PRN
  Administered 2020-09-07: 4 mg via ORAL
  Filled 2020-09-07: qty 2

## 2020-09-07 MED ORDER — DICYCLOMINE HCL 10 MG/5ML PO SOLN
10.0000 mg | Freq: Once | ORAL | Status: AC
  Administered 2020-09-07: 10 mg via ORAL
  Filled 2020-09-07: qty 5

## 2020-09-07 MED ORDER — LACTATED RINGERS IV BOLUS
1000.0000 mL | Freq: Once | INTRAVENOUS | Status: AC
  Administered 2020-09-07: 1000 mL via INTRAVENOUS

## 2020-09-07 MED ORDER — LIDOCAINE VISCOUS HCL 2 % MT SOLN
15.0000 mL | Freq: Once | OROMUCOSAL | Status: AC
  Administered 2020-09-07: 15 mL via ORAL
  Filled 2020-09-07: qty 15

## 2020-09-07 MED ORDER — DEXAMETHASONE SODIUM PHOSPHATE 10 MG/ML IJ SOLN
10.0000 mg | Freq: Once | INTRAMUSCULAR | Status: AC
  Administered 2020-09-07: 10 mg via INTRAVENOUS
  Filled 2020-09-07: qty 1

## 2020-09-07 MED ORDER — ALUM & MAG HYDROXIDE-SIMETH 200-200-20 MG/5ML PO SUSP
30.0000 mL | Freq: Once | ORAL | Status: AC
  Administered 2020-09-07: 30 mL via ORAL
  Filled 2020-09-07: qty 30

## 2020-09-07 MED ORDER — SODIUM CHLORIDE 0.9 % IV SOLN
25.0000 mg | Freq: Once | INTRAVENOUS | Status: AC
  Administered 2020-09-07: 25 mg via INTRAVENOUS
  Filled 2020-09-07: qty 1

## 2020-09-07 MED ORDER — ONDANSETRON HCL 4 MG/2ML IJ SOLN
4.0000 mg | Freq: Once | INTRAMUSCULAR | Status: AC
  Administered 2020-09-07: 4 mg via INTRAVENOUS
  Filled 2020-09-07: qty 2

## 2020-09-07 NOTE — Telephone Encounter (Addendum)
Pt called with complaints of vomiting and diarrhea since this morning 0600; she has vomitted 10 times today, and 5 episodes of watery diarrhea; the pt was discharged from the hospital on 09/01/20; she can not keep her medication down; recommendations made per nurse triage protocol; she verbalized understanding.  Reason for Disposition . MODERATE-SEVERE abdominal pain (e.g., interferes with normal activities, awakens from sleep)  Answer Assessment - Initial Assessment Questions 1. VOMITING SEVERITY: "How many times have you vomited in the past 24 hours?"     - MILD:  1 - 2 times/day    - MODERATE: 3 - 5 times/day, decreased oral intake without significant weight loss or symptoms of dehydration    - SEVERE: 6 or more times/day, vomits everything or nearly everything, with significant weight loss, symptoms of dehydration      10 times since 0600 09/07/20 2. ONSET: "When did the vomiting begin?"      0600 09/07/20 3. FLUIDS: "What fluids or food have you vomited up today?" "Have you been able to keep any fluids down?"     Gatorade, sprite, water, crackers, nausea meds 4. ABDOMINAL PAIN: "Are your having any abdominal pain?" If yes : "How bad is it and what does it feel like?" (e.g., crampy, dull, intermittent, constant)    constant aching all over 5. DIARRHEA: "Is there any diarrhea?" If Yes, ask: "How many times today?"      5 times today, watery diarrhea 6. CONTACTS: "Is there anyone else in the family with the same symptoms?"      no 7. CAUSE: "What do you think is causing your vomiting?"  not sure; discharged from hospital 09/01/20 due to same symptoms 8. HYDRATION STATUS: "Any signs of dehydration?" (e.g., dry mouth [not only dry lips], too weak to stand) "When did you last urinate?"     Patient can not determine during to diarrhea 9. OTHER SYMPTOMS: "Do you have any other symptoms?" (e.g., fever, headache, vertigo, vomiting blood or coffee grounds, recent head injury)    no 10. PREGNANCY:  "Is there any chance you are pregnant?" "When was your last menstrual period?"       Yes, 5 weeks and 6 days  Answer Assessment - Initial Assessment Questions 1. VOMITING SEVERITY: "How many times have you vomited in the past 24 hours?"     - MILD:  1 - 2 times/day    - MODERATE: 3 - 5 times/day, decreased oral intake without significant weight loss or symptoms of dehydration    - SEVERE: 6 or more times/day, vomits everything or nearly everything, with significant weight loss, symptoms of dehydration      10 times since 0600 09/07/20 2. ONSET: "When did the vomiting begin?" 0600 09/07/20     3. FLUIDS: "What fluids or food have you vomited up today?" "Are you able to keep any liquids down?"      4. TREATMENT: "What have you been doing so far to treat this?"      Unable to keep nausea meds down 5. DEHYDRATION: "When was the last time you urinated?" "Are you feeling lightheaded?" "Weight loss?"      6. PREGNANCY: "How many weeks pregnant are you?" "How has the pregnancy been going?"     Patient is [redacted] weeks pregnant 7. EDD: "What date are you expecting to deliver?"      8. MEDICATIONS: "What medications are you taking?" (e.g., prenatal vitamins, iron)  9. OTHER SYMPTOMS: "Do you have any other symptoms?"   5  episodes of watery diarrhea 09/07/20 since 0600  Protocols used: PREGNANCY - ABDOMINAL PAIN LESS THAN [redacted] WEEKS EGA-A-AH, VOMITING-A-AH, PREGNANCY - MORNING SICKNESS (NAUSEA AND VOMITING OF PREGNANCY)-A-AH

## 2020-09-07 NOTE — Discharge Instructions (Signed)
Food Choices to Help Relieve Diarrhea, Adult When you have diarrhea, the foods you eat and your eating habits are very important. Choosing the right foods and drinks can help:  Relieve diarrhea.  Replace lost fluids and nutrients.  Prevent dehydration. What general guidelines should I follow?  Relieving diarrhea  Choose foods with less than 2 g or .07 oz. of fiber per serving.  Limit fats to less than 8 tsp (38 g or 1.34 oz.) a day.  Avoid the following: ? Foods and beverages sweetened with high-fructose corn syrup, honey, or sugar alcohols such as xylitol, sorbitol, and mannitol. ? Foods that contain a lot of fat or sugar. ? Fried, greasy, or spicy foods. ? High-fiber grains, breads, and cereals. ? Raw fruits and vegetables.  Eat foods that are rich in probiotics. These foods include dairy products such as yogurt and fermented milk products. They help increase healthy bacteria in the stomach and intestines (gastrointestinal tract, or GI tract).  If you have lactose intolerance, avoid dairy products. These may make your diarrhea worse.  Take medicine to help stop diarrhea (antidiarrheal medicine) only as told by your health care provider. Replacing nutrients  Eat small meals or snacks every 3-4 hours.  Eat bland foods, such as Jiron rice, toast, or baked potato, until your diarrhea starts to get better. Gradually reintroduce nutrient-rich foods as tolerated or as told by your health care provider. This includes: ? Well-cooked protein foods. ? Peeled, seeded, and soft-cooked fruits and vegetables. ? Low-fat dairy products.  Take vitamin and mineral supplements as told by your health care provider. Preventing dehydration  Start by sipping water or a special solution to prevent dehydration (oral rehydration solution, ORS). Urine that is clear or pale yellow means that you are getting enough fluid.  Try to drink at least 8-10 cups of fluid each day to help replace lost  fluids.  You may add other liquids in addition to water, such as clear juice or decaffeinated sports drinks, as tolerated or as told by your health care provider.  Avoid drinks with caffeine, such as coffee, tea, or soft drinks.  Avoid alcohol. What foods are recommended?     The items listed may not be a complete list. Talk with your health care provider about what dietary choices are best for you. Grains [place]y rice. [place], [place], or pita breads (fresh or toasted), including plain rolls, buns, or bagels. [place]oney pasta. Saltine, soda, or graham crackers. Pretzels. Low-fiber cereal. Cooked cereals made with water (such as cornmeal, farina, or cream cereals). Plain muffins. Matzo. Melba toast. Zwieback. Vegetables Potatoes (without the skin). Most well-cooked and canned vegetables without skins or seeds. Tender lettuce. Fruits Apple sauce. Fruits canned in juice. Cooked apricots, cherries, grapefruit, peaches, pears, or plums. Fresh bananas and cantaloupe. Meats and other protein foods Baked or boiled chicken. Eggs. Tofu. Fish. Seafood. Smooth nut butters. Ground or well-cooked tender beef, ham, veal, lamb, pork, or poultry. Dairy Plain yogurt, kefir, and unsweetened liquid yogurt. Lactose-free milk, buttermilk, skim milk, or soy milk. Low-fat or nonfat hard cheese. Beverages Water. Low-calorie sports drinks. Fruit juices without pulp. Strained tomato and vegetable juices. Decaffeinated teas. Sugar-free beverages not sweetened with sugar alcohols. Oral rehydration solutions, if approved by your health care provider. Seasoning and other foods Bouillon, broth, or soups made from recommended foods. What foods are not recommended? The items listed may not be a complete list. Talk with your health care provider about what dietary choices are best for you. Grains  Whole grain, whole wheat, bran, or rye breads, rolls, pastas, and crackers. Wild or brown rice. Whole grain or bran cereals. Barley.  Oats and oatmeal. Corn tortillas or taco shells. Granola. Popcorn. Vegetables Raw vegetables. Fried vegetables. Cabbage, broccoli, Brussels sprouts, artichokes, baked beans, beet greens, corn, kale, legumes, peas, sweet potatoes, and yams. Potato skins. Cooked spinach and cabbage. Fruits Dried fruit, including raisins and dates. Raw fruits. Stewed or dried prunes. Canned fruits with syrup. Meat and other protein foods Fried or fatty meats. Deli meats. Chunky nut butters. Nuts and seeds. Beans and lentils. Tomasa Blase. Hot dogs. Sausage. Dairy High-fat cheeses. Whole milk, chocolate milk, and beverages made with milk, such as milk shakes. Half-and-half. Cream. sour cream. Ice cream. Beverages Caffeinated beverages (such as coffee, tea, soda, or energy drinks). Alcoholic beverages. Fruit juices with pulp. Prune juice. Soft drinks sweetened with high-fructose corn syrup or sugar alcohols. High-calorie sports drinks. Fats and oils Butter. Cream sauces. Margarine. Salad oils. Plain salad dressings. Olives. Avocados. Mayonnaise. Sweets and desserts Sweet rolls, doughnuts, and sweet breads. Sugar-free desserts sweetened with sugar alcohols such as xylitol and sorbitol. Seasoning and other foods Honey. Hot sauce. Chili powder. Gravy. Cream-based or milk-based soups. Pancakes and waffles. Summary  When you have diarrhea, the foods you eat and your eating habits are very important.  Make sure you get at least 8-10 cups of fluid each day, or enough to keep your urine clear or pale yellow.  Eat bland foods and gradually reintroduce healthy, nutrient-rich foods as tolerated, or as told by your health care provider.  Avoid high-fiber, fried, greasy, or spicy foods. This information is not intended to replace advice given to you by your health care provider. Make sure you discuss any questions you have with your health care provider. Document Revised: 03/31/2019 Document Reviewed: 12/05/2016 Elsevier Patient  Education  2020 [employer]   Hyperemesis Gravidarum Hyperemesis gravidarum is a severe form of nausea and vomiting that happens during pregnancy. Hyperemesis is worse than morning sickness. It may cause you to have nausea or vomiting all day for many days. It may keep you from eating and drinking enough food and liquids, which can lead to dehydration, malnutrition, and weight loss. Hyperemesis usually occurs during the first half (the first 20 weeks) of pregnancy. It often goes away once a woman is in her second half of pregnancy. However, sometimes hyperemesis continues through an entire pregnancy. What are the causes? The cause of this condition is not known. It may be related to changes in chemicals (hormones) in the body during pregnancy, such as the high level of pregnancy hormone (human chorionic gonadotropin) or the increase in the female sex hormone (estrogen). What are the signs or symptoms? Symptoms of this condition include:  Nausea that does not go away.  Vomiting that does not allow you to keep any food down.  Weight loss.  Body fluid loss (dehydration).  Having no desire to eat, or not liking food that you have previously enjoyed. How is this diagnosed? This condition may be diagnosed based on:  A physical exam.  Your medical history.  Your symptoms.  Blood tests.  Urine tests. How is this treated? This condition is managed by controlling symptoms. This may include:  Following an eating plan. This can help lessen nausea and vomiting.  Taking prescription medicines. An eating plan and medicines are often used together to help control symptoms. If medicines do not help relieve nausea and vomiting, you may need to receive fluids through an  IV at the hospital. Follow these instructions at home: Eating and drinking   Avoid the following: ? Drinking fluids with meals. Try not to drink anything during the 30 minutes before and after your meals. ? Drinking more  than 1 cup of fluid at a time. ? Eating foods that trigger your symptoms. These may include spicy foods, coffee, high-fat foods, very sweet foods, and acidic foods. ? Skipping meals. Nausea can be more intense on an empty stomach. If you cannot tolerate food, do not force it. Try sucking on ice chips or other frozen items and make up for missed calories later. ? Lying down within 2 hours after eating. ? Being exposed to environmental triggers. These may include food smells, smoky rooms, closed spaces, rooms with strong smells, warm or humid places, overly loud and noisy rooms, and rooms with motion or flickering lights. Try eating meals in a well-ventilated area that is free of strong smells. ? Quick and sudden changes in your movement. ? Taking iron pills and multivitamins that contain iron. If you take prescription iron pills, do not stop taking them unless your health care provider approves. ? Preparing food. The smell of food can spoil your appetite or trigger nausea.  To help relieve your symptoms: ? Listen to your body. Everyone is different and has different preferences. Find what works best for you. ? Eat and drink slowly. ? Eat 5-6 small meals daily instead of 3 large meals. Eating small meals and snacks can help you avoid an empty stomach. ? In the morning, before getting out of bed, eat a couple of crackers to avoid moving around on an empty stomach. ? Try eating starchy foods as these are usually tolerated well. Examples include cereal, toast, bread, potatoes, pasta, rice, and pretzels. ? Include at least 1 serving of protein with your meals and snacks. Protein options include lean meats, poultry, seafood, beans, nuts, nut butters, eggs, cheese, and yogurt. ? Try eating a protein-rich snack before bed. Examples of a protein-rick snack include cheese and crackers or a peanut butter sandwich made with 1 slice of whole-wheat bread and 1 tsp (5 g) of peanut butter. ? Eat or suck on things  that have ginger in them. It may help relieve nausea. Add  tsp ground ginger to hot tea or choose ginger tea. ? Try drinking 100% fruit juice or an electrolyte drink. An electrolyte drink contains sodium, potassium, and chloride. ? Drink fluids that are cold, clear, and carbonated or sour. Examples include lemonade, ginger ale, lemon-lime soda, ice water, and sparkling water. ? Brush your teeth or use a mouth rinse after meals. ? Talk with your health care provider about starting a supplement of vitamin B6. General instructions  Take over-the-counter and prescription medicines only as told by your health care provider.  Follow instructions from your health care provider about eating or drinking restrictions.  Continue to take your prenatal vitamins as told by your health care provider. If you are having trouble taking your prenatal vitamins, talk with your health care provider about different options.  Keep all follow-up and pre-birth (prenatal) visits as told by your health care provider. This is important. Contact a health care provider if:  You have pain in your abdomen.  You have a severe headache.  You have vision problems.  You are losing weight.  You feel weak or dizzy. Get help right away if:  You cannot drink fluids without vomiting.  You vomit blood.  You have constant nausea  and vomiting.  You are very weak.  You faint.  You have a fever and your symptoms suddenly get worse. Summary  Hyperemesis gravidarum is a severe form of nausea and vomiting that happens during pregnancy.  Making some changes to your eating habits may help relieve nausea and vomiting.  This condition may be managed with medicine.  If medicines do not help relieve nausea and vomiting, you may need to receive fluids through an IV at the hospital. This information is not intended to replace advice given to you by your health care provider. Make sure you discuss any questions you have with  your health care provider. Document Revised: 12/28/2017 Document Reviewed: 08/06/2016 Elsevier Patient Education  2020 [employer]. Norovirus Infection  Norovirus infection causes inflammation in the stomach and intestines (gastroenteritis) and food poisoning. It is caused by exposure to a virus in a group of similar viruses called noroviruses. Norovirus spreads very easily from person to person (is very contagious). It often occurs in places where people are in close contact, such as schools, nursing homes, and cruise ships. You can get it from food, water, surfaces, or other people who have the virus (are contaminated). Norovirus is also found in the stool (feces) or vomit of infected people. You can spread the infection as soon as you feel sick, and you may continue to be contagious after you recover. What are the causes? This condition is caused by contact with norovirus. You can catch norovirus if you:  Eat or drink something that is contaminated with norovirus.  Touch surfaces or objects that are contaminated with norovirus and then put your hand in or by your mouth or nose.  Have direct contact with an infected person who has symptoms.  Share food, drink, or utensils with someone who is sick with norovirus. What are the signs or symptoms? Symptoms usually begin within 12 hours to 2 days after you become infected. Most norovirus symptoms affect the digestive system.Symptoms may include:  Nausea.  Vomiting.  Diarrhea.  Stomach cramps.  Fever.  Chills.  Headache.  Muscle aches.  Tiredness. How is this diagnosed? This condition may be diagnosed based on:  Your symptoms.  A physical exam.  A stool test. How is this treated? There is no specific treatment for norovirus. Most people get better without treatment in about 2 days. Young children, the elderly, and people who are already sick may take up to 6 days to recover. Follow these instructions at home: Eating and  drinking  Drink plenty of water to replace fluids that are lost through diarrhea and vomiting. This prevents dehydration. Drink enough fluid to keep your urine clear or pale yellow.  Drink clear fluids in small amounts as you are able. Clear fluids include water, ice chips, fruit juice with water added (diluted fruit juice), and low-calorie sports drinks. ? Avoid fluids that contain a lot of sugar or caffeine, such as energy drinks, sports drinks, and soda. ? Avoid alcohol.  If instructed by your health care provider, drink an oral rehydration solution (ORS). This is a drink that is sold at pharmacies and retail stores. An ORS contains minerals (electrolytes) that you can lose through diarrhea and vomiting.  Eat bland, easy-to-digest foods in small amounts as you are able. These foods include bananas, applesauce, rice, lean meats, toast, and crackers. ? Avoid spicy or fatty foods. General instructions   Rest at home while you recover.  Do not prepare food for others while you are infected. Wait at  least 3 days after you recover from the illness to do this.  Take over-the-counter and prescription medicines only as told by your health care provider.  Wash your hands frequently with soap and water. If soap and water are not available, use hand sanitizer.  Make sure that all people in your household wash their hands well and often.  Keep all follow-up visits as told by your health care provider. This is important. How is this prevented? To help prevent the spread of norovirus:  Stay at home if you are feeling sick. This will reduce the risk of spreading the virus to others.  Wash your hands often with soap and water for at least 20 seconds, especially after using the toilet or changing a diaper.  Wash fruits and vegetables thoroughly before peeling, preparing, or serving them.  Throw out any food that a sick person may have touched.  Disinfect contaminated surfaces immediately after  someone in the household has been sick. Use a bleach-based household cleaner.  Immediately remove and wash soiled clothes or sheets. Contact a health care provider if:  You have vomiting, diarrhea, or stomach pain that gets worse.  You have symptoms that do not go away after 2-6 days.  You have a fever.  You cannot drink without vomiting.  You feel light-headed or dizzy.  Your symptoms get worse. Get help right away if: You develop symptoms of dehydration that do not improve with fluid replacement, such as:  Excessive sleepiness.  Lack of tears.  Very little urine production.  Dry mouth.  Muscle cramps.  Weak pulse.  Confusion. Summary  Norovirus infection is common and often occurs in places where people are in close contact, such as schools, nursing homes, and cruise ships.  To help prevent the spread of this infection, wash hands with soap and water for at least 20 seconds before handling food or after having contact with stool or body fluids.  There is no specific treatment for norovirus, but most people get better without treatment in about 2 days. People who are healthy when infected often recover sooner than those who are elderly, young, or already sick.  Replace lost fluids by drinking plenty of water, or by drinking oral rehydration solution (ORS), which contains important minerals called electrolytes. This prevents dehydration. This information is not intended to replace advice given to you by your health care provider. Make sure you discuss any questions you have with your health care provider. Document Revised: 04/01/2019 Document Reviewed: 01/14/2017 Elsevier Patient Education  2020 [employer]

## 2020-09-07 NOTE — [facility] Note (Signed)
Presents with c/o N/V, can't keep anything down.  States has vomited @ least 10x today.  Also c/o several episodes of diarrhea.  States medications prescribed for N/V "came back up".

## 2020-09-07 NOTE — MAU Provider Note (Signed)
Chief Complaint: Nausea and Emesis   First Provider Initiated Contact with Patient 09/07/20 1649     SUBJECTIVE HPI: [name] Weber is a 24 y.o. [id number] at 4614w6d who presents to Maternity Admissions reporting diarrhea that began overnight and triggered nausea/vomiting. She is not generally nauseated, only when she tries to eat or drink anything. Up until her diarrhea began, her N/V meds were working. She is also complaining of abdominal soreness from the vomiting.   Past Medical History:  Diagnosis Date  . Chlamydia infection   . Trichomonas 01/2012  . UTI (lower urinary tract infection)    OB History  Gravida Para Term Preterm AB Living  5 3 3   1 3   SAB TAB Ectopic Multiple Live Births  1 0     3    # Outcome Date GA Lbr Len/2nd Weight Sex Delivery Anes PTL Lv  5 Current           4 Term 10/06/14 4031w5d 12:35 / 00:10 5 lb 2.2 oz (2.33 kg) M Vag-Spont EPI  LIV  3 Term 08/20/13 5819w6d 01:37 / 00:12 5 lb 13.8 oz (2.66 kg) F Vag-Spont None  LIV  2 SAB 09/2012    U         Birth Comments: Positive pregnancy test October 2013, then next conception in December  1 Term 2012    F Vag-Spont   LIV   Past Surgical History:  Procedure Laterality Date  . NO PAST SURGERIES     Social History   Socioeconomic History  . Marital status: Single    Spouse name: Not on file  . Number of children: Not on file  . Years of education: Not on file  . Highest education level: Not on file  Occupational History  . Not on file  Tobacco Use  . Smoking status: Current Every Day Smoker    Packs/day: 0.50    Types: Cigarettes  . Smokeless tobacco: Never Used  Vaping Use  . Vaping Use: Never used  Substance and Sexual Activity  . Alcohol use: Not Currently  . Drug use: Yes    Types: Marijuana    Comment: Last used 08/31/20  . Sexual activity: Yes    Partners: Male    Birth control/protection: None  Other Topics Concern  . Not on file  Social History Narrative  . Not on file   Social  Determinants of Health   Financial Resource Strain:   . Difficulty of Paying Living Expenses: Not on file  Food Insecurity:   . Worried About Programme researcher, broadcasting/film/videounning Out of Food in the Last Year: Not on file  . Ran Out of Food in the Last Year: Not on file  Transportation Needs:   . Lack of Transportation (Medical): Not on file  . Lack of Transportation (Non-Medical): Not on file  Physical Activity:   . Days of Exercise per Week: Not on file  . Minutes of Exercise per Session: Not on file  Stress:   . Feeling of Stress : Not on file  Social Connections:   . Frequency of Communication with Friends and Family: Not on file  . Frequency of Social Gatherings with Friends and Family: Not on file  . Attends Religious Services: Not on file  . Active Member of Clubs or Organizations: Not on file  . Attends BankerClub or Organization Meetings: Not on file  . Marital Status: Not on file  Intimate Partner Violence:   . Fear of Current or Ex-Partner: Not  on file  . Emotionally Abused: Not on file  . Physically Abused: Not on file  . Sexually Abused: Not on file   Family History  Problem Relation Age of Onset  . Diabetes Maternal Aunt   . Healthy Mother   . Healthy Father   . Alcohol abuse Neg Hx   . Arthritis Neg Hx   . Asthma Neg Hx   . Birth defects Neg Hx   . Cancer Neg Hx   . COPD Neg Hx   . Depression Neg Hx   . Drug abuse Neg Hx   . Early death Neg Hx   . Hearing loss Neg Hx   . Heart disease Neg Hx   . Hyperlipidemia Neg Hx   . Hypertension Neg Hx   . Kidney disease Neg Hx   . Learning disabilities Neg Hx   . Mental illness Neg Hx   . Mental retardation Neg Hx   . Miscarriages / Stillbirths Neg Hx   . Stroke Neg Hx   . Vision loss Neg Hx    No current facility-administered medications on file prior to encounter.   Current Outpatient Medications on File Prior to Encounter  Medication Sig Dispense Refill  . metoCLOPramide (REGLAN) 10 MG tablet Take 1 tablet (10 mg total) by mouth every 6  (six) hours. 30 tablet 0  . Prenatal Vit-Fe Fumarate-FA (PRENATAL MULTIVITAMIN) TABS tablet Take 1 tablet by mouth daily at 12 noon.    . promethazine (PHENERGAN) 12.5 MG tablet Take 1 tablet (12.5 mg total) by mouth every 6 (six) hours as needed for nausea or vomiting. 30 tablet 0  . esomeprazole (NEXIUM) 40 MG capsule Take 1 capsule (40 mg total) by mouth 2 (two) times daily before a meal. 60 capsule 3  . FLUoxetine (PROZAC) 20 MG capsule Take 40 mg by mouth daily.     . Oxcarbazepine (TRILEPTAL) 300 MG tablet Take 300 mg by mouth 2 (two) times daily.    . QUEtiapine (SEROQUEL) 50 MG tablet Take 50 mg by mouth at bedtime.     No Known Allergies  I have reviewed patient's Past Medical Hx, Surgical Hx, Family Hx, Social Hx, medications and allergies.   Review of Systems  Gastrointestinal: Positive for abdominal pain, diarrhea, nausea and vomiting.  All other systems reviewed and are negative.   OBJECTIVE Patient Vitals for the past 24 hrs:  BP Temp Temp src Pulse Resp SpO2 Height Weight  09/07/20 1931 138/86 -- -- (!) 106 -- -- -- --  09/07/20 1558 121/79 98.9 F (37.2 C) Oral 93 18 100 % -- --  09/07/20 1553 -- -- -- -- -- -- 5\' 3"  (1.6 m) 143 lb 8 oz (65.1 kg)   Constitutional: Well-developed, well-nourished female in no acute distress.  Cardiovascular: normal rate & rhythm, no murmur Respiratory: normal rate and effort. Lung sounds clear throughout GI: Abd soft, non-tender, Pos BS x 4. No guarding or rebound tenderness MS: Extremities nontender, no edema, normal ROM Neurologic: Alert and oriented x 4.  SPECULUM EXAM: deferred  LAB RESULTS Results for orders placed or performed during the hospital encounter of 09/07/20 (from the past 24 hour(s))  Urinalysis, Routine w reflex microscopic Urine, Clean Catch     Status: Abnormal   Collection Time: 09/07/20  4:00 PM  Result Value Ref Range   Color, Urine YELLOW YELLOW   APPearance CLEAR CLEAR   Specific Gravity, Urine 1.004 (L)  1.005 - 1.030   pH 8.0 5.0 - 8.0  Glucose, UA NEGATIVE NEGATIVE mg/dL   Hgb urine dipstick SMALL (A) NEGATIVE   Bilirubin Urine NEGATIVE NEGATIVE   Ketones, ur 5 (A) NEGATIVE mg/dL   Protein, ur NEGATIVE NEGATIVE mg/dL   Nitrite NEGATIVE NEGATIVE   Leukocytes,Ua NEGATIVE NEGATIVE   RBC / HPF 0-5 0 - 5 RBC/hpf   WBC, UA 0-5 0 - 5 WBC/hpf   Bacteria, UA RARE (A) NONE SEEN   Squamous Epithelial / LPF 6-10 0 - 5    MAU COURSE & MDM Orders Placed This Encounter  Procedures  . Urinalysis, Routine w reflex microscopic Urine, Clean Catch  . Discharge patient   Meds ordered this encounter  Medications  . promethazine (PHENERGAN) 25 mg in sodium chloride 0.9 % 1,000 mL infusion  . loperamide (IMODIUM) capsule 2-4 mg  . ondansetron (ZOFRAN) injection 4 mg  . lactated ringers bolus 1,000 mL  . AND Linked Order Group   . alum & mag hydroxide-simeth (MAALOX/MYLANTA) 200-200-20 MG/5ML suspension 30 mL   . lidocaine (XYLOCAINE) 2 % viscous mouth solution 15 mL  . dicyclomine (BENTYL) 10 MG/5ML solution 10 mg  . haloperidol lactate (HALDOL) injection 5 mg  . dexamethasone (DECADRON) injection 10 mg   1650: Phenergan given first, will given imodium once nausea resolves. 1730: Pt vomited prior to phenergan bag and had another bowel movement, Imodium given. 1830: No additional bowel movement, pt still complaining of nausea. Zofran IVPB added + another bag of LR 1930: Pt now stating her upper abdominal pain is worse, GI cocktail ordered 2030: Abdominal pain reduced significantly, pt still nauseated, tired, and belching. Waiting on Bentyl from pharmacy.  2140: Bentyl given and patient threw up again, this time with scant amount of red blood. Advised this can happen with excessive vomiting. Decadron and Haldol added to regimen. 2215: Pt expressed relief and asked to go home. Advised that this is likely triggered by a virus vs her usual cyclical vomiting and encouraged her to begin her hyperemesis  regimen once she can keep meds/liquids/food down.  ASSESSMENT 1. Nausea and vomiting in pregnancy   2. Diarrhea of presumed infectious origin    PLAN Discharge home in stable condition with return precautions.    Follow-up Information    [facility]. Call.   Specialty: Obstetrics and Gynecology Why: to establish routine OB care Contact information: [address] [place] [address] [phone]             Allergies as of 09/07/2020   No Known Allergies     Medication List    TAKE these medications   esomeprazole 40 MG capsule Commonly known as: NexIUM Take 1 capsule (40 mg total) by mouth 2 (two) times daily before a meal.   FLUoxetine 20 MG capsule Commonly known as: PROZAC Take 40 mg by mouth daily.   metoCLOPramide 10 MG tablet Commonly known as: REGLAN Take 1 tablet (10 mg total) by mouth every 6 (six) hours.   Oxcarbazepine 300 MG tablet Commonly known as: TRILEPTAL Take 300 mg by mouth 2 (two) times daily.   prenatal multivitamin Tabs tablet Take 1 tablet by mouth daily at 12 noon.   promethazine 12.5 MG tablet Commonly known as: PHENERGAN Take 1 tablet (12.5 mg total) by mouth every 6 (six) hours as needed for nausea or vomiting.   QUEtiapine 50 MG tablet Commonly known as: SEROQUEL Take 50 mg by mouth at bedtime.       Edd Arbour, CNM, MSN,  IBCLC 09/07/20 10:50 PM

## 2020-09-16 ENCOUNTER — Other Ambulatory Visit: Payer: Self-pay

## 2020-09-16 ENCOUNTER — Inpatient Hospital Stay (HOSPITAL_COMMUNITY)
Admission: AD | Admit: 2020-09-16 | Discharge: 2020-09-16 | Disposition: A | Discharge status: Home / Self Care | Payer: Medicaid Other | Attending: Family Medicine | Admitting: Family Medicine

## 2020-09-16 ENCOUNTER — Encounter (HOSPITAL_COMMUNITY): Payer: Self-pay | Admitting: Family Medicine

## 2020-09-16 DIAGNOSIS — Y92009 Unspecified place in unspecified non-institutional (private) residence as the place of occurrence of the external cause: Secondary | ICD-10-CM | POA: Insufficient documentation

## 2020-09-16 DIAGNOSIS — S0012XA Contusion of left eyelid and periocular area, initial encounter: Secondary | ICD-10-CM | POA: Insufficient documentation

## 2020-09-16 DIAGNOSIS — F17210 Nicotine dependence, cigarettes, uncomplicated: Secondary | ICD-10-CM | POA: Diagnosis not present

## 2020-09-16 DIAGNOSIS — O99331 Smoking (tobacco) complicating pregnancy, first trimester: Secondary | ICD-10-CM | POA: Diagnosis not present

## 2020-09-16 DIAGNOSIS — F1290 Cannabis use, unspecified, uncomplicated: Secondary | ICD-10-CM | POA: Diagnosis not present

## 2020-09-16 DIAGNOSIS — Z79899 Other long term (current) drug therapy: Secondary | ICD-10-CM | POA: Diagnosis not present

## 2020-09-16 DIAGNOSIS — O99321 Drug use complicating pregnancy, first trimester: Secondary | ICD-10-CM | POA: Insufficient documentation

## 2020-09-16 DIAGNOSIS — W01190A Fall on same level from slipping, tripping and stumbling with subsequent striking against furniture, initial encounter: Secondary | ICD-10-CM | POA: Insufficient documentation

## 2020-09-16 DIAGNOSIS — R109 Unspecified abdominal pain: Secondary | ICD-10-CM | POA: Diagnosis present

## 2020-09-16 DIAGNOSIS — R112 Nausea with vomiting, unspecified: Secondary | ICD-10-CM | POA: Insufficient documentation

## 2020-09-16 DIAGNOSIS — Z3A01 Less than 8 weeks gestation of pregnancy: Secondary | ICD-10-CM | POA: Diagnosis not present

## 2020-09-16 DIAGNOSIS — O26891 Other specified pregnancy related conditions, first trimester: Secondary | ICD-10-CM | POA: Insufficient documentation

## 2020-09-16 DIAGNOSIS — F12188 Cannabis abuse with other cannabis-induced disorder: Secondary | ICD-10-CM

## 2020-09-16 LAB — URINALYSIS, ROUTINE W REFLEX MICROSCOPIC
Bilirubin Urine: NEGATIVE
Glucose, UA: NEGATIVE mg/dL
Hgb urine dipstick: NEGATIVE
Ketones, ur: 5 mg/dL — AB
Leukocytes,Ua: NEGATIVE
Nitrite: NEGATIVE
Protein, ur: 100 mg/dL — AB
Renal Epithelial: 3
Specific Gravity, Urine: 1.016 (ref 1.005–1.030)
pH: 7 (ref 5.0–8.0)

## 2020-09-16 LAB — RAPID URINE DRUG SCREEN, HOSP PERFORMED
Amphetamines: NOT DETECTED
Barbiturates: NOT DETECTED
Benzodiazepines: NOT DETECTED
Cocaine: NOT DETECTED
Opiates: NOT DETECTED
Tetrahydrocannabinol: POSITIVE — AB

## 2020-09-16 MED ORDER — M.V.I. ADULT IV INJ
Freq: Once | INTRAVENOUS | Status: AC
  Filled 2020-09-16: qty 10

## 2020-09-16 MED ORDER — HALOPERIDOL LACTATE 5 MG/ML IJ SOLN
5.0000 mg | Freq: Once | INTRAMUSCULAR | Status: AC
  Administered 2020-09-16: 5 mg via INTRAVENOUS
  Filled 2020-09-16: qty 1

## 2020-09-16 MED ORDER — PROMETHAZINE HCL 25 MG RE SUPP: 25.0000 mg | Freq: Four times a day (QID) | RECTAL | 3 refills | Status: DC | PRN

## 2020-09-16 MED ORDER — LACTATED RINGERS IV BOLUS
1000.0000 mL | Freq: Once | INTRAVENOUS | Status: AC
  Administered 2020-09-16: 1000 mL via INTRAVENOUS

## 2020-09-16 MED ORDER — FAMOTIDINE IN NACL 20-0.9 MG/50ML-% IV SOLN
20.0000 mg | Freq: Once | INTRAVENOUS | Status: AC
  Administered 2020-09-16: 20 mg via INTRAVENOUS
  Filled 2020-09-16: qty 50

## 2020-09-16 MED ORDER — DEXAMETHASONE SODIUM PHOSPHATE 10 MG/ML IJ SOLN
10.0000 mg | Freq: Once | INTRAMUSCULAR | Status: AC
  Administered 2020-09-16: 10 mg via INTRAVENOUS
  Filled 2020-09-16: qty 1

## 2020-09-16 NOTE — MAU Note (Signed)
[name] Weber is a 24 y.o. at [redacted]w[redacted]d here in MAU reporting: has been vomiting and having abdominal pain for 3 days. States she cannot keep anything down. Emesis > 10 in the past 24 hours. States last marijuana use was 2-3 weeks ago. No bleeding or discharge.  Onset of complaint: ongoing  Pain score: 10/10  Vitals:   09/16/20 1116  BP: 137/89  Pulse: 93  Resp: 20  Temp: 98 F (36.7 C)  SpO2: 99%     Lab orders placed from triage: UA

## 2020-09-16 NOTE — MAU Provider Note (Signed)
History     CSN: [id number]  Arrival date and time: 09/16/20 1059   First Provider Initiated Contact with Patient 09/16/20 1150      Chief Complaint  Patient presents with  . Abdominal Pain  . Nausea  . Emesis   [name] Weber is a 24 y.o. G5P3 at [redacted]w[redacted]d by LMP who presents to MAU with complaints of abdominal pain and emesis. Patient reports she has been unable to keep anything down for the past couple of days. She reports emesis 10+ times over the past 24 hours. Patient reports that abdominal pain makes emesis occur and makes it worse. She describes abdominal pain as aching specific to periumbilical area. Rates pain 8/10. Patient reports that hot showers and hot compresses help with symptoms. She reports last use of marijuana 2-3 weeks ago. Patient reports being followed in the gastro office for a hernia that was diagnosed on endoscopy. She denies any lower abdominal pain, vaginal bleeding or discharge.   Subsequently patient has a bruise on her left eye, patient reports that she fell at home and hit the table. Patient reports feeling safe at home.    OB History    Gravida  5   Para  3   Term  3   Preterm      AB  1   Living  3     SAB  1   TAB  0   Ectopic      Multiple      Live Births  3           Past Medical History:  Diagnosis Date  . Chlamydia infection   . Trichomonas 01/2012  . UTI (lower urinary tract infection)     Past Surgical History:  Procedure Laterality Date  . NO PAST SURGERIES      Family History  Problem Relation Age of Onset  . Diabetes Maternal Aunt   . Healthy Mother   . Healthy Father   . Alcohol abuse Neg Hx   . Arthritis Neg Hx   . Asthma Neg Hx   . Birth defects Neg Hx   . Cancer Neg Hx   . COPD Neg Hx   . Depression Neg Hx   . Drug abuse Neg Hx   . Early death Neg Hx   . Hearing loss Neg Hx   . Heart disease Neg Hx   . Hyperlipidemia Neg Hx   . Hypertension Neg Hx   . Kidney disease Neg Hx   . Learning  disabilities Neg Hx   . Mental illness Neg Hx   . Mental retardation Neg Hx   . Miscarriages / Stillbirths Neg Hx   . Stroke Neg Hx   . Vision loss Neg Hx     Social History   Tobacco Use  . Smoking status: Current Every Day Smoker    Packs/day: 0.50    Types: Cigarettes  . Smokeless tobacco: Never Used  Vaping Use  . Vaping Use: Never used  Substance Use Topics  . Alcohol use: Not Currently  . Drug use: Yes    Types: Marijuana    Comment: Last used 08/31/20    Allergies: No Known Allergies  Medications Prior to Admission  Medication Sig Dispense Refill Last Dose  . esomeprazole (NEXIUM) 40 MG capsule Take 1 capsule (40 mg total) by mouth 2 (two) times daily before a meal. 60 capsule 3   . FLUoxetine (PROZAC) 20 MG capsule Take 40 mg by mouth daily.      Marland Kitchen  hydrocortisone 1 % ointment Apply topically 2 (two) times daily.     . metoCLOPramide (REGLAN) 10 MG tablet Take 1 tablet (10 mg total) by mouth every 6 (six) hours. 30 tablet 0   . Oxcarbazepine (TRILEPTAL) 300 MG tablet Take 300 mg by mouth 2 (two) times daily.     . Prenatal Vit-Fe Fumarate-FA (PRENATAL MULTIVITAMIN) TABS tablet Take 1 tablet by mouth daily at 12 noon.     . promethazine (PHENERGAN) 12.5 MG tablet Take 1 tablet (12.5 mg total) by mouth every 6 (six) hours as needed for nausea or vomiting. 30 tablet 0   . QUEtiapine (SEROQUEL) 50 MG tablet Take 50 mg by mouth at bedtime.       Review of Systems  HENT:       Bruising around left eye  Respiratory: Negative.   Cardiovascular: Negative.   Gastrointestinal: Positive for abdominal pain, nausea and vomiting. Negative for constipation and diarrhea.  Genitourinary: Negative.   Musculoskeletal: Negative.   Neurological: Negative.   Psychiatric/Behavioral: Negative.    Physical Exam   Blood pressure 137/89, pulse 93, temperature 98 F (36.7 C), temperature source Oral, resp. rate 20, height 5\' 3"  (1.6 m), weight 64.3 kg, last menstrual period 07/28/2020,  SpO2 99 %, unknown if currently breastfeeding.  Physical Exam Vitals and nursing note reviewed.  Constitutional:      Appearance: She is well-developed.  HENT:     Head: Normocephalic.  Cardiovascular:     Rate and Rhythm: Normal rate and regular rhythm.  Pulmonary:     Effort: Pulmonary effort is normal. No respiratory distress.     Breath sounds: Normal breath sounds. No wheezing.  Abdominal:     Palpations: Abdomen is soft. There is no mass.     Tenderness: There is abdominal tenderness in the epigastric area and periumbilical area. There is guarding. There is no right CVA tenderness or left CVA tenderness.  Musculoskeletal:     Right lower leg: No edema.     Left lower leg: No edema.  Skin:    General: Skin is warm and dry.  Neurological:     Mental Status: She is alert and oriented to person, place, and time.  Psychiatric:        Mood and Affect: Mood normal.        Behavior: Behavior normal.        Thought Content: Thought content normal.     MAU Course  Procedures  MDM Treatments in MAU included IV LR bolus, haldol IV and decadron. Reassessment after completed @ 1358- patient reports she is feeling a little better. Continues to be unable to urinate   MVI and pepcid IV ordered @ 1400 Reassessment after MVI and pepcid given, patient reports abdominal pain is resolved. Has not had emesis since arrival to MAU. Patient tolerated PO challenge. Patient reports that phenergan suppositories works best for her.   UDS positive for [facility]  Rx for phenergan suppositories sent to pharmacy of choice. Encouraged cessation of marijuana. Discussed reasons to return to MAU. Follow up as scheduled in the office. Return to MAU as needed. Pt stable at time of discharge.   Assessment and Plan   1. Cannabinoid hyperemesis syndrome   2. [redacted] weeks gestation of pregnancy    Discharge home Follow up as scheduled in the office for prenatal care Return to MAU as needed for reasons discussed  and/or emergencies  Rx for phenergan suppositories   Follow-up Information    [facility] Follow up.   Specialty: Obstetrics and Gynecology Why: Follow up as scheduled for prenatal care Contact information: [address] [place] [address] [phone]             Allergies as of 09/16/2020   No Known Allergies     Medication List    STOP taking these medications   metoCLOPramide 10 MG tablet Commonly known as: REGLAN   promethazine 12.5 MG tablet Commonly known as: PHENERGAN Replaced by: promethazine 25 MG suppository     TAKE these medications   esomeprazole 40 MG capsule Commonly known as: NexIUM Take 1 capsule (40 mg total) by mouth 2 (two) times daily before a meal.   FLUoxetine 20 MG capsule Commonly known as: PROZAC Take 40 mg by mouth daily.   hydrocortisone 1 % ointment Apply topically 2 (two) times daily.   Oxcarbazepine 300 MG tablet Commonly known as: TRILEPTAL Take 300 mg by mouth 2 (two) times daily.   prenatal multivitamin Tabs tablet Take 1 tablet by mouth daily at 12 noon.   promethazine 25 MG suppository Commonly known as: PHENERGAN Place 1 suppository (25 mg total) rectally every 6 (six) hours as needed for nausea or vomiting. Replaces: promethazine 12.5 MG tablet   QUEtiapine 50 MG tablet Commonly known as: SEROQUEL Take 50 mg by mouth at bedtime.       Sharyon Cable CNM 09/16/2020, 6:43 PM

## 2020-09-22 ENCOUNTER — Other Ambulatory Visit (HOSPITAL_COMMUNITY)
Admission: RE | Admit: 2020-09-22 | Discharge: 2020-09-22 | Disposition: A | Discharge status: Home / Self Care | Payer: Medicaid Other | Source: Ambulatory Visit | Attending: Nurse Practitioner | Admitting: Nurse Practitioner

## 2020-09-22 ENCOUNTER — Other Ambulatory Visit: Payer: Self-pay | Admitting: Nurse Practitioner

## 2020-09-22 DIAGNOSIS — N898 Other specified noninflammatory disorders of vagina: Secondary | ICD-10-CM | POA: Insufficient documentation

## 2020-09-25 ENCOUNTER — Other Ambulatory Visit: Payer: Self-pay

## 2020-09-25 ENCOUNTER — Inpatient Hospital Stay (HOSPITAL_COMMUNITY)
Admission: AD | Admit: 2020-09-25 | Discharge: 2020-09-26 | Disposition: A | Discharge status: Home / Self Care | Payer: Medicaid Other | Attending: Obstetrics and Gynecology | Admitting: Obstetrics and Gynecology

## 2020-09-25 ENCOUNTER — Encounter (HOSPITAL_COMMUNITY): Payer: Self-pay | Admitting: Obstetrics and Gynecology

## 2020-09-25 DIAGNOSIS — E860 Dehydration: Secondary | ICD-10-CM | POA: Insufficient documentation

## 2020-09-25 DIAGNOSIS — Z79899 Other long term (current) drug therapy: Secondary | ICD-10-CM | POA: Diagnosis not present

## 2020-09-25 DIAGNOSIS — K209 Esophagitis, unspecified without bleeding: Secondary | ICD-10-CM | POA: Diagnosis not present

## 2020-09-25 DIAGNOSIS — O99281 Endocrine, nutritional and metabolic diseases complicating pregnancy, first trimester: Secondary | ICD-10-CM | POA: Diagnosis not present

## 2020-09-25 DIAGNOSIS — O26891 Other specified pregnancy related conditions, first trimester: Secondary | ICD-10-CM | POA: Diagnosis not present

## 2020-09-25 DIAGNOSIS — O99321 Drug use complicating pregnancy, first trimester: Secondary | ICD-10-CM | POA: Insufficient documentation

## 2020-09-25 DIAGNOSIS — Z3A08 8 weeks gestation of pregnancy: Secondary | ICD-10-CM | POA: Diagnosis not present

## 2020-09-25 DIAGNOSIS — F17210 Nicotine dependence, cigarettes, uncomplicated: Secondary | ICD-10-CM | POA: Diagnosis not present

## 2020-09-25 DIAGNOSIS — F1290 Cannabis use, unspecified, uncomplicated: Secondary | ICD-10-CM | POA: Diagnosis not present

## 2020-09-25 DIAGNOSIS — O219 Vomiting of pregnancy, unspecified: Secondary | ICD-10-CM | POA: Diagnosis present

## 2020-09-25 DIAGNOSIS — O99611 Diseases of the digestive system complicating pregnancy, first trimester: Secondary | ICD-10-CM | POA: Diagnosis not present

## 2020-09-25 DIAGNOSIS — R109 Unspecified abdominal pain: Secondary | ICD-10-CM | POA: Insufficient documentation

## 2020-09-25 DIAGNOSIS — O99331 Smoking (tobacco) complicating pregnancy, first trimester: Secondary | ICD-10-CM | POA: Insufficient documentation

## 2020-09-25 DIAGNOSIS — O210 Mild hyperemesis gravidarum: Secondary | ICD-10-CM | POA: Insufficient documentation

## 2020-09-25 LAB — URINALYSIS, ROUTINE W REFLEX MICROSCOPIC
Bacteria, UA: NONE SEEN
Bilirubin Urine: NEGATIVE
Glucose, UA: 50 mg/dL — AB
Hgb urine dipstick: NEGATIVE
Ketones, ur: 80 mg/dL — AB
Leukocytes,Ua: NEGATIVE
Nitrite: NEGATIVE
Protein, ur: 100 mg/dL — AB
Specific Gravity, Urine: 1.025 (ref 1.005–1.030)
pH: 8 (ref 5.0–8.0)

## 2020-09-25 LAB — MOLECULAR ANCILLARY ONLY
Bacterial Vaginitis (gardnerella): POSITIVE — AB
Candida Glabrata: NEGATIVE
Candida Vaginitis: POSITIVE — AB
Chlamydia: NEGATIVE
Comment: NEGATIVE
Comment: NEGATIVE
Comment: NEGATIVE
Comment: NEGATIVE
Comment: NEGATIVE
Comment: NORMAL
Neisseria Gonorrhea: NEGATIVE
Trichomonas: NEGATIVE

## 2020-09-25 MED ORDER — HALOPERIDOL LACTATE 5 MG/ML IJ SOLN
5.0000 mg | Freq: Once | INTRAMUSCULAR | Status: AC
  Administered 2020-09-25: 5 mg via INTRAVENOUS
  Filled 2020-09-25: qty 1

## 2020-09-25 MED ORDER — ONDANSETRON HCL 4 MG/2ML IJ SOLN
4.0000 mg | Freq: Once | INTRAMUSCULAR | Status: AC
  Administered 2020-09-25: 4 mg via INTRAVENOUS
  Filled 2020-09-25: qty 2

## 2020-09-25 MED ORDER — LIDOCAINE VISCOUS HCL 2 % MT SOLN
15.0000 mL | Freq: Once | OROMUCOSAL | Status: AC
  Administered 2020-09-25: 15 mL via ORAL
  Filled 2020-09-25: qty 15

## 2020-09-25 MED ORDER — LACTATED RINGERS IV BOLUS
1000.0000 mL | Freq: Once | INTRAVENOUS | Status: AC
  Administered 2020-09-25: 1000 mL via INTRAVENOUS

## 2020-09-25 MED ORDER — SCOPOLAMINE 1 MG/3DAYS TD PT72
1.0000 | MEDICATED_PATCH | Freq: Once | TRANSDERMAL | Status: DC
  Administered 2020-09-25: 1.5 mg via TRANSDERMAL
  Filled 2020-09-25: qty 1

## 2020-09-25 MED ORDER — DEXAMETHASONE SODIUM PHOSPHATE 10 MG/ML IJ SOLN
10.0000 mg | INTRAMUSCULAR | Status: AC
  Administered 2020-09-25: 10 mg via INTRAVENOUS
  Filled 2020-09-25: qty 1

## 2020-09-25 MED ORDER — SODIUM CHLORIDE 0.9 % IV SOLN: INTRAVENOUS | Status: DC

## 2020-09-25 MED ORDER — SCOPOLAMINE 1 MG/3DAYS TD PT72: 1.0000 | MEDICATED_PATCH | TRANSDERMAL | Status: DC

## 2020-09-25 MED ORDER — ALUM & MAG HYDROXIDE-SIMETH 200-200-20 MG/5ML PO SUSP
30.0000 mL | Freq: Once | ORAL | Status: AC
  Administered 2020-09-25: 30 mL via ORAL
  Filled 2020-09-25: qty 30

## 2020-09-25 NOTE — MAU Note (Signed)
.   [name] Weber is a 24 y.o. at [redacted]w[redacted]d here in MAU reporting: nausea and vomiting that became worse at 0600 this morning. Has not been able to keep anything down. Last took phenergan suppository this morning 0630. She is also reporting dark red stools. No diarrhea. Has vomited over 10x since this morning. No VB or abnormal discharge. Reports upper abdominal pain.   Pain score: 10 Vitals:   09/25/20 1922  BP: (!) 141/84  Pulse: (!) 104  Resp: 19  Temp: 98 F (36.7 C)  SpO2: 100%      Lab orders placed from triage: UA

## 2020-09-25 NOTE — MAU Provider Note (Addendum)
History     CSN: [id number]  Arrival date and time: 09/25/20 1906   First Provider Initiated Contact with Patient 09/25/20 2000  Chief Complaint  Patient presents with  . Morning Sickness   [name] is a 24 y.o. year old G60P3013 female at [redacted]w[redacted]d weeks gestation who presents to MAU reporting N/V that became worse at 0600 this AM. She used a Phenergan suppository at 0630 and waited to take the oral Phenergan 30 mins later. She vomited up the oral dose and has been unable to keep anything down; vomited 10 times this morning. She also reports dark, red stools, but no diarrhea.  She is also having upper abdominal pain. No vaginal bleeding or abnormal vaginal discharge.   OB History    Gravida  5   Para  3   Term  3   Preterm      AB  1   Living  3     SAB  1   TAB  0   Ectopic      Multiple      Live Births  3           Past Medical History:  Diagnosis Date  . Chlamydia infection   . Trichomonas 01/2012  . UTI (lower urinary tract infection)     Past Surgical History:  Procedure Laterality Date  . NO PAST SURGERIES      Family History  Problem Relation Age of Onset  . Diabetes Maternal Aunt   . Healthy Mother   . Healthy Father   . Alcohol abuse Neg Hx   . Arthritis Neg Hx   . Asthma Neg Hx   . Birth defects Neg Hx   . Cancer Neg Hx   . COPD Neg Hx   . Depression Neg Hx   . Drug abuse Neg Hx   . Early death Neg Hx   . Hearing loss Neg Hx   . Heart disease Neg Hx   . Hyperlipidemia Neg Hx   . Hypertension Neg Hx   . Kidney disease Neg Hx   . Learning disabilities Neg Hx   . Mental illness Neg Hx   . Mental retardation Neg Hx   . Miscarriages / Stillbirths Neg Hx   . Stroke Neg Hx   . Vision loss Neg Hx     Social History   Tobacco Use  . Smoking status: Current Every Day Smoker    Packs/day: 0.50    Types: Cigarettes  . Smokeless tobacco: Never Used  Vaping Use  . Vaping Use: Never used  Substance Use Topics  . Alcohol  use: Not Currently  . Drug use: Yes    Types: Marijuana    Comment: Last used 08/31/20    Allergies: No Known Allergies  Medications Prior to Admission  Medication Sig Dispense Refill Last Dose  . Prenatal Vit-Fe Fumarate-FA (PRENATAL MULTIVITAMIN) TABS tablet Take 1 tablet by mouth daily at 12 noon.   09/25/2020 at 1000  . promethazine (PHENERGAN) 25 MG suppository Place 1 suppository (25 mg total) rectally every 6 (six) hours as needed for nausea or vomiting. 12 each 3 09/25/2020 at Unknown time  . esomeprazole (NEXIUM) 40 MG capsule Take 1 capsule (40 mg total) by mouth 2 (two) times daily before a meal. 60 capsule 3   . FLUoxetine (PROZAC) 20 MG capsule Take 40 mg by mouth daily.      . hydrocortisone 1 % ointment Apply topically 2 (two) times daily.     Marland Kitchen  Oxcarbazepine (TRILEPTAL) 300 MG tablet Take 300 mg by mouth 2 (two) times daily.     . QUEtiapine (SEROQUEL) 50 MG tablet Take 50 mg by mouth at bedtime.       Review of Systems  Constitutional: Positive for appetite change.  HENT: Negative.   Eyes: Negative.   Respiratory: Negative.   Cardiovascular: Negative.   Gastrointestinal: Positive for abdominal pain (upper), nausea and vomiting.  Endocrine: Negative.   Genitourinary: Negative.   Musculoskeletal: Negative.   Skin: Negative.   Allergic/Immunologic: Negative.   Neurological: Positive for weakness.  Hematological: Negative.   Psychiatric/Behavioral: Negative.    Physical Exam   Blood pressure (!) 136/91, pulse (!) 107, temperature 98 F (36.7 C), temperature source Oral, resp. rate 19, last menstrual period 07/28/2020, SpO2 100 %, unknown if currently breastfeeding.  Physical Exam Constitutional:      Appearance: Normal appearance. She is normal weight.  HENT:     Head: Normocephalic and atraumatic.  Cardiovascular:     Rate and Rhythm: Normal rate.     Pulses: Normal pulses.  Pulmonary:     Effort: Pulmonary effort is normal.  Abdominal:     General:  Abdomen is flat.     Palpations: Abdomen is soft.     Tenderness: There is no guarding or rebound.  Genitourinary:    Comments: Not indicated Musculoskeletal:     Cervical back: Normal range of motion.  Skin:    General: Skin is warm and dry.  Neurological:     Mental Status: She is alert and oriented to person, place, and time.  Psychiatric:        Mood and Affect: Mood normal.        Behavior: Behavior normal.        Thought Content: Thought content normal.        Judgment: Judgment normal.     MAU Course  Procedures  MDM LR 1000 ml bolus Haldol 5 mg IVP  Decadron 10 mg IVP  Report given to and care assumed by Wynelle Bourgeois, CNM  Raelyn Mora, MSN, CNM 09/25/2020, 8:00 PM   Assumed care Still nauseated and vomited We added Zofran and more IV fluids We later gave a GI cocktail for her esophagitis and it helped We also added a scopolamine patch  Assessment and Plan  Pregnancy at [redacted]w[redacted]d Hyperemesis vs cannaboid hyperemesis Dehydration Esophagitis  Discharge home Rx Zofran to use sparingly when other meds dont help Rx Scopolamine patch prn nausea Advance diet as tolerated  Start prenatal care Encouraged to return here or to other Urgent Care/ED if she develops worsening of symptoms, increase in pain, fever, or other concerning symptoms.    Aviva Signs, CNM

## 2020-09-26 DIAGNOSIS — Z3A08 8 weeks gestation of pregnancy: Secondary | ICD-10-CM

## 2020-09-26 DIAGNOSIS — E860 Dehydration: Secondary | ICD-10-CM

## 2020-09-26 DIAGNOSIS — O210 Mild hyperemesis gravidarum: Secondary | ICD-10-CM

## 2020-09-26 DIAGNOSIS — K209 Esophagitis, unspecified without bleeding: Secondary | ICD-10-CM | POA: Diagnosis not present

## 2020-09-26 DIAGNOSIS — O99611 Diseases of the digestive system complicating pregnancy, first trimester: Secondary | ICD-10-CM

## 2020-09-26 MED ORDER — ONDANSETRON 4 MG PO TBDP: 4.0000 mg | ORAL_TABLET | Freq: Four times a day (QID) | ORAL | 0 refills | Status: DC | PRN

## 2020-09-26 MED ORDER — SCOPOLAMINE 1 MG/3DAYS TD PT72: 1.0000 | MEDICATED_PATCH | TRANSDERMAL | 0 refills | Status: DC

## 2020-09-26 NOTE — Discharge Instructions (Signed)
Hyperemesis Gravidarum Hyperemesis gravidarum is a severe form of nausea and vomiting that happens during pregnancy. Hyperemesis is worse than morning sickness. It may cause you to have nausea or vomiting all day for many days. It may keep you from eating and drinking enough food and liquids, which can lead to dehydration, malnutrition, and weight loss. Hyperemesis usually occurs during the first half (the first 20 weeks) of pregnancy. It often goes away once a woman is in her second half of pregnancy. However, sometimes hyperemesis continues through an entire pregnancy. What are the causes? The cause of this condition is not known. It may be related to changes in chemicals (hormones) in the body during pregnancy, such as the high level of pregnancy hormone (human chorionic gonadotropin) or the increase in the female sex hormone (estrogen). What are the signs or symptoms? Symptoms of this condition include:  Nausea that does not go away.  Vomiting that does not allow you to keep any food down.  Weight loss.  Body fluid loss (dehydration).  Having no desire to eat, or not liking food that you have previously enjoyed. How is this diagnosed? This condition may be diagnosed based on:  A physical exam.  Your medical history.  Your symptoms.  Blood tests.  Urine tests. How is this treated? This condition is managed by controlling symptoms. This may include:  Following an eating plan. This can help lessen nausea and vomiting.  Taking prescription medicines. An eating plan and medicines are often used together to help control symptoms. If medicines do not help relieve nausea and vomiting, you may need to receive fluids through an IV at the hospital. Follow these instructions at home: Eating and drinking   Avoid the following: ? Drinking fluids with meals. Try not to drink anything during the 30 minutes before and after your meals. ? Drinking more than 1 cup of fluid at a  time. ? Eating foods that trigger your symptoms. These may include spicy foods, coffee, high-fat foods, very sweet foods, and acidic foods. ? Skipping meals. Nausea can be more intense on an empty stomach. If you cannot tolerate food, do not force it. Try sucking on ice chips or other frozen items and make up for missed calories later. ? Lying down within 2 hours after eating. ? Being exposed to environmental triggers. These may include food smells, smoky rooms, closed spaces, rooms with strong smells, warm or humid places, overly loud and noisy rooms, and rooms with motion or flickering lights. Try eating meals in a well-ventilated area that is free of strong smells. ? Quick and sudden changes in your movement. ? Taking iron pills and multivitamins that contain iron. If you take prescription iron pills, do not stop taking them unless your health care provider approves. ? Preparing food. The smell of food can spoil your appetite or trigger nausea.  To help relieve your symptoms: ? Listen to your body. Everyone is different and has different preferences. Find what works best for you. ? Eat and drink slowly. ? Eat 5-6 small meals daily instead of 3 large meals. Eating small meals and snacks can help you avoid an empty stomach. ? In the morning, before getting out of bed, eat a couple of crackers to avoid moving around on an empty stomach. ? Try eating starchy foods as these are usually tolerated well. Examples include cereal, toast, bread, potatoes, pasta, rice, and pretzels. ? Include at least 1 serving of protein with your meals and snacks. Protein options include   lean meats, poultry, seafood, beans, nuts, nut butters, eggs, cheese, and yogurt. ? Try eating a protein-rich snack before bed. Examples of a protein-rick snack include cheese and crackers or a peanut butter sandwich made with 1 slice of whole-wheat bread and 1 tsp (5 g) of peanut butter. ? Eat or suck on things that have ginger in them.  It may help relieve nausea. Add  tsp ground ginger to hot tea or choose ginger tea. ? Try drinking 100% fruit juice or an electrolyte drink. An electrolyte drink contains sodium, potassium, and chloride. ? Drink fluids that are cold, clear, and carbonated or sour. Examples include lemonade, ginger ale, lemon-lime soda, ice water, and sparkling water. ? Brush your teeth or use a mouth rinse after meals. ? Talk with your health care provider about starting a supplement of vitamin B6. General instructions  Take over-the-counter and prescription medicines only as told by your health care provider.  Follow instructions from your health care provider about eating or drinking restrictions.  Continue to take your prenatal vitamins as told by your health care provider. If you are having trouble taking your prenatal vitamins, talk with your health care provider about different options.  Keep all follow-up and pre-birth (prenatal) visits as told by your health care provider. This is important. Contact a health care provider if:  You have pain in your abdomen.  You have a severe headache.  You have vision problems.  You are losing weight.  You feel weak or dizzy. Get help right away if:  You cannot drink fluids without vomiting.  You vomit blood.  You have constant nausea and vomiting.  You are very weak.  You faint.  You have a fever and your symptoms suddenly get worse. Summary  Hyperemesis gravidarum is a severe form of nausea and vomiting that happens during pregnancy.  Making some changes to your eating habits may help relieve nausea and vomiting.  This condition may be managed with medicine.  If medicines do not help relieve nausea and vomiting, you may need to receive fluids through an IV at the hospital. This information is not intended to replace advice given to you by your health care provider. Make sure you discuss any questions you have with your health care  provider. Document Revised: 12/28/2017 Document Reviewed: 08/06/2016 Elsevier Patient Education  2020 [employer]  

## 2020-10-04 ENCOUNTER — Other Ambulatory Visit: Payer: Self-pay

## 2020-10-04 ENCOUNTER — Inpatient Hospital Stay (HOSPITAL_COMMUNITY)
Admission: AD | Admit: 2020-10-04 | Discharge: 2020-10-04 | Disposition: A | Discharge status: Home / Self Care | Payer: Medicaid Other | Attending: Family Medicine | Admitting: Family Medicine

## 2020-10-04 ENCOUNTER — Encounter (HOSPITAL_COMMUNITY): Payer: Self-pay | Admitting: Family Medicine

## 2020-10-04 DIAGNOSIS — F17210 Nicotine dependence, cigarettes, uncomplicated: Secondary | ICD-10-CM | POA: Diagnosis not present

## 2020-10-04 DIAGNOSIS — Z79899 Other long term (current) drug therapy: Secondary | ICD-10-CM | POA: Diagnosis not present

## 2020-10-04 DIAGNOSIS — O99011 Anemia complicating pregnancy, first trimester: Secondary | ICD-10-CM | POA: Insufficient documentation

## 2020-10-04 DIAGNOSIS — O99331 Smoking (tobacco) complicating pregnancy, first trimester: Secondary | ICD-10-CM | POA: Insufficient documentation

## 2020-10-04 DIAGNOSIS — D72829 Elevated white blood cell count, unspecified: Secondary | ICD-10-CM | POA: Diagnosis present

## 2020-10-04 DIAGNOSIS — O210 Mild hyperemesis gravidarum: Secondary | ICD-10-CM | POA: Diagnosis not present

## 2020-10-04 DIAGNOSIS — Z3A09 9 weeks gestation of pregnancy: Secondary | ICD-10-CM | POA: Diagnosis not present

## 2020-10-04 DIAGNOSIS — O26891 Other specified pregnancy related conditions, first trimester: Secondary | ICD-10-CM | POA: Insufficient documentation

## 2020-10-04 DIAGNOSIS — K5900 Constipation, unspecified: Secondary | ICD-10-CM | POA: Insufficient documentation

## 2020-10-04 DIAGNOSIS — O99611 Diseases of the digestive system complicating pregnancy, first trimester: Secondary | ICD-10-CM | POA: Diagnosis not present

## 2020-10-04 DIAGNOSIS — D573 Sickle-cell trait: Secondary | ICD-10-CM | POA: Insufficient documentation

## 2020-10-04 DIAGNOSIS — E876 Hypokalemia: Secondary | ICD-10-CM | POA: Insufficient documentation

## 2020-10-04 DIAGNOSIS — R109 Unspecified abdominal pain: Secondary | ICD-10-CM | POA: Diagnosis not present

## 2020-10-04 DIAGNOSIS — O99321 Drug use complicating pregnancy, first trimester: Secondary | ICD-10-CM | POA: Insufficient documentation

## 2020-10-04 DIAGNOSIS — E860 Dehydration: Secondary | ICD-10-CM | POA: Diagnosis not present

## 2020-10-04 DIAGNOSIS — F1290 Cannabis use, unspecified, uncomplicated: Secondary | ICD-10-CM | POA: Insufficient documentation

## 2020-10-04 LAB — CBC
HCT: 36.4 % (ref 36.0–46.0)
Hemoglobin: 13.1 g/dL (ref 12.0–15.0)
MCH: 28.4 pg (ref 26.0–34.0)
MCHC: 36 g/dL (ref 30.0–36.0)
MCV: 78.8 fL — ABNORMAL LOW (ref 80.0–100.0)
Platelets: 269 10*3/uL (ref 150–400)
RBC: 4.62 MIL/uL (ref 3.87–5.11)
RDW: 13.8 % (ref 11.5–15.5)
WBC: 18.4 10*3/uL — ABNORMAL HIGH (ref 4.0–10.5)
nRBC: 0 % (ref 0.0–0.2)

## 2020-10-04 LAB — COMPREHENSIVE METABOLIC PANEL
ALT: 15 U/L (ref 0–44)
AST: 24 U/L (ref 15–41)
Albumin: 4.1 g/dL (ref 3.5–5.0)
Alkaline Phosphatase: 35 U/L — ABNORMAL LOW (ref 38–126)
Anion gap: 15 (ref 5–15)
BUN: 9 mg/dL (ref 6–20)
CO2: 21 mmol/L — ABNORMAL LOW (ref 22–32)
Calcium: 9.7 mg/dL (ref 8.9–10.3)
Chloride: 98 mmol/L (ref 98–111)
Creatinine, Ser: 0.69 mg/dL (ref 0.44–1.00)
GFR, Estimated: >60 mL/min (ref >60)
Glucose, Bld: 122 mg/dL — ABNORMAL HIGH (ref 70–99)
Potassium: 3.2 mmol/L — ABNORMAL LOW (ref 3.5–5.1)
Sodium: 134 mmol/L — ABNORMAL LOW (ref 135–145)
Total Bilirubin: 0.9 mg/dL (ref 0.3–1.2)
Total Protein: 7.6 g/dL (ref 6.5–8.1)

## 2020-10-04 LAB — URINALYSIS, ROUTINE W REFLEX MICROSCOPIC
Bacteria, UA: NONE SEEN
Bilirubin Urine: NEGATIVE
Glucose, UA: 50 mg/dL — AB
Hgb urine dipstick: NEGATIVE
Ketones, ur: 80 mg/dL — AB
Leukocytes,Ua: NEGATIVE
Nitrite: NEGATIVE
Protein, ur: 100 mg/dL — AB
Specific Gravity, Urine: 1.029 (ref 1.005–1.030)
pH: 6 (ref 5.0–8.0)

## 2020-10-04 MED ORDER — DEXAMETHASONE SODIUM PHOSPHATE 10 MG/ML IJ SOLN
10.0000 mg | Freq: Once | INTRAMUSCULAR | Status: AC
  Administered 2020-10-04: 10 mg via INTRAVENOUS
  Filled 2020-10-04: qty 1

## 2020-10-04 MED ORDER — LIDOCAINE VISCOUS HCL 2 % MT SOLN
15.0000 mL | Freq: Once | OROMUCOSAL | Status: AC
  Administered 2020-10-04: 15 mL via ORAL
  Filled 2020-10-04: qty 15

## 2020-10-04 MED ORDER — LACTATED RINGERS IV SOLN: Freq: Once | INTRAVENOUS | Status: AC

## 2020-10-04 MED ORDER — PROMETHAZINE HCL 25 MG/ML IJ SOLN
12.5000 mg | Freq: Once | INTRAMUSCULAR | Status: AC
  Administered 2020-10-04: 12.5 mg via INTRAVENOUS
  Filled 2020-10-04: qty 1

## 2020-10-04 MED ORDER — PROMETHAZINE HCL 25 MG RE SUPP: 25.0000 mg | Freq: Four times a day (QID) | RECTAL | 3 refills | Status: DC | PRN

## 2020-10-04 MED ORDER — ALUM & MAG HYDROXIDE-SIMETH 200-200-20 MG/5ML PO SUSP
30.0000 mL | Freq: Once | ORAL | Status: AC
  Administered 2020-10-04: 30 mL via ORAL
  Filled 2020-10-04: qty 30

## 2020-10-04 NOTE — Discharge Instructions (Signed)
Hyperemesis Gravidarum Hyperemesis gravidarum is a severe form of nausea and vomiting that happens during pregnancy. Hyperemesis is worse than morning sickness. It may cause you to have nausea or vomiting all day for many days. It may keep you from eating and drinking enough food and liquids, which can lead to dehydration, malnutrition, and weight loss. Hyperemesis usually occurs during the first half (the first 20 weeks) of pregnancy. It often goes away once a woman is in her second half of pregnancy. However, sometimes hyperemesis continues through an entire pregnancy. What are the causes? The cause of this condition is not known. It may be related to changes in chemicals (hormones) in the body during pregnancy, such as the high level of pregnancy hormone (human chorionic gonadotropin) or the increase in the female sex hormone (estrogen). What are the signs or symptoms? Symptoms of this condition include:  Nausea that does not go away.  Vomiting that does not allow you to keep any food down.  Weight loss.  Body fluid loss (dehydration).  Having no desire to eat, or not liking food that you have previously enjoyed. How is this diagnosed? This condition may be diagnosed based on:  A physical exam.  Your medical history.  Your symptoms.  Blood tests.  Urine tests. How is this treated? This condition is managed by controlling symptoms. This may include:  Following an eating plan. This can help lessen nausea and vomiting.  Taking prescription medicines. An eating plan and medicines are often used together to help control symptoms. If medicines do not help relieve nausea and vomiting, you may need to receive fluids through an IV at the hospital. Follow these instructions at home: Eating and drinking   Avoid the following: ? Drinking fluids with meals. Try not to drink anything during the 30 minutes before and after your meals. ? Drinking more than 1 cup of fluid at a  time. ? Eating foods that trigger your symptoms. These may include spicy foods, coffee, high-fat foods, very sweet foods, and acidic foods. ? Skipping meals. Nausea can be more intense on an empty stomach. If you cannot tolerate food, do not force it. Try sucking on ice chips or other frozen items and make up for missed calories later. ? Lying down within 2 hours after eating. ? Being exposed to environmental triggers. These may include food smells, smoky rooms, closed spaces, rooms with strong smells, warm or humid places, overly loud and noisy rooms, and rooms with motion or flickering lights. Try eating meals in a well-ventilated area that is free of strong smells. ? Quick and sudden changes in your movement. ? Taking iron pills and multivitamins that contain iron. If you take prescription iron pills, do not stop taking them unless your health care provider approves. ? Preparing food. The smell of food can spoil your appetite or trigger nausea.  To help relieve your symptoms: ? Listen to your body. Everyone is different and has different preferences. Find what works best for you. ? Eat and drink slowly. ? Eat 5-6 small meals daily instead of 3 large meals. Eating small meals and snacks can help you avoid an empty stomach. ? In the morning, before getting out of bed, eat a couple of crackers to avoid moving around on an empty stomach. ? Try eating starchy foods as these are usually tolerated well. Examples include cereal, toast, bread, potatoes, pasta, rice, and pretzels. ? Include at least 1 serving of protein with your meals and snacks. Protein options include   lean meats, poultry, seafood, beans, nuts, nut butters, eggs, cheese, and yogurt. ? Try eating a protein-rich snack before bed. Examples of a protein-rick snack include cheese and crackers or a peanut butter sandwich made with 1 slice of whole-wheat bread and 1 tsp (5 g) of peanut butter. ? Eat or suck on things that have ginger in them.  It may help relieve nausea. Add  tsp ground ginger to hot tea or choose ginger tea. ? Try drinking 100% fruit juice or an electrolyte drink. An electrolyte drink contains sodium, potassium, and chloride. ? Drink fluids that are cold, clear, and carbonated or sour. Examples include lemonade, ginger ale, lemon-lime soda, ice water, and sparkling water. ? Brush your teeth or use a mouth rinse after meals. ? Talk with your health care provider about starting a supplement of vitamin B6. General instructions  Take over-the-counter and prescription medicines only as told by your health care provider.  Follow instructions from your health care provider about eating or drinking restrictions.  Continue to take your prenatal vitamins as told by your health care provider. If you are having trouble taking your prenatal vitamins, talk with your health care provider about different options.  Keep all follow-up and pre-birth (prenatal) visits as told by your health care provider. This is important. Contact a health care provider if:  You have pain in your abdomen.  You have a severe headache.  You have vision problems.  You are losing weight.  You feel weak or dizzy. Get help right away if:  You cannot drink fluids without vomiting.  You vomit blood.  You have constant nausea and vomiting.  You are very weak.  You faint.  You have a fever and your symptoms suddenly get worse. Summary  Hyperemesis gravidarum is a severe form of nausea and vomiting that happens during pregnancy.  Making some changes to your eating habits may help relieve nausea and vomiting.  This condition may be managed with medicine.  If medicines do not help relieve nausea and vomiting, you may need to receive fluids through an IV at the hospital. This information is not intended to replace advice given to you by your health care provider. Make sure you discuss any questions you have with your health care  provider. Document Revised: 12/28/2017 Document Reviewed: 08/06/2016 Elsevier Patient Education  2020 [employer]   Hyperemesis Gravidarum Hyperemesis gravidarum is a severe form of nausea and vomiting that happens during pregnancy. Hyperemesis is worse than morning sickness. It may cause you to have nausea or vomiting all day for many days. It may keep you from eating and drinking enough food and liquids, which can lead to dehydration, malnutrition, and weight loss. Hyperemesis usually occurs during the first half (the first 20 weeks) of pregnancy. It often goes away once a woman is in her second half of pregnancy. However, sometimes hyperemesis continues through an entire pregnancy. What are the causes? The cause of this condition is not known. It may be related to changes in chemicals (hormones) in the body during pregnancy, such as the high level of pregnancy hormone (human chorionic gonadotropin) or the increase in the female sex hormone (estrogen). What are the signs or symptoms? Symptoms of this condition include:  Nausea that does not go away.  Vomiting that does not allow you to keep any food down.  Weight loss.  Body fluid loss (dehydration).  Having no desire to eat, or not liking food that you have previously enjoyed. How is this diagnosed?  This condition may be diagnosed based on:  A physical exam.  Your medical history.  Your symptoms.  Blood tests.  Urine tests. How is this treated? This condition is managed by controlling symptoms. This may include:  Following an eating plan. This can help lessen nausea and vomiting.  Taking prescription medicines. An eating plan and medicines are often used together to help control symptoms. If medicines do not help relieve nausea and vomiting, you may need to receive fluids through an IV at the hospital. Follow these instructions at home: Eating and drinking   Avoid the following: ? Drinking fluids with meals. Try not  to drink anything during the 30 minutes before and after your meals. ? Drinking more than 1 cup of fluid at a time. ? Eating foods that trigger your symptoms. These may include spicy foods, coffee, high-fat foods, very sweet foods, and acidic foods. ? Skipping meals. Nausea can be more intense on an empty stomach. If you cannot tolerate food, do not force it. Try sucking on ice chips or other frozen items and make up for missed calories later. ? Lying down within 2 hours after eating. ? Being exposed to environmental triggers. These may include food smells, smoky rooms, closed spaces, rooms with strong smells, warm or humid places, overly loud and noisy rooms, and rooms with motion or flickering lights. Try eating meals in a well-ventilated area that is free of strong smells. ? Quick and sudden changes in your movement. ? Taking iron pills and multivitamins that contain iron. If you take prescription iron pills, do not stop taking them unless your health care provider approves. ? Preparing food. The smell of food can spoil your appetite or trigger nausea.  To help relieve your symptoms: ? Listen to your body. Everyone is different and has different preferences. Find what works best for you. ? Eat and drink slowly. ? Eat 5-6 small meals daily instead of 3 large meals. Eating small meals and snacks can help you avoid an empty stomach. ? In the morning, before getting out of bed, eat a couple of crackers to avoid moving around on an empty stomach. ? Try eating starchy foods as these are usually tolerated well. Examples include cereal, toast, bread, potatoes, pasta, rice, and pretzels. ? Include at least 1 serving of protein with your meals and snacks. Protein options include lean meats, poultry, seafood, beans, nuts, nut butters, eggs, cheese, and yogurt. ? Try eating a protein-rich snack before bed. Examples of a protein-rick snack include cheese and crackers or a peanut butter sandwich made with 1  slice of whole-wheat bread and 1 tsp (5 g) of peanut butter. ? Eat or suck on things that have ginger in them. It may help relieve nausea. Add  tsp ground ginger to hot tea or choose ginger tea. ? Try drinking 100% fruit juice or an electrolyte drink. An electrolyte drink contains sodium, potassium, and chloride. ? Drink fluids that are cold, clear, and carbonated or sour. Examples include lemonade, ginger ale, lemon-lime soda, ice water, and sparkling water. ? Brush your teeth or use a mouth rinse after meals. ? Talk with your health care provider about starting a supplement of vitamin B6. General instructions  Take over-the-counter and prescription medicines only as told by your health care provider.  Follow instructions from your health care provider about eating or drinking restrictions.  Continue to take your prenatal vitamins as told by your health care provider. If you are having trouble taking your prenatal  vitamins, talk with your health care provider about different options.  Keep all follow-up and pre-birth (prenatal) visits as told by your health care provider. This is important. Contact a health care provider if:  You have pain in your abdomen.  You have a severe headache.  You have vision problems.  You are losing weight.  You feel weak or dizzy. Get help right away if:  You cannot drink fluids without vomiting.  You vomit blood.  You have constant nausea and vomiting.  You are very weak.  You faint.  You have a fever and your symptoms suddenly get worse. Summary  Hyperemesis gravidarum is a severe form of nausea and vomiting that happens during pregnancy.  Making some changes to your eating habits may help relieve nausea and vomiting.  This condition may be managed with medicine.  If medicines do not help relieve nausea and vomiting, you may need to receive fluids through an IV at the hospital. This information is not intended to replace advice given to  you by your health care provider. Make sure you discuss any questions you have with your health care provider. Document Revised: 12/28/2017 Document Reviewed: 08/06/2016 Elsevier Patient Education  2020 [employer].

## 2020-10-04 NOTE — MAU Provider Note (Signed)
Chief Complaint: Abdominal Pain and Hyperemesis Gravidarum   First Provider Initiated Contact with Patient 10/04/20 0334        SUBJECTIVE HPI: [name] Weber is a 24 y.o. [id number] at [redacted]w[redacted]d by LMP who presents to maternity admissions reporting recurrent nausea and vomiting.  Last took Zofran at 2pm  Did not take anymore this evening.  Has had multiple visits for this.  There has been some association with cannaboid hyperemesis, but she states she Stopped 3 weeks ago. Also wants to stop cigarette smoking.  Using Scopolamine patch with some success.  Has gained a little weight 1kg since 9/26.  . She denies vaginal bleeding, vaginal itching/burning, urinary symptoms, h/a, dizziness, or fever/chills.    History is remarkable for chronic nausea/vomiting (predating pregnancy), Chronic Leukocytosis, sickle trait, alcohol abuse, cannaboid hyperemesis   Has had GI workup (see notes under problem list and notes from April 2021)  Abdominal Pain This is a recurrent problem. The onset quality is gradual. The problem occurs intermittently. The quality of the pain is cramping. Associated symptoms include constipation, nausea and vomiting. Pertinent negatives include no diarrhea or dysuria. Nothing aggravates the pain. The pain is relieved by nothing. She has tried nothing for the symptoms.  Emesis  This is a recurrent problem. The problem has been waxing and waning. There has been no fever. Associated symptoms include abdominal pain. Pertinent negatives include no chills, diarrhea or dizziness. Treatments tried: zofran, scop patch. The treatment provided mild relief.   RN Note: Pt reports generalized abd pain since this pm, worsening. Vomited all afternoon. Denies fever  Past Medical History:  Diagnosis Date  . Chlamydia infection   . Trichomonas 01/2012  . UTI (lower urinary tract infection)    Past Surgical History:  Procedure Laterality Date  . NO PAST SURGERIES     Social History   Socioeconomic  History  . Marital status: Single    Spouse name: Not on file  . Number of children: Not on file  . Years of education: Not on file  . Highest education level: Not on file  Occupational History  . Not on file  Tobacco Use  . Smoking status: Current Every Day Smoker    Packs/day: 0.50    Types: Cigarettes  . Smokeless tobacco: Never Used  Vaping Use  . Vaping Use: Never used  Substance and Sexual Activity  . Alcohol use: Not Currently  . Drug use: Yes    Types: Marijuana    Comment: Last used 08/31/20  . Sexual activity: Yes    Partners: Male    Birth control/protection: None  Other Topics Concern  . Not on file  Social History Narrative  . Not on file   Social Determinants of Health   Financial Resource Strain:   . Difficulty of Paying Living Expenses: Not on file  Food Insecurity:   . Worried About Programme researcher, broadcasting/film/video in the Last Year: Not on file  . Ran Out of Food in the Last Year: Not on file  Transportation Needs:   . Lack of Transportation (Medical): Not on file  . Lack of Transportation (Non-Medical): Not on file  Physical Activity:   . Days of Exercise per Week: Not on file  . Minutes of Exercise per Session: Not on file  Stress:   . Feeling of Stress : Not on file  Social Connections:   . Frequency of Communication with Friends and Family: Not on file  . Frequency of Social Gatherings with Friends  and Family: Not on file  . Attends Religious Services: Not on file  . Active Member of Clubs or Organizations: Not on file  . Attends Banker Meetings: Not on file  . Marital Status: Not on file  Intimate Partner Violence:   . Fear of Current or Ex-Partner: Not on file  . Emotionally Abused: Not on file  . Physically Abused: Not on file  . Sexually Abused: Not on file   No current facility-administered medications on file prior to encounter.   Current Outpatient Medications on File Prior to Encounter  Medication Sig Dispense Refill  .  esomeprazole (NEXIUM) 40 MG capsule Take 1 capsule (40 mg total) by mouth 2 (two) times daily before a meal. 60 capsule 3  . ondansetron (ZOFRAN ODT) 4 MG disintegrating tablet Take 1 tablet (4 mg total) by mouth every 6 (six) hours as needed for nausea. 20 tablet 0  . Prenatal Vit-Fe Fumarate-FA (PRENATAL MULTIVITAMIN) TABS tablet Take 1 tablet by mouth daily at 12 noon.    . promethazine (PHENERGAN) 25 MG suppository Place 1 suppository (25 mg total) rectally every 6 (six) hours as needed for nausea or vomiting. 12 each 3  . QUEtiapine (SEROQUEL) 50 MG tablet Take 50 mg by mouth at bedtime.    Marland Kitchen scopolamine (TRANSDERM-SCOP) 1 MG/3DAYS Place 1 patch (1.5 mg total) onto the skin every 3 (three) days. 10 patch 0  . FLUoxetine (PROZAC) 20 MG capsule Take 40 mg by mouth daily.     . hydrocortisone 1 % ointment Apply topically 2 (two) times daily.    . Oxcarbazepine (TRILEPTAL) 300 MG tablet Take 300 mg by mouth 2 (two) times daily.     No Known Allergies  I have reviewed patient's Past Medical Hx, Surgical Hx, Family Hx, Social Hx, medications and allergies.   ROS:  Review of Systems  Constitutional: Negative for chills.  Gastrointestinal: Positive for abdominal pain, constipation, nausea and vomiting. Negative for diarrhea.  Genitourinary: Negative for dysuria.  Neurological: Negative for dizziness.   Review of Systems  Other systems negative   Physical Exam  Physical Exam Patient Vitals for the past 24 hrs:  BP Temp Pulse Resp SpO2 Height Weight  10/04/20 0309 (!) 122/91 98.6 F (37 C) 92 19 98 % 5\' 3"  (1.6 m) 65.3 kg   Constitutional: Well-developed, well-nourished female in no acute distress.  Cardiovascular: normal rate Respiratory: normal effort GI: Abd soft, non-tender. Pos BS x 4 MS: Extremities nontender, no edema, normal ROM Neurologic: Alert and oriented x 4.  GU: Neg CVAT.  PELVIC EXAM: deferred FHT 160 by bedside  LAB RESULTS Results for orders placed or  performed during the hospital encounter of 10/04/20 (from the past 24 hour(s))  Urinalysis, Routine w reflex microscopic Urine, Clean Catch     Status: Abnormal   Collection Time: 10/04/20  3:05 AM  Result Value Ref Range   Color, Urine AMBER (A) YELLOW   APPearance HAZY (A) CLEAR   Specific Gravity, Urine 1.029 1.005 - 1.030   pH 6.0 5.0 - 8.0   Glucose, UA 50 (A) NEGATIVE mg/dL   Hgb urine dipstick NEGATIVE NEGATIVE   Bilirubin Urine NEGATIVE NEGATIVE   Ketones, ur 80 (A) NEGATIVE mg/dL   Protein, ur 10/06/20 (A) NEGATIVE mg/dL   Nitrite NEGATIVE NEGATIVE   Leukocytes,Ua NEGATIVE NEGATIVE   RBC / HPF 6-10 0 - 5 RBC/hpf   WBC, UA 6-10 0 - 5 WBC/hpf   Bacteria, UA NONE SEEN NONE SEEN  Squamous Epithelial / LPF 6-10 0 - 5   Mucus PRESENT   Comprehensive metabolic panel     Status: Abnormal   Collection Time: 10/04/20  3:37 AM  Result Value Ref Range   Sodium 134 (L) 135 - 145 mmol/L   Potassium 3.2 (L) 3.5 - 5.1 mmol/L   Chloride 98 98 - 111 mmol/L   CO2 21 (L) 22 - 32 mmol/L   Glucose, Bld 122 (H) 70 - 99 mg/dL   BUN 9 6 - 20 mg/dL   Creatinine, Ser 6.57 0.44 - 1.00 mg/dL   Calcium 9.7 8.9 - 84.6 mg/dL   Total Protein 7.6 6.5 - 8.1 g/dL   Albumin 4.1 3.5 - 5.0 g/dL   AST 24 15 - 41 U/L   ALT 15 0 - 44 U/L   Alkaline Phosphatase 35 (L) 38 - 126 U/L   Total Bilirubin 0.9 0.3 - 1.2 mg/dL   GFR, Estimated >96 >29 mL/min   Anion gap 15 5 - 15  CBC     Status: Abnormal   Collection Time: 10/04/20  3:37 AM  Result Value Ref Range   WBC 18.4 (H) 4.0 - 10.5 K/uL   RBC 4.62 3.87 - 5.11 MIL/uL   Hemoglobin 13.1 12.0 - 15.0 g/dL   HCT 52.8 36 - 46 %   MCV 78.8 (L) 80.0 - 100.0 fL   MCH 28.4 26.0 - 34.0 pg   MCHC 36.0 30.0 - 36.0 g/dL   RDW 41.3 24.4 - 01.0 %   Platelets 269 150 - 400 K/uL   nRBC 0.0 0.0 - 0.2 %        IMAGING Bedside US for viability (has never been imaged during this pregnancy) Pt informed that the ultrasound is considered a limited OB ultrasound and is  not intended to be a complete ultrasound exam.  Patient also informed that the ultrasound is not being completed with the intent of assessing for fetal or placental anomalies or any pelvic abnormalities.  Explained that the purpose of today's ultrasound is to assess for viability.   Patient acknowledges the purpose of the exam and the limitations of the study.    Single GS with single fetus, very active, measures c/w 10 wks, HR 160s  MAU Management/MDM: Ordered IV fluids x 2 liters Phenergan given IV Decadron given IV GI cocktail given for esophageal discomfort Good relief from above   ASSESSMENT Single IUP at [redacted]w[redacted]d Hyperemesis  PLAN Discharge home Refilled Rx for Phenergan suppositories Resume meds at home for nausea Has New OB appt at [facility] Oct 20th  Pt stable at time of discharge. Encouraged to return here or to other Urgent Care/ED if she develops worsening of symptoms, increase in pain, fever, or other concerning symptoms.    Wynelle Bourgeois CNM, MSN Certified Nurse-Midwife 10/04/2020  3:34 AM

## 2020-10-04 NOTE — MAU Note (Signed)
Pt reports generalized abd pain since this pm, worsening. Vomited all afternoon. Denies fever

## 2020-10-10 ENCOUNTER — Ambulatory Visit (INDEPENDENT_AMBULATORY_CARE_PROVIDER_SITE_OTHER): Payer: Medicaid Other

## 2020-10-10 ENCOUNTER — Other Ambulatory Visit: Payer: Self-pay

## 2020-10-10 VITALS — BP 128/77 | HR 104 | Ht 63.0 in | Wt 145.6 lb

## 2020-10-10 DIAGNOSIS — Z789 Other specified health status: Secondary | ICD-10-CM

## 2020-10-10 DIAGNOSIS — O3680X0 Pregnancy with inconclusive fetal viability, not applicable or unspecified: Secondary | ICD-10-CM | POA: Diagnosis not present

## 2020-10-10 DIAGNOSIS — Z3481 Encounter for supervision of other normal pregnancy, first trimester: Secondary | ICD-10-CM | POA: Insufficient documentation

## 2020-10-10 DIAGNOSIS — Z3A10 10 weeks gestation of pregnancy: Secondary | ICD-10-CM

## 2020-10-10 MED ORDER — BLOOD PRESSURE KIT DEVI: 1.0000 | 0 refills | Status: DC

## 2020-10-10 NOTE — Progress Notes (Signed)
Patient was assessed and managed by nursing staff during this encounter. I have reviewed the chart and agree with the documentation and plan. I have also made any necessary editorial changes.  Warden Fillers, MD 10/10/2020 4:18 PM

## 2020-10-10 NOTE — Progress Notes (Signed)
PRENATAL INTAKE SUMMARY  Ms. Blinder presents today New OB Nurse Interview.  OB History    Gravida  5   Para  3   Term  3   Preterm      AB  1   Living  3     SAB  1   TAB  0   Ectopic      Multiple      Live Births  3          I have reviewed the patient's medical, obstetrical, social, and family histories, medications, and available lab results.  SUBJECTIVE She has no unusual complaints  OBJECTIVE Initial Physical Exam (New OB)  GENERAL APPEARANCE: alert, well appearing   ASSESSMENT Normal pregnancy  PLAN Prenatal care to be completed at [place] labs will be completed at [facility] Provider Visit  Baby Scripts Ordered Blood Pressure kit ordered U/S performed today reveals [id number] live IUP. FHR 163 PHQ9 score: 0 GAD 7 score: 0

## 2020-10-17 ENCOUNTER — Ambulatory Visit (INDEPENDENT_AMBULATORY_CARE_PROVIDER_SITE_OTHER): Payer: Medicaid Other | Admitting: Obstetrics and Gynecology

## 2020-10-17 ENCOUNTER — Other Ambulatory Visit (HOSPITAL_COMMUNITY)
Admission: RE | Admit: 2020-10-17 | Discharge: 2020-10-17 | Disposition: A | Discharge status: Home / Self Care | Payer: Medicaid Other | Source: Ambulatory Visit | Attending: Obstetrics and Gynecology | Admitting: Obstetrics and Gynecology

## 2020-10-17 ENCOUNTER — Other Ambulatory Visit: Payer: Self-pay

## 2020-10-17 VITALS — BP 111/66 | HR 90 | Wt 145.0 lb

## 2020-10-17 DIAGNOSIS — Z3481 Encounter for supervision of other normal pregnancy, first trimester: Secondary | ICD-10-CM | POA: Insufficient documentation

## 2020-10-17 DIAGNOSIS — E876 Hypokalemia: Secondary | ICD-10-CM

## 2020-10-17 DIAGNOSIS — R1116 Cannabis hyperemesis syndrome: Secondary | ICD-10-CM

## 2020-10-17 DIAGNOSIS — Z3A11 11 weeks gestation of pregnancy: Secondary | ICD-10-CM

## 2020-10-17 DIAGNOSIS — Z87891 Personal history of nicotine dependence: Secondary | ICD-10-CM

## 2020-10-17 DIAGNOSIS — D573 Sickle-cell trait: Secondary | ICD-10-CM

## 2020-10-17 DIAGNOSIS — R112 Nausea with vomiting, unspecified: Secondary | ICD-10-CM

## 2020-10-17 DIAGNOSIS — F1010 Alcohol abuse, uncomplicated: Secondary | ICD-10-CM

## 2020-10-17 DIAGNOSIS — F1290 Cannabis use, unspecified, uncomplicated: Secondary | ICD-10-CM

## 2020-10-17 MED ORDER — PROMETHAZINE HCL 25 MG PO TABS: 25.0000 mg | ORAL_TABLET | Freq: Four times a day (QID) | ORAL | 1 refills | Status: DC | PRN

## 2020-10-17 NOTE — Progress Notes (Signed)
° °  PRENATAL VISIT NOTE  Subjective:  [name] Weber is a 24 y.o. [id number] at [redacted]w[redacted]d being seen today for ongoing prenatal care.  She is currently monitored for the following issues for this high-risk pregnancy and has Sickle cell trait (HCC); Intractable nausea and vomiting; Hypokalemia; Cannabinoid hyperemesis syndrome; Alcohol abuse; Leukocytosis; Encounter for supervision of other normal pregnancy, first trimester; and [redacted] weeks gestation of pregnancy on their problem list.  Patient reports concerns of daily nausea and vomiting. Currently taking promethazine, scopolamine patch and zofran with some benefit.  Contractions: Not present. Vag. Bleeding: None.   . Denies leaking of fluid.   The following portions of the patient's history were reviewed and updated as appropriate: allergies, current medications, past family history, past medical history, past social history, past surgical history and problem list.   Objective:   Vitals:   10/17/20 0918  BP: 111/66  Pulse: 90  Weight: 145 lb (65.8 kg)    Fetal Status: Fetal Heart Rate (bpm): 166         General:  Alert, oriented and cooperative. Patient is in no acute distress.  Skin: Skin is warm and dry. No rash noted.   Cardiovascular: Normal heart rate noted  Respiratory: Normal respiratory effort, no problems with respiration noted  Abdomen: Soft, gravid, appropriate for gestational age.  Pain/Pressure: Absent     Pelvic: Cervical exam deferred        Extremities: Normal range of motion.  Edema: None  Mental Status: Normal mood and affect. Normal behavior. Normal judgment and thought content.   Assessment and Plan:  Pregnancy: [id number] at [redacted]w[redacted]d 1. Encounter for supervision of other normal pregnancy, first trimester, [redacted] weeks gestation of pregnancy: Pt reports for new prenatal visit today. Dating by L/10. No reported concerns today except for nausea/vomiting as noted below. Plan to breast and bottle feed. Declines postpartum birth  control today. - pt prefers to defer flu vaccine today given ongoing n/v - Culture, OB Urine - CBC/D/Plt+RPR+Rh+ABO+Rub Ab... - Genetic Screening - Cervicovaginal ancillary only( [place]) - [place] MFM OB COMP + 14 WK; Future - RTC in 4 weeks for f/u prenatal appt  2. Cannabinoid hyperemesis syndrome: Pt reports daily emesis but normal vital signs today and tolerating fluids and some po intake. Weight stable from prior on 10/20. Will continue to closely monitor. - praised pt for cessation and discussed benefits of continued cessation - prescribed po phenergan 25mg  q6hr prn per pt request - provided strict return precautions for worsening symptoms, inability to tolerate po intake  3. Sickle cell trait (HCC)  4. Alcohol abuse: No reported use in current pregnancy.  5. Depression: Pt reports good adherence to seroquel and fluoxetine. Currently prescribed by pt's mental health provider. No current safety concerns. - provided strict return precautions for safety concerns  6. Former Tobacco Use: pt reports quitting with +pregnancy test.   Preterm labor symptoms and general obstetric precautions including but not limited to vaginal bleeding, contractions, leaking of fluid and fetal movement were reviewed in detail with the patient. Please refer to After Visit Summary for other counseling recommendations.   Return in about 4 weeks (around 11/14/2020) for in person, f/u prenatal appt with any provider.  Future Appointments  Date Time Provider Department Center  11/14/2020  9:55 AM 11/16/2020, NP [facility] None  12/10/2020 10:45 AM [facility] US5 [facility]    Virginie Josten, 12/12/2020, MD OB Fellow, Faculty Practice 10/18/2020 9:25 PM

## 2020-10-17 NOTE — Patient Instructions (Signed)
First Trimester of Pregnancy  The first trimester of pregnancy is from week 1 until the end of week 13 (months 1 through 3). During this time, your baby will begin to develop inside you. At 6-8 weeks, the eyes and face are formed, and the heartbeat can be seen on ultrasound. At the end of 12 weeks, all the baby's organs are formed. Prenatal care is all the medical care you receive before the birth of your baby. Make sure you get good prenatal care and follow all of your doctor's instructions. Follow these instructions at home: Medicines  Take over-the-counter and prescription medicines only as told by your doctor. Some medicines are safe and some medicines are not safe during pregnancy.  Take a prenatal vitamin that contains at least 600 micrograms (mcg) of folic acid.  If you have trouble pooping (constipation), take medicine that will make your stool soft (stool softener) if your doctor approves. Eating and drinking   Eat regular, healthy meals.  Your doctor will tell you the amount of weight gain that is right for you.  Avoid raw meat and uncooked cheese.  If you feel sick to your stomach (nauseous) or throw up (vomit): ? Eat 4 or 5 small meals a day instead of 3 large meals. ? Try eating a few soda crackers. ? Drink liquids between meals instead of during meals.  To prevent constipation: ? Eat foods that are high in fiber, like fresh fruits and vegetables, whole grains, and beans. ? Drink enough fluids to keep your pee (urine) clear or pale yellow. Activity  Exercise only as told by your doctor. Stop exercising if you have cramps or pain in your lower belly (abdomen) or low back.  Do not exercise if it is too hot, too humid, or if you are in a place of great height (high altitude).  Try to avoid standing for long periods of time. Move your legs often if you must stand in one place for a long time.  Avoid heavy lifting.  Wear low-heeled shoes. Sit and stand up  straight.  You can have sex unless your doctor tells you not to. Relieving pain and discomfort  Wear a good support bra if your breasts are sore.  Take warm water baths (sitz baths) to soothe pain or discomfort caused by hemorrhoids. Use hemorrhoid cream if your doctor says it is okay.  Rest with your legs raised if you have leg cramps or low back pain.  If you have puffy, bulging veins (varicose veins) in your legs: ? Wear support hose or compression stockings as told by your doctor. ? Raise (elevate) your feet for 15 minutes, 3-4 times a day. ? Limit salt in your food. Prenatal care  Schedule your prenatal visits by the twelfth week of pregnancy.  Write down your questions. Take them to your prenatal visits.  Keep all your prenatal visits as told by your doctor. This is important. Safety  Wear your seat belt at all times when driving.  Make a list of emergency phone numbers. The list should include numbers for family, friends, the hospital, and police and fire departments. General instructions  Ask your doctor for a referral to a local prenatal class. Begin classes no later than at the start of month 6 of your pregnancy.  Ask for help if you need counseling or if you need help with nutrition. Your doctor can give you advice or tell you where to go for help.  Do not use hot tubs, steam   rooms, or saunas.  Do not douche or use tampons or scented sanitary pads.  Do not cross your legs for long periods of time.  Avoid all herbs and alcohol. Avoid drugs that are not approved by your doctor.  Do not use any tobacco products, including cigarettes, chewing tobacco, and electronic cigarettes. If you need help quitting, ask your doctor. You may get counseling or other support to help you quit.  Avoid cat litter boxes and soil used by cats. These carry germs that can cause birth defects in the baby and can cause a loss of your baby (miscarriage) or stillbirth.  Visit your dentist.  At home, brush your teeth with a soft toothbrush. Be gentle when you floss. Contact a doctor if:  You are dizzy.  You have mild cramps or pressure in your lower belly.  You have a nagging pain in your belly area.  You continue to feel sick to your stomach, you throw up, or you have watery poop (diarrhea).  You have a bad smelling fluid coming from your vagina.  You have pain when you pee (urinate).  You have increased puffiness (swelling) in your face, hands, legs, or ankles. Get help right away if:  You have a fever.  You are leaking fluid from your vagina.  You have spotting or bleeding from your vagina.  You have very bad belly cramping or pain.  You gain or lose weight rapidly.  You throw up blood. It may look like coffee grounds.  You are around people who have German measles, fifth disease, or chickenpox.  You have a very bad headache.  You have shortness of breath.  You have any kind of trauma, such as from a fall or a car accident. Summary  The first trimester of pregnancy is from week 1 until the end of week 13 (months 1 through 3).  To take care of yourself and your unborn baby, you will need to eat healthy meals, take medicines only if your doctor tells you to do so, and do activities that are safe for you and your baby.  Keep all follow-up visits as told by your doctor. This is important as your doctor will have to ensure that your baby is healthy and growing well. This information is not intended to replace advice given to you by your health care provider. Make sure you discuss any questions you have with your health care provider. Document Revised: 03/31/2019 Document Reviewed: 12/16/2016 Elsevier Patient Education  2020 [employer]  

## 2020-10-17 NOTE — Progress Notes (Signed)
New OB, c/o severe nausea and vomiting, cramping. Declined FLU Vaccine.

## 2020-10-18 DIAGNOSIS — Z87891 Personal history of nicotine dependence: Secondary | ICD-10-CM | POA: Insufficient documentation

## 2020-10-18 LAB — CBC/D/PLT+RPR+RH+ABO+RUB AB...
Antibody Screen: NEGATIVE
Basophils Absolute: 0 10*3/uL (ref 0.0–0.2)
Basos: 0 %
EOS (ABSOLUTE): 0 10*3/uL (ref 0.0–0.4)
Eos: 0 %
HCV Ab: <0.1 s/co ratio (ref 0.0–0.9)
HIV Screen 4th Generation wRfx: NONREACTIVE
Hematocrit: 39.1 % (ref 34.0–46.6)
Hemoglobin: 13.2 g/dL (ref 11.1–15.9)
Hepatitis B Surface Ag: NEGATIVE
Immature Grans (Abs): 0 10*3/uL (ref 0.0–0.1)
Immature Granulocytes: 0 %
Lymphocytes Absolute: 1.6 10*3/uL (ref 0.7–3.1)
Lymphs: 12 %
MCH: 28.8 pg (ref 26.6–33.0)
MCHC: 33.8 g/dL (ref 31.5–35.7)
MCV: 85 fL (ref 79–97)
Monocytes Absolute: 0.6 10*3/uL (ref 0.1–0.9)
Monocytes: 5 %
Neutrophils Absolute: 11 10*3/uL — ABNORMAL HIGH (ref 1.4–7.0)
Neutrophils: 83 %
Platelets: 220 10*3/uL (ref 150–450)
RBC: 4.58 x10E6/uL (ref 3.77–5.28)
RDW: 13.4 % (ref 11.7–15.4)
RPR Ser Ql: NONREACTIVE
Rh Factor: POSITIVE
Rubella Antibodies, IGG: 20.5 index (ref >0.99)
WBC: 13.2 10*3/uL — ABNORMAL HIGH (ref 3.4–10.8)

## 2020-10-18 LAB — CERVICOVAGINAL ANCILLARY ONLY
Bacterial Vaginitis (gardnerella): POSITIVE — AB
Candida Glabrata: NEGATIVE
Candida Vaginitis: POSITIVE — AB
Chlamydia: NEGATIVE
Comment: NEGATIVE
Comment: NEGATIVE
Comment: NEGATIVE
Comment: NEGATIVE
Comment: NEGATIVE
Comment: NORMAL
Neisseria Gonorrhea: NEGATIVE
Trichomonas: NEGATIVE

## 2020-10-18 LAB — HCV INTERPRETATION

## 2020-10-19 LAB — CULTURE, OB URINE

## 2020-10-19 LAB — URINE CULTURE, OB REFLEX

## 2020-10-22 ENCOUNTER — Telehealth: Payer: Self-pay | Admitting: Obstetrics and Gynecology

## 2020-10-22 DIAGNOSIS — B373 Candidiasis of vulva and vagina: Secondary | ICD-10-CM

## 2020-10-22 DIAGNOSIS — B3731 Acute candidiasis of vulva and vagina: Secondary | ICD-10-CM

## 2020-10-22 MED ORDER — MICONAZOLE NITRATE 100 MG VA SUPP: 100.0000 mg | Freq: Every day | VAGINAL | 0 refills | Status: DC

## 2020-10-22 NOTE — Telephone Encounter (Signed)
Unable to contact patient given mailbox full on several occasions. Sent prescription for miconazole to pt's preferred pharmacy given finding of vaginal yeast in pregnancy. Will also send message to Nolon Rod at [facility] to try to follow-up with pt as well.   Sheila Oats, MD OB Fellow, Faculty Practice 10/22/2020 12:19 PM

## 2020-10-23 ENCOUNTER — Encounter: Payer: Self-pay | Admitting: Obstetrics and Gynecology

## 2020-10-25 ENCOUNTER — Other Ambulatory Visit: Payer: Self-pay

## 2020-10-25 ENCOUNTER — Inpatient Hospital Stay (HOSPITAL_COMMUNITY)
Admission: AD | Admit: 2020-10-25 | Discharge: 2020-10-25 | Disposition: A | Discharge status: Home / Self Care | Payer: Medicaid Other | Attending: Obstetrics and Gynecology | Admitting: Obstetrics and Gynecology

## 2020-10-25 DIAGNOSIS — O219 Vomiting of pregnancy, unspecified: Secondary | ICD-10-CM | POA: Diagnosis not present

## 2020-10-25 DIAGNOSIS — F17210 Nicotine dependence, cigarettes, uncomplicated: Secondary | ICD-10-CM | POA: Diagnosis not present

## 2020-10-25 DIAGNOSIS — Z3A12 12 weeks gestation of pregnancy: Secondary | ICD-10-CM

## 2020-10-25 DIAGNOSIS — O209 Hemorrhage in early pregnancy, unspecified: Secondary | ICD-10-CM

## 2020-10-25 DIAGNOSIS — Z6790 Unspecified blood type, Rh positive: Secondary | ICD-10-CM | POA: Diagnosis not present

## 2020-10-25 DIAGNOSIS — O99331 Smoking (tobacco) complicating pregnancy, first trimester: Secondary | ICD-10-CM | POA: Diagnosis not present

## 2020-10-25 NOTE — Discharge Instructions (Signed)

## 2020-10-25 NOTE — MAU Provider Note (Addendum)
History    Chief Complaint  Patient presents with  . Vaginal Bleeding   [name] presents to the MAU after bleeding with clot passage while using the restroom this morning. She reports blood from her vagina while having a bowel movement. Showed provider a picture of what appears to be a clot floating in the toilet water. No cramping or pain in the lower abdomen or vagina. Currently only meds taking are prenatal vitamin, zofran, and phenergan.   Last week, she experienced nausea, vomiting, and epigastric pain. Also reports some nondescript "cold symptoms" at that time. Has not experienced any in one week.   Currently smoking 1-2 cigarettes daily. Was smoking marijuana in recent past but stopped "about a month ago". No alcohol, cocaine, or other illicit substances. Takes no medications not prescribed to her. PMHx depression, anxiety, and PTSD previously on seroquel, fluoxetine, and "another medicine that starts with an O"; she stopped these cold [place] on her own about one month ago despite being counseled by her OB provider that she could continue these. Did so because "I didn't want anything to happen to my baby".    Pertinent Obstetrical History: [id number] currently pregnant with IUP at 12.5w gestation. H/o SAB in 2013 (G2). Three living children delivered by SVD without complication. No history of placenta previa or abruption.    Past Medical History:  Diagnosis Date  . Chlamydia infection   . Trichomonas 01/2012  . UTI (lower urinary tract infection)     Past Surgical History:  Procedure Laterality Date  . NO PAST SURGERIES      Family History  Problem Relation Age of Onset  . Diabetes Maternal Aunt   . Healthy Mother   . Healthy Father   . Alcohol abuse Neg Hx   . Arthritis Neg Hx   . Asthma Neg Hx   . Birth defects Neg Hx   . Cancer Neg Hx   . COPD Neg Hx   . Depression Neg Hx   . Drug abuse Neg Hx   . Early death Neg Hx   . Hearing loss Neg Hx   . Heart disease Neg Hx    . Hyperlipidemia Neg Hx   . Hypertension Neg Hx   . Kidney disease Neg Hx   . Learning disabilities Neg Hx   . Mental illness Neg Hx   . Mental retardation Neg Hx   . Miscarriages / Stillbirths Neg Hx   . Stroke Neg Hx   . Vision loss Neg Hx     Social History   Tobacco Use  . Smoking status: Current Every Day Smoker    Packs/day: 0.50    Types: Cigarettes  . Smokeless tobacco: Never Used  Vaping Use  . Vaping Use: Never used  Substance Use Topics  . Alcohol use: Not Currently  . Drug use: Yes    Types: Marijuana    Comment: Last used early October    Allergies: No Known Allergies  No medications prior to admission.   Review of Systems  Constitutional: Negative for chills and diaphoresis.  HENT: Negative for congestion, postnasal drip and sore throat.   Respiratory: Negative for cough and shortness of breath.   Cardiovascular: Negative for chest pain and palpitations.  Gastrointestinal: Positive for vomiting. Negative for abdominal pain, blood in stool, diarrhea and nausea.  Genitourinary: Positive for vaginal bleeding. Negative for difficulty urinating, dyspareunia, dysuria, pelvic pain, vaginal discharge and vaginal pain.   Physical Exam Blood pressure 111/64, pulse 85, temperature 98.4  F (36.9 C), temperature source Oral, resp. rate 17, weight 65.9 kg, last menstrual period 07/28/2020, SpO2 100 %, unknown if currently breastfeeding. Physical Exam Exam conducted with a chaperone present.  Constitutional:      Appearance: Normal appearance. She is normal weight. She is not ill-appearing or toxic-appearing.  HENT:     Head: Normocephalic.  Pulmonary:     Effort: Pulmonary effort is normal.  Abdominal:     Palpations: Abdomen is soft.  Genitourinary:    General: Normal vulva.     Vagina: Normal.     Cervix: Cervical bleeding (Scant, pink discharge on speculum. None frank or coming from cervix. ) present. No cervical motion tenderness, discharge, friability or  lesion.     Comments: Cervix closed/long Neurological:     Mental Status: She is alert.    Bedside Ultrasound: Single IUP seen with heartbeat and movement. No evidence of placental abruption or previa.  Cervical exam: cervix was closed, thick, and posterior.   MAU Course Procedures  MDM Ms. Guglielmo is a G5P3 currently with IUP at 12.5w gestation here for light vaginal bleeding and passage of large clot while having bowel movement. Scant vaginal bleeding on speculum exam with cervix closed and thick. Bedside ultrasound reassuring, evidence of single live IUP with heartbeat and good movement. Just recently established care with [facility] clinic, has next appointment scheduled 11/14/2020. Discharged home from MAU with return precautions discussed.   A: First trimester bleeding at 12.5w, Rh(+).   P: Discharge home, follow up at [id number] [facility] as scheduled. Strict SAB and bleeding and return precautions given. Pelvic rest. Instructed to complete miconazole suppository as prescribed.   Fayette Pho, MD  CNM attestation:  HPI: [name] Weber is a 24 y.o. [phone] at [redacted]w[redacted]d presenting with VB  ROS, labs, PMH reviewed   PE: Patient Vitals for the past 24 hrs:  BP Temp Temp src Pulse Resp SpO2 Weight  10/25/20 1054 111/64 -- -- 85 17 -- --  10/25/20 1006 126/71 98.4 F (36.9 C) Oral 87 17 100 % --  10/25/20 1000 -- -- -- -- -- -- 65.9 kg   Gen: A&O x3, NAD Resp: normal effort and rate, no distress Heart: regular rate Abd: Soft, NT  FHT: 159 Limited bedside US: viable, active fetus, +cardiac activity, subj. nml AFV  Orders Placed This Encounter  Procedures  . Discharge patient Discharge disposition: 01-Home or Self Care; Discharge patient date: 10/25/2020   No orders of the defined types were placed in this encounter.  MDM No signs of threatened SAB or SCH. Spotting may be result of current yeast infection being treated. Pt reassured. Stable for discharge home.   Assessment: 1.  Vaginal bleeding before [redacted] weeks gestation   2. Blood type, Rh positive   3. [redacted] weeks gestation of pregnancy    Plan: - Discharge home  - SAB precautions - Follow-up as scheduled at [facility] for next prenatal visit or sooner as needed if symptoms worsen - Return to maternity admissions if symptoms worsen  I confirm that I have verified the information documented in the resident's note. I personally was present during the history, physical exam, and medical decision-making activities of this service and have verified that the service and findings are accurately documented in the resident's note.  Donette Larry, CNM 10/25/2020 11:16 AM

## 2020-10-29 ENCOUNTER — Encounter: Payer: Self-pay | Admitting: Obstetrics and Gynecology

## 2020-10-29 DIAGNOSIS — D563 Thalassemia minor: Secondary | ICD-10-CM | POA: Insufficient documentation

## 2020-11-06 ENCOUNTER — Inpatient Hospital Stay (HOSPITAL_COMMUNITY)
Admission: AD | Admit: 2020-11-06 | Discharge: 2020-11-07 | Disposition: A | Discharge status: Home / Self Care | Payer: Medicaid Other | Attending: Obstetrics and Gynecology | Admitting: Obstetrics and Gynecology

## 2020-11-06 ENCOUNTER — Encounter (HOSPITAL_COMMUNITY): Payer: Self-pay | Admitting: Obstetrics and Gynecology

## 2020-11-06 ENCOUNTER — Other Ambulatory Visit: Payer: Self-pay

## 2020-11-06 DIAGNOSIS — F17210 Nicotine dependence, cigarettes, uncomplicated: Secondary | ICD-10-CM | POA: Diagnosis not present

## 2020-11-06 DIAGNOSIS — O26891 Other specified pregnancy related conditions, first trimester: Secondary | ICD-10-CM | POA: Diagnosis not present

## 2020-11-06 DIAGNOSIS — F1290 Cannabis use, unspecified, uncomplicated: Secondary | ICD-10-CM

## 2020-11-06 DIAGNOSIS — O99012 Anemia complicating pregnancy, second trimester: Secondary | ICD-10-CM

## 2020-11-06 DIAGNOSIS — Z3481 Encounter for supervision of other normal pregnancy, first trimester: Secondary | ICD-10-CM

## 2020-11-06 DIAGNOSIS — O99331 Smoking (tobacco) complicating pregnancy, first trimester: Secondary | ICD-10-CM | POA: Insufficient documentation

## 2020-11-06 DIAGNOSIS — R81 Glycosuria: Secondary | ICD-10-CM | POA: Diagnosis not present

## 2020-11-06 DIAGNOSIS — R112 Nausea with vomiting, unspecified: Secondary | ICD-10-CM

## 2020-11-06 DIAGNOSIS — O219 Vomiting of pregnancy, unspecified: Secondary | ICD-10-CM | POA: Insufficient documentation

## 2020-11-06 DIAGNOSIS — D560 Alpha thalassemia: Secondary | ICD-10-CM

## 2020-11-06 DIAGNOSIS — Z3A14 14 weeks gestation of pregnancy: Secondary | ICD-10-CM | POA: Diagnosis not present

## 2020-11-06 DIAGNOSIS — O99321 Drug use complicating pregnancy, first trimester: Secondary | ICD-10-CM | POA: Diagnosis not present

## 2020-11-06 DIAGNOSIS — O99322 Drug use complicating pregnancy, second trimester: Secondary | ICD-10-CM | POA: Diagnosis not present

## 2020-11-06 DIAGNOSIS — Z79899 Other long term (current) drug therapy: Secondary | ICD-10-CM | POA: Insufficient documentation

## 2020-11-06 DIAGNOSIS — F12188 Cannabis abuse with other cannabis-induced disorder: Secondary | ICD-10-CM | POA: Diagnosis not present

## 2020-11-06 DIAGNOSIS — O99891 Other specified diseases and conditions complicating pregnancy: Secondary | ICD-10-CM

## 2020-11-06 DIAGNOSIS — D563 Thalassemia minor: Secondary | ICD-10-CM

## 2020-11-06 LAB — URINALYSIS, ROUTINE W REFLEX MICROSCOPIC
Bacteria, UA: NONE SEEN
Bilirubin Urine: NEGATIVE
Glucose, UA: >=500 mg/dL — AB
Ketones, ur: 80 mg/dL — AB
Leukocytes,Ua: NEGATIVE
Nitrite: NEGATIVE
Protein, ur: 100 mg/dL — AB
Specific Gravity, Urine: 1.025 (ref 1.005–1.030)
pH: 6 (ref 5.0–8.0)

## 2020-11-06 MED ORDER — SCOPOLAMINE 1 MG/3DAYS TD PT72
1.0000 | MEDICATED_PATCH | TRANSDERMAL | Status: DC
  Administered 2020-11-06: 1.5 mg via TRANSDERMAL
  Filled 2020-11-06: qty 1

## 2020-11-06 MED ORDER — PROMETHAZINE HCL 25 MG/ML IJ SOLN
25.0000 mg | Freq: Once | INTRAMUSCULAR | Status: AC
  Administered 2020-11-06: 25 mg via INTRAVENOUS
  Filled 2020-11-06: qty 1

## 2020-11-06 MED ORDER — SUCRALFATE 1 GM/10ML PO SUSP
1.0000 g | Freq: Once | ORAL | Status: AC
  Administered 2020-11-06: 1 g via ORAL
  Filled 2020-11-06: qty 10

## 2020-11-06 MED ORDER — ONDANSETRON 4 MG PO TBDP
8.0000 mg | ORAL_TABLET | Freq: Once | ORAL | Status: AC
  Administered 2020-11-06: 8 mg via ORAL
  Filled 2020-11-06: qty 2

## 2020-11-06 MED ORDER — LACTATED RINGERS IV BOLUS
1000.0000 mL | Freq: Once | INTRAVENOUS | Status: AC
  Administered 2020-11-06: 1000 mL via INTRAVENOUS

## 2020-11-06 MED ORDER — PROMETHAZINE HCL 25 MG RE SUPP: 25.0000 mg | Freq: Once | RECTAL | Status: DC

## 2020-11-06 NOTE — MAU Provider Note (Signed)
Chief Complaint: Emesis  First Provider Initiated Contact with Patient 11/06/20 2118.    SUBJECTIVE HPI: [name] Weber is a 24 y.o. [id number] at 101w3d by LMP who presents to maternity admissions reporting concern of nausea and vomiting since 0600. Pt reports inability to keep down food and fluids since this AM. Previously was doing well with use of po phenergan. Was previously also taking zofran and scopolamine patch and suppositories prn but ran out of these medications several days ago. She also reports resuming use of marijuana, most recently 3 days ago. Next prenatal appt on 11/24.  She denies vaginal bleeding, vaginal itching/burning, urinary symptoms, h/a, dizziness, n/v, or fever/chills.  Past Medical History:  Diagnosis Date  . Chlamydia infection   . Trichomonas 01/2012  . UTI (lower urinary tract infection)    Past Surgical History:  Procedure Laterality Date  . NO PAST SURGERIES     Social History   Socioeconomic History  . Marital status: Single    Spouse name: Not on file  . Number of children: Not on file  . Years of education: Not on file  . Highest education level: Not on file  Occupational History  . Not on file  Tobacco Use  . Smoking status: Current Every Day Smoker    Packs/day: 0.50    Types: Cigarettes  . Smokeless tobacco: Never Used  Vaping Use  . Vaping Use: Never used  Substance and Sexual Activity  . Alcohol use: Not Currently  . Drug use: Yes    Types: Marijuana    Comment: Last used early October  . Sexual activity: Yes    Partners: Male    Birth control/protection: None  Other Topics Concern  . Not on file  Social History Narrative  . Not on file   Social Determinants of Health   Financial Resource Strain:   . Difficulty of Paying Living Expenses: Not on file  Food Insecurity:   . Worried About Charity fundraiser in the Last Year: Not on file  . Ran Out of Food in the Last Year: Not on file  Transportation Needs:   . Lack of  Transportation (Medical): Not on file  . Lack of Transportation (Non-Medical): Not on file  Physical Activity:   . Days of Exercise per Week: Not on file  . Minutes of Exercise per Session: Not on file  Stress:   . Feeling of Stress : Not on file  Social Connections:   . Frequency of Communication with Friends and Family: Not on file  . Frequency of Social Gatherings with Friends and Family: Not on file  . Attends Religious Services: Not on file  . Active Member of Clubs or Organizations: Not on file  . Attends Archivist Meetings: Not on file  . Marital Status: Not on file  Intimate Partner Violence:   . Fear of Current or Ex-Partner: Not on file  . Emotionally Abused: Not on file  . Physically Abused: Not on file  . Sexually Abused: Not on file   No current facility-administered medications on file prior to encounter.   Current Outpatient Medications on File Prior to Encounter  Medication Sig Dispense Refill  . Prenatal Vit-Fe Fumarate-FA (PRENATAL MULTIVITAMIN) TABS tablet Take 1 tablet by mouth daily at 12 noon.    . promethazine (PHENERGAN) 25 MG tablet Take 1 tablet (25 mg total) by mouth every 6 (six) hours as needed for nausea or vomiting. 30 tablet 1  . Blood Pressure Monitoring (BLOOD  PRESSURE KIT) DEVI 1 kit by Does not apply route once a week. 1 each 0  . esomeprazole (NEXIUM) 40 MG capsule Take 1 capsule (40 mg total) by mouth 2 (two) times daily before a meal. (Patient not taking: Reported on 10/10/2020) 60 capsule 3  . FLUoxetine (PROZAC) 20 MG capsule Take 40 mg by mouth daily.     . miconazole (MICOTIN) 100 MG vaginal suppository Place 1 suppository (100 mg total) vaginally at bedtime. 7 suppository 0  . QUEtiapine (SEROQUEL) 50 MG tablet Take 50 mg by mouth at bedtime.     No Known Allergies  I have reviewed patient's Past Medical Hx, Surgical Hx, Family Hx, Social Hx, medications and allergies.   ROS:  Review of Systems  Constitutional: Negative for  chills, fatigue and fever.  HENT: Negative for congestion and sore throat.   Eyes: Negative for photophobia and visual disturbance.  Respiratory: Negative for cough and shortness of breath.   Cardiovascular: Negative for chest pain.  Gastrointestinal: Positive for abdominal pain, constipation, nausea and vomiting. Negative for abdominal distention and diarrhea.  Genitourinary: Negative for dysuria, flank pain, pelvic pain, vaginal bleeding, vaginal discharge and vaginal pain.  Musculoskeletal: Negative for back pain.  Neurological: Negative for weakness, light-headedness and headaches.   Other systems negative.  Physical Exam  Physical Exam Patient Vitals for the past 24 hrs:  BP Temp Pulse Resp Height Weight  11/06/20 2041 137/88 98.5 F (36.9 C) 82 18 5' 3"  (1.6 m) 62.1 kg   Constitutional: Well-developed, well-nourished female in no acute distress.  Cardiovascular: normal rate and rhythm Respiratory: normal effort  GI: Abd soft, non-distended, minimal tenderness to palpation throughout, no rebound or guarding. Pos BS x 4. Negative Murphy's sign. MS: Extremities nontender, no edema, normal ROM. 2-3 second cap refill. Neurologic: Alert and oriented x 4.  GU: Neg CVAT.  PELVIC EXAM: deferred  FHT 162 by doppler.  LAB RESULTS Results for orders placed or performed during the hospital encounter of 11/06/20 (from the past 24 hour(s))  Urinalysis, Routine w reflex microscopic Urine, Clean Catch     Status: Abnormal   Collection Time: 11/06/20  9:02 PM  Result Value Ref Range   Color, Urine AMBER (A) YELLOW   APPearance HAZY (A) CLEAR   Specific Gravity, Urine 1.025 1.005 - 1.030   pH 6.0 5.0 - 8.0   Glucose, UA >=500 (A) NEGATIVE mg/dL   Hgb urine dipstick SMALL (A) NEGATIVE   Bilirubin Urine NEGATIVE NEGATIVE   Ketones, ur 80 (A) NEGATIVE mg/dL   Protein, ur 100 (A) NEGATIVE mg/dL   Nitrite NEGATIVE NEGATIVE   Leukocytes,Ua NEGATIVE NEGATIVE   RBC / HPF 11-20 0 - 5 RBC/hpf    WBC, UA 0-5 0 - 5 WBC/hpf   Bacteria, UA NONE SEEN NONE SEEN   Squamous Epithelial / LPF 0-5 0 - 5   Mucus PRESENT    Hyaline Casts, UA PRESENT   Comprehensive metabolic panel     Status: Abnormal   Collection Time: 11/06/20 11:53 PM  Result Value Ref Range   Sodium 138 135 - 145 mmol/L   Potassium 3.4 (L) 3.5 - 5.1 mmol/L   Chloride 104 98 - 111 mmol/L   CO2 24 22 - 32 mmol/L   Glucose, Bld 109 (H) 70 - 99 mg/dL   BUN 6 6 - 20 mg/dL   Creatinine, Ser 0.65 0.44 - 1.00 mg/dL   Calcium 9.6 8.9 - 10.3 mg/dL   Total Protein 6.6 6.5 - 8.1  g/dL   Albumin 3.5 3.5 - 5.0 g/dL   AST 16 15 - 41 U/L   ALT 12 0 - 44 U/L   Alkaline Phosphatase 33 (L) 38 - 126 U/L   Total Bilirubin 0.6 0.3 - 1.2 mg/dL   GFR, Estimated >60 >60 mL/min   Anion gap 10 5 - 15  Hemoglobin A1c     Status: Abnormal   Collection Time: 11/06/20 11:53 PM  Result Value Ref Range   Hgb A1c MFr Bld 4.2 (L) 4.8 - 5.6 %   Mean Plasma Glucose 73.84 mg/dL  CBC     Status: Abnormal   Collection Time: 11/06/20 11:53 PM  Result Value Ref Range   WBC 17.2 (H) 4.0 - 10.5 K/uL   RBC 4.37 3.87 - 5.11 MIL/uL   Hemoglobin 12.1 12.0 - 15.0 g/dL   HCT 33.9 (L) 36 - 46 %   MCV 77.6 (L) 80.0 - 100.0 fL   MCH 27.7 26.0 - 34.0 pg   MCHC 35.7 30.0 - 36.0 g/dL   RDW 12.7 11.5 - 15.5 %   Platelets 210 150 - 400 K/uL   nRBC 0.0 0.0 - 0.2 %  Lipase, blood     Status: None   Collection Time: 11/06/20 11:53 PM  Result Value Ref Range   Lipase 22 11 - 51 U/L    A/Positive/-- (10/27 1020)  IMAGING US OB Limited  Result Date: 10/11/2020 ----------------------------------------------------------------------  OBSTETRICS REPORT                       (Signed Final 10/11/2020 03:17 pm) ---------------------------------------------------------------------- Patient Info  ID #:       [id number]                          D.O.B.:  [birth year] (23 yrs)  Name:       SHAKALA MARLATT Kirn               Visit Date: 10/10/2020 01:57 pm  ---------------------------------------------------------------------- Performed By  Attending:        Lynnda Shields MD       Ref. Address[facility]      [facility]                                                              [place]  Performed By:     Elyn Peers RN         [facility]                                                              [facility]                                                              [place]  Referred By:      Griffin Basil MD ---------------------------------------------------------------------- Orders  #  Description                           Code        Ordered By  1  US OB LIMITED                         32671.2     Lynnda Shields ----------------------------------------------------------------------  #  Order #                     Accession #                Episode #  1  [id number]                   [id number]                 [id number] ---------------------------------------------------------------------- Indications  [redacted] weeks gestation of pregnancy                 Z3A.10  Pregnancy with inconclusive fetal viability     O36.80X0 ---------------------------------------------------------------------- Fetal Evaluation  Num Of Fetuses:          1  Fetal Heart              163  Rate(bpm):  Cardiac Activity:        Observed  Comment:    10 week IUP, EDD 05/03/2021 ---------------------------------------------------------------------- Biometry  CRL:      40.3  mm     G. Age:  10w 5d                   EDD:   05/03/21 ---------------------------------------------------------------------- Gestational Age  Best:          10w 5d     Det. By:  U/S C R L (10/10/20)     EDD:    05/03/21 ---------------------------------------------------------------------- Comments  Single live IUP at 21w5dby CBuffalo LMP 07/28/20. FHR 165  ----------------------------------------------------------------------                 LLynnda Shields MD Electronically Signed Final Report   10/11/2020 03:17 pm ----------------------------------------------------------------------   MAU Management/MDM: Treatments in MAU included IVF, zofran, phenergan, scopolamine and sucralfate. UA notable for >500 glu, small Hgb, 80 ketones, 100 protein. Labs ordered: CBC, CMP, lipase, A1c.   0143: Reassuringly, CMP with normal glucose, Cr and normal CO2. A1c 4.2%. No concern for DKA. CBC notable for WBC 17.2. Lipase wnl.  0145: Given persistent non-bloody emesis, ordered reglan and protonix. Added TSH to orders.  0305: Pt reports good improvement in symptoms without recurrent vomiting. Pt reports desire to return home at this time. Discussed plan for outpatient medication regimen (scripts for protonix, po reglan, phenergan suppository sent to pt's preferred pharmacy).  ASSESSMENT & PLAN 1. Cannabinoid hyperemesis syndrome: Pt presented to MAU given 1 day of  persistent vomiting without ability to tolerate po fluids in setting of recurrent marijuana use in early pregnancy. No vaginal bleeding or other red flag symptoms. Vital signs stable on admission. Reassuringly abdominal exam. Ultimately, pt had good improvement in symptoms with po reglan and protonix. Symptoms most likely secondary to current early pregnancy in addition to marijuana use. Given elevated WBC, viral gastroenteritis is also possible. May consider TFTs if persistent symptoms. -s/p IVF, zofran, phenergan, scopolamine, sucralfate, reglan & protonix in MAU -plan for reglan 53m TID with meals in addition to daily protonix, scopolamine patch (scripts to pharmacy) -script for phenergan suppository in the event that po meds are not tolerated -counseled on importance of good hydration -plan for f/u on 11/24 with prenatal provider or sooner as needed for worsening n/v, inability to tolerate fluids by  mouth, severe abdominal pain, fever, or other concerning symptoms  2. Alpha thalassemia silent carrier: H&H wnl today.  3. Encounter for supervision of other normal pregnancy, first trimester: No vaginal bleeding or other red flag symptoms. Confirmed IUP at 125w5d-plan to f/u with prenatal provider on 11/24 or sooner as needed for vaginal bleeding, severe abdominal pain or other concerning symptoms  4. Glucosuria with normal serum glucose: reassuringly, normal serum glucose level and A1c 4.2%. No sign of DKA on labs.   Discharge home with strict return precautions as noted above. Pt stable at time of discharge. Follow-up with prenatal provider on 11/14/20. Encouraged to return here or to other Urgent Care/ED if she develops worsening of symptoms, increase in pain, fever, or other concerning symptoms.   GoRanda NgoMD OB Fellow, Faculty Practice 11/07/2020 3:13 AM

## 2020-11-06 NOTE — MAU Note (Signed)
Pt reports she has had bad nausea vomiting since 6 am. Has been taking promethazine but not working today. Reports abd pain and throat burning.

## 2020-11-07 LAB — CBC
HCT: 33.9 % — ABNORMAL LOW (ref 36.0–46.0)
Hemoglobin: 12.1 g/dL (ref 12.0–15.0)
MCH: 27.7 pg (ref 26.0–34.0)
MCHC: 35.7 g/dL (ref 30.0–36.0)
MCV: 77.6 fL — ABNORMAL LOW (ref 80.0–100.0)
Platelets: 210 10*3/uL (ref 150–400)
RBC: 4.37 MIL/uL (ref 3.87–5.11)
RDW: 12.7 % (ref 11.5–15.5)
WBC: 17.2 10*3/uL — ABNORMAL HIGH (ref 4.0–10.5)
nRBC: 0 % (ref 0.0–0.2)

## 2020-11-07 LAB — COMPREHENSIVE METABOLIC PANEL
ALT: 12 U/L (ref 0–44)
AST: 16 U/L (ref 15–41)
Albumin: 3.5 g/dL (ref 3.5–5.0)
Alkaline Phosphatase: 33 U/L — ABNORMAL LOW (ref 38–126)
Anion gap: 10 (ref 5–15)
BUN: 6 mg/dL (ref 6–20)
CO2: 24 mmol/L (ref 22–32)
Calcium: 9.6 mg/dL (ref 8.9–10.3)
Chloride: 104 mmol/L (ref 98–111)
Creatinine, Ser: 0.65 mg/dL (ref 0.44–1.00)
GFR, Estimated: >60 mL/min (ref >60)
Glucose, Bld: 109 mg/dL — ABNORMAL HIGH (ref 70–99)
Potassium: 3.4 mmol/L — ABNORMAL LOW (ref 3.5–5.1)
Sodium: 138 mmol/L (ref 135–145)
Total Bilirubin: 0.6 mg/dL (ref 0.3–1.2)
Total Protein: 6.6 g/dL (ref 6.5–8.1)

## 2020-11-07 LAB — HEMOGLOBIN A1C
Hgb A1c MFr Bld: 4.2 % — ABNORMAL LOW (ref 4.8–5.6)
Mean Plasma Glucose: 73.84 mg/dL

## 2020-11-07 LAB — LIPASE, BLOOD: Lipase: 22 U/L (ref 11–51)

## 2020-11-07 MED ORDER — METOCLOPRAMIDE HCL 5 MG/5ML PO SOLN
10.0000 mg | Freq: Once | ORAL | Status: DC
  Filled 2020-11-07: qty 10

## 2020-11-07 MED ORDER — METOCLOPRAMIDE HCL 10 MG PO TABS
10.0000 mg | ORAL_TABLET | Freq: Once | ORAL | Status: AC
  Administered 2020-11-07: 10 mg via ORAL
  Filled 2020-11-07: qty 1

## 2020-11-07 MED ORDER — PANTOPRAZOLE SODIUM 20 MG PO TBEC: 20.0000 mg | DELAYED_RELEASE_TABLET | Freq: Every day | ORAL | 1 refills | Status: DC

## 2020-11-07 MED ORDER — PROMETHAZINE HCL 25 MG RE SUPP: 25.0000 mg | Freq: Four times a day (QID) | RECTAL | 1 refills | Status: DC | PRN

## 2020-11-07 MED ORDER — ONDANSETRON 4 MG PO TBDP: 4.0000 mg | ORAL_TABLET | Freq: Three times a day (TID) | ORAL | 0 refills | Status: DC | PRN

## 2020-11-07 MED ORDER — METOCLOPRAMIDE HCL 10 MG PO TABS: 10.0000 mg | ORAL_TABLET | Freq: Three times a day (TID) | ORAL | 1 refills | Status: DC | PRN

## 2020-11-07 MED ORDER — SCOPOLAMINE 1 MG/3DAYS TD PT72: 1.0000 | MEDICATED_PATCH | TRANSDERMAL | 0 refills | Status: DC

## 2020-11-07 MED ORDER — PANTOPRAZOLE SODIUM 40 MG PO TBEC
40.0000 mg | DELAYED_RELEASE_TABLET | Freq: Once | ORAL | Status: DC
  Filled 2020-11-07: qty 1

## 2020-11-07 NOTE — Discharge Instructions (Signed)
Be sure to keep your next prenatal appointment on Nov 24th. Please call sooner if concern of inability to tolerate fluids by mouth, worsening nausea/vomiting despite medications, fever or other concerns. Refills of anti-nausea medications have been sent to your pharmacy. Take reglan 3 times daily with meals in addition to protonix daily.   Morning Sickness  Morning sickness is when you feel sick to your stomach (nauseous) during pregnancy. You may feel sick to your stomach and throw up (vomit). You may feel sick in the morning, but you can feel this way at any time of day. Some women feel very sick to their stomach and cannot stop throwing up (hyperemesis gravidarum). Follow these instructions at home: Medicines  Take over-the-counter and prescription medicines only as told by your doctor. Do not take any medicines until you talk with your doctor about them first.  Taking multivitamins before getting pregnant can stop or lessen the harshness of morning sickness. Eating and drinking  Eat dry toast or crackers before getting out of bed.  Eat 5 or 6 small meals a day.  Eat dry and bland foods like rice and baked potatoes.  Do not eat greasy, fatty, or spicy foods.  Have someone cook for you if the smell of food causes you to feel sick or throw up.  If you feel sick to your stomach after taking prenatal vitamins, take them at night or with a snack.  Eat protein when you need a snack. Nuts, yogurt, and cheese are good choices.  Drink fluids throughout the day.  Try ginger ale made with real ginger, ginger tea made from fresh grated ginger, or ginger candies. General instructions  Do not use any products that have nicotine or tobacco in them, such as cigarettes and e-cigarettes. If you need help quitting, ask your doctor.  Use an air purifier to keep the air in your house free of smells.  Get lots of fresh air.  Try to avoid smells that make you feel sick.  Try: ? Wearing a  bracelet that is used for seasickness (acupressure wristband). ? Going to a doctor who puts thin needles into certain body points (acupuncture) to improve how you feel. Contact a doctor if:  You need medicine to feel better.  You feel dizzy or light-headed.  You are losing weight. Get help right away if:  You feel very sick to your stomach and cannot stop throwing up.  You pass out (faint).  You have very bad pain in your belly. Summary  Morning sickness is when you feel sick to your stomach (nauseous) during pregnancy.  You may feel sick in the morning, but you can feel this way at any time of day.  Making some changes to what you eat may help your symptoms go away. This information is not intended to replace advice given to you by your health care provider. Make sure you discuss any questions you have with your health care provider. Document Revised: 11/20/2017 Document Reviewed: 01/08/2017 Elsevier Patient Education  2020 [employer].

## 2020-11-14 ENCOUNTER — Other Ambulatory Visit: Payer: Self-pay

## 2020-11-14 ENCOUNTER — Ambulatory Visit (INDEPENDENT_AMBULATORY_CARE_PROVIDER_SITE_OTHER): Payer: Medicaid Other | Admitting: Nurse Practitioner

## 2020-11-14 ENCOUNTER — Encounter: Payer: Self-pay | Admitting: Nurse Practitioner

## 2020-11-14 VITALS — BP 116/75 | HR 85 | Wt 140.6 lb

## 2020-11-14 DIAGNOSIS — Z3481 Encounter for supervision of other normal pregnancy, first trimester: Secondary | ICD-10-CM

## 2020-11-14 DIAGNOSIS — Z3A15 15 weeks gestation of pregnancy: Secondary | ICD-10-CM

## 2020-11-14 DIAGNOSIS — K5901 Slow transit constipation: Secondary | ICD-10-CM

## 2020-11-14 DIAGNOSIS — R112 Nausea with vomiting, unspecified: Secondary | ICD-10-CM

## 2020-11-14 MED ORDER — DOCUSATE CALCIUM 240 MG PO CAPS: 240.0000 mg | ORAL_CAPSULE | Freq: Two times a day (BID) | ORAL | 2 refills | Status: DC

## 2020-11-14 NOTE — Patient Instructions (Addendum)
Morning Sickness  Morning sickness is when you feel sick to your stomach (nauseous) during pregnancy. You may feel sick to your stomach and throw up (vomit). You may feel sick in the morning, but you can feel this way at any time of day. Some women feel very sick to their stomach and cannot stop throwing up (hyperemesis gravidarum). Follow these instructions at home: Medicines  Take over-the-counter and prescription medicines only as told by your doctor. Do not take any medicines until you talk with your doctor about them first.  Taking multivitamins before getting pregnant can stop or lessen the harshness of morning sickness. Eating and drinking  Eat dry toast or crackers before getting out of bed.  Eat 5 or 6 small meals a day.  Eat dry and bland foods like rice and baked potatoes.  Do not eat greasy, fatty, or spicy foods.  Have someone cook for you if the smell of food causes you to feel sick or throw up.  If you feel sick to your stomach after taking prenatal vitamins, take them at night or with a snack.  Eat protein when you need a snack. Nuts, yogurt, and cheese are good choices.  Drink fluids throughout the day.  Try ginger ale made with real ginger, ginger tea made from fresh grated ginger, or ginger candies. General instructions  Do not use any products that have nicotine or tobacco in them, such as cigarettes and e-cigarettes. If you need help quitting, ask your doctor.  Use an air purifier to keep the air in your house free of smells.  Get lots of fresh air.  Try to avoid smells that make you feel sick.  Try: ? Wearing a bracelet that is used for seasickness (acupressure wristband). ? Going to a doctor who puts thin needles into certain body points (acupuncture) to improve how you feel. Contact a doctor if:  You need medicine to feel better.  You feel dizzy or light-headed.  You are losing weight. Get help right away if:  You feel very sick to your  stomach and cannot stop throwing up.  You pass out (faint).  You have very bad pain in your belly. Summary  Morning sickness is when you feel sick to your stomach (nauseous) during pregnancy.  You may feel sick in the morning, but you can feel this way at any time of day.  Making some changes to what you eat may help your symptoms go away. This information is not intended to replace advice given to you by your health care provider. Make sure you discuss any questions you have with your health care provider. Document Revised: 11/20/2017 Document Reviewed: 01/08/2017 Elsevier Patient Education  2020 [employer].  Constipation, Adult Constipation is when a person:  Poops (has a bowel movement) fewer times in a week than normal.  Has a hard time pooping.  Has poop that is dry, hard, or bigger than normal. Follow these instructions at home: Eating and drinking   Eat foods that have a lot of fiber, such as: ? Fresh fruits and vegetables. ? Whole grains. ? Beans.  Eat less of foods that are high in fat, low in fiber, or overly processed, such as: ? [place] fries. ? Hamburgers. ? Cookies. ? Candy. ? Soda.  Drink enough fluid to keep your pee (urine) clear or pale yellow. General instructions  Exercise regularly or as told by your doctor.  Go to the restroom when you feel like you need to poop. Do not  Do not hold it in.  Take over-the-counter and prescription medicines only as told by your doctor. These include any fiber supplements.  Do pelvic floor retraining exercises, such as: ? Doing deep breathing while relaxing your lower belly (abdomen). ? Relaxing your pelvic floor while pooping.  Watch your condition for any changes.  Keep all follow-up visits as told by your doctor. This is important. Contact a doctor if:  You have pain that gets worse.  You have a fever.  You have not pooped for 4 days.  You throw up (vomit).  You are not hungry.  You lose  weight.  You are bleeding from the anus.  You have thin, pencil-like poop (stool). Get help right away if:  You have a fever, and your symptoms suddenly get worse.  You leak poop or have blood in your poop.  Your belly feels hard or bigger than normal (is bloated).  You have very bad belly pain.  You feel dizzy or you faint. This information is not intended to replace advice given to you by your health care provider. Make sure you discuss any questions you have with your health care provider. Document Revised: 11/20/2017 Document Reviewed: 05/28/2016 Elsevier Patient Education  2020 Elsevier Inc.   

## 2020-11-14 NOTE — Progress Notes (Signed)
    Subjective:  [name] Weber is a 24 y.o. [id number] at [redacted]w[redacted]d being seen today for ongoing prenatal care.  She is currently monitored for the following issues for this high-risk pregnancy and has Sickle cell trait (HCC); Intractable nausea and vomiting; Hypokalemia; Cannabinoid hyperemesis syndrome; Alcohol abuse; Leukocytosis; Encounter for supervision of other normal pregnancy, first trimester; Former tobacco use; and Alpha thalassemia silent carrier on their problem list.  Patient reports vomiting.  Contractions: Not present. Vag. Bleeding: None.   . Denies leaking of fluid.   The following portions of the patient's history were reviewed and updated as appropriate: allergies, current medications, past family history, past medical history, past social history, past surgical history and problem list. Problem list updated.  Objective:   Vitals:   11/14/20 1005  BP: 116/75  Pulse: 85  Weight: 140 lb 9.6 oz (63.8 kg)    Fetal Status: Fetal Heart Rate (bpm): 152 Fundal Height: 14 cm       General:  Alert, oriented and cooperative. Patient is in no acute distress.  Skin: Skin is warm and dry. No rash noted.   Cardiovascular: Normal heart rate noted  Respiratory: Normal respiratory effort, no problems with respiration noted  Abdomen: Soft, gravid, appropriate for gestational age. Pain/Pressure: Absent     Pelvic:  Cervical exam deferred        Extremities: Normal range of motion.  Edema: None  Mental Status: Normal mood and affect. Normal behavior. Normal judgment and thought content.   Urinalysis:      Assessment and Plan:  Pregnancy: [id number] at [redacted]w[redacted]d  1. Encounter for supervision of other normal pregnancy, first trimester Not feeling movement yet which is expected. Seen in MAU as she ran out of medication for vomiting.  Now has refills on prescriptions - had started smoking marijuana again - advised no marijuana  - AFP, Serum, Open Spina Bifida - Babyscripts Schedule  Optimization  2. Intractable nausea and vomiting Doing well with Reglan, Zofran and Phenergan Vomiting much less Likes scopolamine patch but irritates her skin - hyperpigmented, dry, flaking and itching. Advised to use hydrocortisone cream to the area after removing the patch.  May use the patch some but is reluctant due to the skin irritation.  3. Slow transit constipation Likely worsened by zofran - prescribed stool softener    Preterm labor symptoms and general obstetric precautions including but not limited to vaginal bleeding, contractions, leaking of fluid and fetal movement were reviewed in detail with the patient. Please refer to After Visit Summary for other counseling recommendations.  Return in about 4 weeks (around 12/12/2020) for in person ROB.  Nolene Bernheim, RN, MSN, NP-BC Nurse Practitioner, [facility] for [employer], [facility] 11/14/2020 1:14 PM

## 2020-11-14 NOTE — Progress Notes (Signed)
Pt states that she is notfeeling fetal movement yet, denies pain but reports constipation. Pt states that scop patch caused irritation to her skin.

## 2020-11-17 LAB — AFP, SERUM, OPEN SPINA BIFIDA
AFP MoM: 1.04
AFP Value: 35.4 ng/mL
Gest. Age on Collection Date: 15 weeks
Maternal Age At EDD: 24.4 yr
OSBR Risk 1 IN: 10000
Test Results:: NEGATIVE
Weight: 141 [lb_av]

## 2020-11-28 ENCOUNTER — Telehealth: Payer: Self-pay | Admitting: *Deleted

## 2020-11-28 NOTE — Telephone Encounter (Signed)
Patient left message for nurse to call regarding lab result from last visit. Patient verified DOB. Patient was last seen on 11/14/20, AFP completed. Result-negative.   Clovis Pu, RN

## 2020-12-10 ENCOUNTER — Other Ambulatory Visit: Payer: Self-pay | Admitting: *Deleted

## 2020-12-10 ENCOUNTER — Other Ambulatory Visit: Payer: Self-pay | Admitting: Obstetrics and Gynecology

## 2020-12-10 ENCOUNTER — Ambulatory Visit: Payer: Medicaid Other | Attending: Obstetrics and Gynecology

## 2020-12-10 ENCOUNTER — Other Ambulatory Visit: Payer: Self-pay

## 2020-12-10 DIAGNOSIS — Z862 Personal history of diseases of the blood and blood-forming organs and certain disorders involving the immune mechanism: Secondary | ICD-10-CM | POA: Diagnosis not present

## 2020-12-10 DIAGNOSIS — D563 Thalassemia minor: Secondary | ICD-10-CM

## 2020-12-10 DIAGNOSIS — Z363 Encounter for antenatal screening for malformations: Secondary | ICD-10-CM

## 2020-12-10 DIAGNOSIS — Z3A19 19 weeks gestation of pregnancy: Secondary | ICD-10-CM | POA: Diagnosis not present

## 2020-12-10 DIAGNOSIS — Z3481 Encounter for supervision of other normal pregnancy, first trimester: Secondary | ICD-10-CM | POA: Insufficient documentation

## 2020-12-12 ENCOUNTER — Other Ambulatory Visit: Payer: Self-pay

## 2020-12-12 ENCOUNTER — Ambulatory Visit (INDEPENDENT_AMBULATORY_CARE_PROVIDER_SITE_OTHER): Payer: Medicaid Other | Admitting: Advanced Practice Midwife

## 2020-12-12 ENCOUNTER — Encounter: Payer: Self-pay | Admitting: Advanced Practice Midwife

## 2020-12-12 VITALS — BP 111/68 | HR 103 | Wt 138.4 lb

## 2020-12-12 DIAGNOSIS — Z3A19 19 weeks gestation of pregnancy: Secondary | ICD-10-CM

## 2020-12-12 DIAGNOSIS — Z3481 Encounter for supervision of other normal pregnancy, first trimester: Secondary | ICD-10-CM

## 2020-12-12 MED ORDER — ONDANSETRON 8 MG PO TBDP: 8.0000 mg | ORAL_TABLET | Freq: Three times a day (TID) | ORAL | 3 refills | Status: DC | PRN

## 2020-12-12 MED ORDER — PROMETHAZINE HCL 25 MG PO TABS
12.5000 mg | ORAL_TABLET | Freq: Four times a day (QID) | ORAL | 3 refills | Status: DC | PRN
Start: 2020-12-12 — End: 2021-04-14

## 2020-12-12 MED ORDER — PROMETHAZINE HCL 12.5 MG RE SUPP: 12.5000 mg | Freq: Four times a day (QID) | RECTAL | 3 refills | Status: DC | PRN

## 2020-12-12 NOTE — Progress Notes (Signed)
Pt reports fetal flutters with occasional irritability and sharp pains. Pt states that she has lost weight due to N&V, wants refills on zofran and phenergan suppositories.

## 2020-12-12 NOTE — Progress Notes (Signed)
   PRENATAL VISIT NOTE  Subjective:  [name] Weber is a 24 y.o. [id number] at [redacted]w[redacted]d being seen today for ongoing prenatal care.  She is currently monitored for the following issues for this low-risk pregnancy and has Sickle cell trait (HCC); Intractable nausea and vomiting; Hypokalemia; Cannabinoid hyperemesis syndrome; Alcohol abuse; Leukocytosis; Encounter for supervision of other normal pregnancy, first trimester; Former tobacco use; and Alpha thalassemia silent carrier on their problem list.  Patient reports no complaints.  Contractions: Irritability. Vag. Bleeding: None.  Movement: Present. Denies leaking of fluid.   The following portions of the patient's history were reviewed and updated as appropriate: allergies, current medications, past family history, past medical history, past social history, past surgical history and problem list.   Objective:   Vitals:   12/12/20 0946  BP: 111/68  Pulse: (!) 103  Weight: 138 lb 6.4 oz (62.8 kg)    Fetal Status: Fetal Heart Rate (bpm): 160 Fundal Height: 19 cm Movement: Present     General:  Alert, oriented and cooperative. Patient is in no acute distress.  Skin: Skin is warm and dry. No rash noted.   Cardiovascular: Normal heart rate noted  Respiratory: Normal respiratory effort, no problems with respiration noted  Abdomen: Soft, gravid, appropriate for gestational age.  Pain/Pressure: Absent     Pelvic: Cervical exam deferred        Extremities: Normal range of motion.  Edema: None  Mental Status: Normal mood and affect. Normal behavior. Normal judgment and thought content.   Assessment and Plan:  Pregnancy: [id number] at [redacted]w[redacted]d 1. Encounter for supervision of other normal pregnancy, first trimester - refills for zofran and phenergan sent in for patient today   2. [redacted] weeks gestation of pregnancy   Preterm labor symptoms and general obstetric precautions including but not limited to vaginal bleeding, contractions, leaking of fluid and  fetal movement were reviewed in detail with the patient. Please refer to After Visit Summary for other counseling recommendations.   Return in about 4 weeks (around 01/09/2021) for virtual visit .  Future Appointments  Date Time Provider Department Center  01/07/2021  9:30 AM [facility] NURSE [facility]  01/07/2021  9:45 AM [facility] US5 [facility]  01/09/2021  9:35 AM Burleson, Brand Males, NP [facility] None    Thressa Sheller DNP, CNM  12/12/20  10:38 AM

## 2020-12-22 NOTE — L&D Delivery Note (Addendum)
Delivery Note Called to bedside and patient complete and involuntarily pushing. Bulging bag noted, AROM with clear fluid. At 1:17 AM a viable female was delivered via Vaginal, Spontaneous (Presentation: Left Occiput Anterior).  APGAR: 8, 9; weight pending. No nuchal cord noted.   Placenta status: Spontaneous, Intact.  Cord: 3 vessels with the following complications: None.   Anesthesia: None Episiotomy: None Lacerations: None Est. Blood Loss (mL): 100  Mom to postpartum.  Baby to Couplet care / Skin to Skin.  Alric Seton 04/12/2021, 1:48 AM

## 2021-01-07 ENCOUNTER — Ambulatory Visit: Payer: Medicaid Other

## 2021-01-09 ENCOUNTER — Telehealth (INDEPENDENT_AMBULATORY_CARE_PROVIDER_SITE_OTHER): Payer: Medicaid Other | Admitting: Nurse Practitioner

## 2021-01-09 ENCOUNTER — Encounter: Payer: Self-pay | Admitting: Nurse Practitioner

## 2021-01-09 VITALS — BP 133/82 | HR 85

## 2021-01-09 DIAGNOSIS — F1290 Cannabis use, unspecified, uncomplicated: Secondary | ICD-10-CM

## 2021-01-09 DIAGNOSIS — D573 Sickle-cell trait: Secondary | ICD-10-CM

## 2021-01-09 DIAGNOSIS — R112 Nausea with vomiting, unspecified: Secondary | ICD-10-CM

## 2021-01-09 DIAGNOSIS — Z3481 Encounter for supervision of other normal pregnancy, first trimester: Secondary | ICD-10-CM

## 2021-01-09 DIAGNOSIS — D563 Thalassemia minor: Secondary | ICD-10-CM

## 2021-01-09 DIAGNOSIS — O99322 Drug use complicating pregnancy, second trimester: Secondary | ICD-10-CM

## 2021-01-09 DIAGNOSIS — Z3A23 23 weeks gestation of pregnancy: Secondary | ICD-10-CM

## 2021-01-09 DIAGNOSIS — O99012 Anemia complicating pregnancy, second trimester: Secondary | ICD-10-CM

## 2021-01-09 DIAGNOSIS — F12188 Cannabis abuse with other cannabis-induced disorder: Secondary | ICD-10-CM

## 2021-01-09 NOTE — Progress Notes (Signed)
OBSTETRICS PRENATAL VIRTUAL VISIT ENCOUNTER NOTE  [facility]   I connected with [name] ArenasQuintasia L Weber on 01/09/21 at  9:35 AM EST by MyChart Video Encounter at home and verified that I am speaking with the correct person using two identifiers.   I discussed the limitations, risks, security and privacy concerns of performing an evaluation and management service virtually and the availability of in person appointments. I also discussed with the patient that there may be a patient responsible charge related to this service. The patient expressed understanding and agreed to proceed. Subjective:  [name] Weber is a 25 y.o. [phone] at 3436w4d being seen today for ongoing prenatal care.  She is currently monitored for the following issues for this low-risk pregnancy and has Sickle cell trait (HCC); Intractable nausea and vomiting; Hypokalemia; Cannabinoid hyperemesis syndrome; Alcohol abuse; Leukocytosis; Encounter for supervision of other normal pregnancy, first trimester; Former tobacco use; and Alpha thalassemia silent carrier on their problem list.  Patient reports no complaints.  Contractions: Not present. Vag. Bleeding: None.  Movement: Present. Denies any leaking of fluid.   The following portions of the patient's history were reviewed and updated as appropriate: allergies, current medications, past family history, past medical history, past social history, past surgical history and problem list.   Objective:   Vitals:   01/09/21 0937  BP: 133/82  Pulse: 85    Fetal Status:     Movement: Present     General:  Alert, oriented and cooperative. Patient is in no acute distress.  Respiratory: Normal respiratory effort, no problems with respiration noted  Mental Status: Normal mood and affect. Normal behavior. Normal judgment and thought content.  Rest of physical exam deferred due to type of encounter  Imaging: US MFM OB DETAIL +14  WK  Result Date: 12/10/2020 ----------------------------------------------------------------------  OBSTETRICS REPORT                       (Signed Final 12/10/2020 12:30 pm) ---------------------------------------------------------------------- Patient Info  ID #:       [id number]                          D.O.B.:  [birth year] (24 yrs)  Name:       [name] MimesQUINTASIA L Weber               Visit Date: 12/10/2020 11:50 am ---------------------------------------------------------------------- Performed By  Attending:        Ma RingsVictor Fang MD         Ref. Address:     [facility]  Performed By:     Emeline DarlingKasie E Kiser BS,      Location:         [facility]                    [facility]                                     [facility]  Referred By:      Warden Fillers MD ---------------------------------------------------------------------- Orders  #  Description                           Code        Ordered By  1  [place] MFM OB DETAIL +14 WK               76811.01    ANNA GOSWICK ----------------------------------------------------------------------  #  Order #                     Accession #                Episode #  1  [id number]                   [id number]                 [id number] ---------------------------------------------------------------------- Indications  Fetal abnormality - other known or suspected   O35.9XX0  [redacted] weeks gestation of pregnancy                Z3A.19  Encounter for antenatal screening for          Z36.3  malformations  History of sickle cell trait                   Z86.2  Genetic carrier (Alpha Thalassemia Silent      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 Cord Insertion:      Visualized  Amniotic Fluid  AFI FV:      Within normal limits                              Largest Pocket(cm)  5.16 ---------------------------------------------------------------------- Biometry  BPD:      43.6  mm     G. Age:  19w 1d         40  %    CI:        71.72   %    70 - 86                                                          FL/HC:      19.6   %    16.1 - 18.3  HC:      163.9  mm     G. Age:  19w 1d         28  %    HC/AC:      1.24        1.09 - 1.39  AC:      132.5  mm     G. Age:  18w 5d         24  %    FL/BPD:     73.6   %  FL:       32.1  mm     G. Age:  20w 0d         64  %    FL/AC:      24.2   %    20 - 24  HUM:      30.7  mm     G. Age:  20w 1d         71  %  Est. FW:     286  gm    0 lb 10 oz      39  % ---------------------------------------------------------------------- Gestational Age  U/S Today:     19w 2d                                        EDD:   05/04/21  Best:          19w 3d     Det. By:  U/S C R L  (10/10/20)    EDD:   05/03/21 ---------------------------------------------------------------------- Anatomy  Cranium:               Appears normal         Aortic Arch:            Appears normal  Cavum:                 Appears normal         Ductal Arch:            Appears normal  Ventricles:            Appears normal         Diaphragm:              Appears normal  Choroid Plexus:        Appears normal         Stomach:                Appears normal, left  sided  Cerebellum:            Appears normal         Abdomen:                Appears normal  Posterior Fossa:       Appears normal         Abdominal Wall:          Appears nml (cord                                                                        insert, abd wall)  Nuchal Fold:           Appears normal         Cord Vessels:           Appears normal (3                                                                        vessel cord)  Face:                  Not well visualized    Kidneys:                Appear normal  Lips:                  Not well visualized    Bladder:                Appears normal  Thoracic:              Appears normal         Spine:                  Appears normal  Heart:                 Not well visualized    Upper Extremities:      Appears normal  RVOT:                  Not well visualized    Lower Extremities:      Appears normal  LVOT:                  Not well visualized  Other:  Fetus appears to be female. Heels visualized. Hands not well          visualized. Technically difficult due to fetal position. ---------------------------------------------------------------------- Cervix Uterus Adnexa  Cervix  Length:              3  cm.  Normal appearance by transabdominal scan.  Adnexa  No abnormality visualized. ---------------------------------------------------------------------- Comments  This patient was seen for a detailed fetal anatomy scan.  She denies any significant past medical history and denies  any problems in her current pregnancy.  She had a cell free DNA test earlier in her pregnancy which  indicated a low risk for trisomy 3721, 7518, and 13. A  female fetus  is predicted.  She was informed that the fetal growth and amniotic fluid  level were appropriate for her gestational age.  There were no obvious fetal anomalies noted on today's  ultrasound exam.  However, the views of the fetal anatomy  were limited today due to the fetal position.  The patient was informed that anomalies may be missed due  to technical limitations. If the fetus is in a suboptimal position  or maternal habitus is increased, visualization of the fetus in  the  maternal uterus may be impaired.  A follow-up exam was scheduled in 4 weeks to complete the  views of the fetal anatomy. ----------------------------------------------------------------------                   Ma Rings, MD Electronically Signed Final Report   12/10/2020 12:30 pm ----------------------------------------------------------------------   Assessment and Plan:  Pregnancy: [id number] at 110w4d 1. Encounter for supervision of other normal pregnancy, first trimester Considering IUD for contraception - needs info at next visit Advised to take BP weekly and enter into babyscripts Advised about next visit to be NPO after midnight  2. Alpha thalassemia silent carrier   3. Cannabinoid hyperemesis syndrome Vomiting has improved. Sometimes uses phenergan suppository and follows hours later with Zofran if still having nausea  4. Sickle cell trait (HCC) Needs urine culture at next visit   Preterm labor symptoms and general obstetric precautions including but not limited to vaginal bleeding, contractions, leaking of fluid and fetal movement were reviewed in detail with the patient. I discussed the assessment and treatment plan with the patient. The patient was provided an opportunity to ask questions and all were answered. The patient agreed with the plan and demonstrated an understanding of the instructions. The patient was advised to call back or seek an in-person office evaluation/go to [facility] at [facility] for any urgent or concerning symptoms. Please refer to After Visit Summary for other counseling recommendations.   I provided 5 minutes of face-to-face time during this encounter.  No follow-ups on file.  Future Appointments  Date Time Provider Department Center  01/15/2021  7:15 AM [facility] NURSE [facility]  01/15/2021  7:30 AM [facility] US3 [facility]    Currie Paris, NP Center for [employer], [facility]

## 2021-01-09 NOTE — Progress Notes (Signed)
I connected with  [name] Weber on 01/09/21 by a video enabled telemedicine application and verified that I am speaking with the correct person using two identifiers.   I discussed the limitations of evaluation and management by telemedicine. The patient expressed understanding and agreed to proceed.  MyChart OB, reports no problems today.

## 2021-01-15 ENCOUNTER — Ambulatory Visit (HOSPITAL_BASED_OUTPATIENT_CLINIC_OR_DEPARTMENT_OTHER): Payer: Medicaid Other

## 2021-01-15 ENCOUNTER — Ambulatory Visit: Payer: Medicaid Other | Attending: Obstetrics and Gynecology | Admitting: *Deleted

## 2021-01-15 ENCOUNTER — Encounter: Payer: Self-pay | Admitting: *Deleted

## 2021-01-15 ENCOUNTER — Ambulatory Visit: Payer: Medicaid Other

## 2021-01-15 ENCOUNTER — Other Ambulatory Visit: Payer: Self-pay

## 2021-01-15 DIAGNOSIS — Z862 Personal history of diseases of the blood and blood-forming organs and certain disorders involving the immune mechanism: Secondary | ICD-10-CM

## 2021-01-15 DIAGNOSIS — Z3481 Encounter for supervision of other normal pregnancy, first trimester: Secondary | ICD-10-CM

## 2021-01-15 DIAGNOSIS — Z363 Encounter for antenatal screening for malformations: Secondary | ICD-10-CM

## 2021-01-15 DIAGNOSIS — D563 Thalassemia minor: Secondary | ICD-10-CM | POA: Diagnosis present

## 2021-01-15 DIAGNOSIS — O99830 Other infection carrier state complicating pregnancy: Secondary | ICD-10-CM | POA: Insufficient documentation

## 2021-01-15 DIAGNOSIS — Z3A24 24 weeks gestation of pregnancy: Secondary | ICD-10-CM

## 2021-01-15 DIAGNOSIS — O365920 Maternal care for other known or suspected poor fetal growth, second trimester, not applicable or unspecified: Secondary | ICD-10-CM | POA: Diagnosis not present

## 2021-01-15 DIAGNOSIS — Z148 Genetic carrier of other disease: Secondary | ICD-10-CM

## 2021-01-16 ENCOUNTER — Other Ambulatory Visit: Payer: Self-pay | Admitting: *Deleted

## 2021-02-06 ENCOUNTER — Other Ambulatory Visit: Payer: Self-pay

## 2021-02-06 ENCOUNTER — Other Ambulatory Visit: Payer: Medicaid Other

## 2021-02-06 ENCOUNTER — Ambulatory Visit (INDEPENDENT_AMBULATORY_CARE_PROVIDER_SITE_OTHER): Payer: Medicaid Other

## 2021-02-06 VITALS — BP 112/68 | HR 89 | Wt 139.0 lb

## 2021-02-06 DIAGNOSIS — Z23 Encounter for immunization: Secondary | ICD-10-CM | POA: Diagnosis not present

## 2021-02-06 DIAGNOSIS — O219 Vomiting of pregnancy, unspecified: Secondary | ICD-10-CM

## 2021-02-06 DIAGNOSIS — Z3A27 27 weeks gestation of pregnancy: Secondary | ICD-10-CM

## 2021-02-06 DIAGNOSIS — Z3481 Encounter for supervision of other normal pregnancy, first trimester: Secondary | ICD-10-CM

## 2021-02-06 NOTE — Progress Notes (Signed)
ROB/GTT.  TDAP and FLU vaccines given, tolerated well.

## 2021-02-06 NOTE — Progress Notes (Signed)
   LOW-RISK PREGNANCY OFFICE VISIT  Patient name: [name] Weber MRN [id number]  Date of birth: 07-24-1996 Chief Complaint:   Routine Prenatal Visit  Subjective:   [name] Weber is a 25 y.o. [id number] female at [redacted]w[redacted]d with an Estimated Date of Delivery: 05/04/21 being seen today for ongoing management of a low-risk pregnancy aeb has Sickle cell trait (HCC); Intractable nausea and vomiting; Hypokalemia; Cannabinoid hyperemesis syndrome; Alcohol abuse; Leukocytosis; Encounter for supervision of other normal pregnancy, first trimester; Former tobacco use; and Alpha thalassemia silent carrier on their problem list.  Patient presents today with complaint of nausea.  She states she experiences the nausea particularly in the morning right after waking up. Patient endorses fetal movement. Patient denies vaginal concerns including abnormal discharge, leaking of fluid, and bleeding.  Contractions: Irritability. Vag. Bleeding: None.  Movement: Present.  Reviewed past medical,surgical, social, obstetrical and family history as well as problem list, medications and allergies.  Objective   Vitals:   02/06/21 0854  BP: 112/68  Pulse: 89  Weight: 139 lb (63 kg)  Body mass index is 24.62 kg/m.  Total Weight Gain:1 lb (0.454 kg)         Physical Examination:   General appearance: Well appearing, and in no distress  Mental status: Alert, oriented to person, place, and time  Skin: Warm & dry  Cardiovascular: Normal heart rate noted  Respiratory: Normal respiratory effort, no distress  Abdomen: Soft, gravid, nontender, AGA with Fundal height of Fundal Height: 23 cm  Pelvic: Cervical exam deferred           Extremities: Edema: Trace  Fetal Status: Fetal Heart Rate (bpm): 145  Movement: Present   No results found for this or any previous visit (from the past 24 hour(s)).  Assessment & Plan:  Low-risk pregnancy of a 25 y.o., [id number] at [redacted]w[redacted]d with an Estimated Date of Delivery: 05/04/21   1.  Encounter for supervision of other normal pregnancy, first trimester -Reviewed blood draw procedures and labs which also include check of iron level.  -Discussed how results of GTT are handled including diabetic education and BS testing for abnormal results and routine care for normal results.   2. Encounter for immunization -Tdap and Flu Given  3. Nausea/vomiting in pregnancy -Taking zofran and phenergan. -Discussed eating small snack prior to getting up out of bed.  -Instructed to notify office if N/V continues despite this intervention.   4. [redacted] weeks gestation of pregnancy -Doing well overall.     Meds: No orders of the defined types were placed in this encounter.  Labs/procedures today:  Lab Orders     Glucose Tolerance, 2 Hours w/1 Hour     RPR     HIV antibody (with reflex)     CBC   Reviewed: Preterm labor symptoms and general obstetric precautions including but not limited to vaginal bleeding, contractions, leaking of fluid and fetal movement were reviewed in detail with the patient.  All questions were answered.  Follow-up: Return in about 3 weeks (around 02/27/2021) for Virtual LR-ROB.  Orders Placed This Encounter  Procedures  . Tdap vaccine greater than or equal to 7yo IM  . Flu Vaccine QUAD 36+ mos IM (Fluarix, Quad PF)  . Glucose Tolerance, 2 Hours w/1 Hour  . RPR  . HIV antibody (with reflex)  . CBC   [name] MSN, CNM 02/06/2021

## 2021-02-06 NOTE — Patient Instructions (Signed)
Nausea and Vomiting, Adult Nausea is the feeling that you have an upset stomach or that you are about to vomit. Vomiting is when stomach contents are thrown up and out of the mouth as a result of nausea. Vomiting can make you feel weak and cause you to become dehydrated. Dehydration can make you feel tired and thirsty, cause you to have a dry mouth, and decrease how often you urinate. Older adults and people with other diseases or a weak disease-fighting system (immune system) are at higher risk for dehydration. It is important to treat your nausea and vomiting as told by your health care provider. Follow these instructions at home: Watch your symptoms for any changes. Tell your health care provider about them. Follow these instructions to care for yourself at home. Eating and drinking  Take an oral rehydration solution (ORS). This is a drink that is sold at pharmacies and retail stores.  Drink clear fluids slowly and in small amounts as you are able. Clear fluids include water, ice chips, low-calorie sports drinks, and fruit juice that has water added (diluted fruit juice).  Eat bland, easy-to-digest foods in small amounts as you are able. These foods include bananas, applesauce, rice, lean meats, toast, and crackers.  Avoid fluids that contain a lot of sugar or caffeine, such as energy drinks, sports drinks, and soda.  Avoid alcohol.  Avoid spicy or fatty foods.      General instructions  Take over-the-counter and prescription medicines only as told by your health care provider.  Drink enough fluid to keep your urine pale yellow.  Wash your hands often using soap and water. If soap and water are not available, use hand sanitizer.  Make sure that all people in your household wash their hands well and often.  Rest at home while you recover.  Watch your condition for any changes.  Breathe slowly and deeply when you feel nauseated.  Keep all follow-up visits as told by your health  care provider. This is important. Contact a health care provider if:  Your symptoms get worse.  You have new symptoms.  You have a fever.  You cannot drink fluids without vomiting.  Your nausea does not go away after 2 days.  You feel light-headed or dizzy.  You have a headache.  You have muscle cramps.  You have a rash.  You have pain while urinating. Get help right away if:  You have pain in your chest, neck, arm, or jaw.  You feel extremely weak or you faint.  You have persistent vomiting.  You have vomit that is bright red or looks like black coffee grounds.  You have bloody or black stools or stools that look like tar.  You have a severe headache, a stiff neck, or both.  You have severe pain, cramping, or bloating in your abdomen.  You have difficulty breathing, or you are breathing very quickly.  Your heart is beating very quickly.  Your skin feels cold and clammy.  You feel confused.  You have signs of dehydration, such as: ? Dark urine, very little urine, or no urine. ? Cracked lips. ? Dry mouth. ? Sunken eyes. ? Sleepiness. ? Weakness. These symptoms may represent a serious problem that is an emergency. Do not wait to see if the symptoms will go away. Get medical help right away. Call your local emergency services (911 in the [place] Do not drive yourself to the hospital. Summary  Nausea is the feeling that you have an upset  stomach or that you are about to vomit. As nausea gets worse, it can lead to vomiting. Vomiting can make you feel weak and cause you to become dehydrated.  Follow instructions from your health care provider about eating and drinking to prevent dehydration.  Take over-the-counter and prescription medicines only as told by your health care provider.  Contact your health care provider if your symptoms get worse, or you have new symptoms.  Keep all follow-up visits as told by your health care provider. This is important. This  information is not intended to replace advice given to you by your health care provider. Make sure you discuss any questions you have with your health care provider. Document Revised: 04/01/2019 Document Reviewed: 05/18/2018 Elsevier Patient Education  2021 Elsevier Inc.  

## 2021-02-07 LAB — GLUCOSE TOLERANCE, 2 HOURS W/ 1HR
Glucose, 1 hour: 149 mg/dL (ref 65–179)
Glucose, 2 hour: 115 mg/dL (ref 65–152)
Glucose, Fasting: 92 mg/dL — ABNORMAL HIGH (ref 65–91)

## 2021-02-07 LAB — CBC
Hematocrit: 35.9 % (ref 34.0–46.6)
Hemoglobin: 11.7 g/dL (ref 11.1–15.9)
MCH: 27.7 pg (ref 26.6–33.0)
MCHC: 32.6 g/dL (ref 31.5–35.7)
MCV: 85 fL (ref 79–97)
Platelets: 181 10*3/uL (ref 150–450)
RBC: 4.23 x10E6/uL (ref 3.77–5.28)
RDW: 13.8 % (ref 11.7–15.4)
WBC: 11.3 10*3/uL — ABNORMAL HIGH (ref 3.4–10.8)

## 2021-02-07 LAB — HIV ANTIBODY (ROUTINE TESTING W REFLEX): HIV Screen 4th Generation wRfx: NONREACTIVE

## 2021-02-07 LAB — RPR: RPR Ser Ql: NONREACTIVE

## 2021-02-19 DIAGNOSIS — O24419 Gestational diabetes mellitus in pregnancy, unspecified control: Secondary | ICD-10-CM

## 2021-02-19 HISTORY — DX: Gestational diabetes mellitus in pregnancy, unspecified control: O24.419

## 2021-02-26 ENCOUNTER — Other Ambulatory Visit: Payer: Self-pay | Admitting: Maternal & Fetal Medicine

## 2021-02-26 ENCOUNTER — Other Ambulatory Visit: Payer: Self-pay

## 2021-02-26 ENCOUNTER — Ambulatory Visit: Payer: Medicaid Other | Attending: Obstetrics and Gynecology

## 2021-02-26 ENCOUNTER — Ambulatory Visit: Payer: Medicaid Other | Admitting: *Deleted

## 2021-02-26 ENCOUNTER — Encounter: Payer: Self-pay | Admitting: *Deleted

## 2021-02-26 DIAGNOSIS — D563 Thalassemia minor: Secondary | ICD-10-CM | POA: Insufficient documentation

## 2021-02-26 DIAGNOSIS — Z3481 Encounter for supervision of other normal pregnancy, first trimester: Secondary | ICD-10-CM

## 2021-02-26 DIAGNOSIS — O365930 Maternal care for other known or suspected poor fetal growth, third trimester, not applicable or unspecified: Secondary | ICD-10-CM | POA: Diagnosis not present

## 2021-02-26 DIAGNOSIS — Z148 Genetic carrier of other disease: Secondary | ICD-10-CM | POA: Diagnosis not present

## 2021-02-26 DIAGNOSIS — Z3A30 30 weeks gestation of pregnancy: Secondary | ICD-10-CM

## 2021-02-26 DIAGNOSIS — Z862 Personal history of diseases of the blood and blood-forming organs and certain disorders involving the immune mechanism: Secondary | ICD-10-CM | POA: Diagnosis not present

## 2021-02-27 ENCOUNTER — Other Ambulatory Visit: Payer: Self-pay | Admitting: *Deleted

## 2021-02-27 ENCOUNTER — Telehealth (INDEPENDENT_AMBULATORY_CARE_PROVIDER_SITE_OTHER): Payer: Medicaid Other | Admitting: Obstetrics

## 2021-02-27 ENCOUNTER — Encounter: Payer: Self-pay | Admitting: Obstetrics

## 2021-02-27 DIAGNOSIS — Z98891 History of uterine scar from previous surgery: Secondary | ICD-10-CM

## 2021-02-27 DIAGNOSIS — D563 Thalassemia minor: Secondary | ICD-10-CM

## 2021-02-27 DIAGNOSIS — Z3A30 30 weeks gestation of pregnancy: Secondary | ICD-10-CM

## 2021-02-27 DIAGNOSIS — D573 Sickle-cell trait: Secondary | ICD-10-CM | POA: Diagnosis not present

## 2021-02-27 DIAGNOSIS — O219 Vomiting of pregnancy, unspecified: Secondary | ICD-10-CM | POA: Diagnosis not present

## 2021-02-27 DIAGNOSIS — Z148 Genetic carrier of other disease: Secondary | ICD-10-CM

## 2021-02-27 DIAGNOSIS — O99013 Anemia complicating pregnancy, third trimester: Secondary | ICD-10-CM

## 2021-02-27 DIAGNOSIS — Z3480 Encounter for supervision of other normal pregnancy, unspecified trimester: Secondary | ICD-10-CM

## 2021-02-27 DIAGNOSIS — O365930 Maternal care for other known or suspected poor fetal growth, third trimester, not applicable or unspecified: Secondary | ICD-10-CM

## 2021-02-27 MED ORDER — DOXYLAMINE-PYRIDOXINE 10-10 MG PO TBEC: DELAYED_RELEASE_TABLET | ORAL | 5 refills | Status: DC

## 2021-02-27 NOTE — Progress Notes (Signed)
    TELEHEALTH OBSTETRICS VISIT ENCOUNTER NOTE  Provider location: [facility] [place]   I connected with [name] Weber on 02/27/21 at 10:00 AM EST by telephone at home and verified that I am speaking with the correct person using two identifiers. Of note, unable to do video encounter due to technical difficulties.    I discussed the limitations, risks, security and privacy concerns of performing an evaluation and management service by telephone and the availability of in person appointments. I also discussed with the patient that there may be a patient responsible charge related to this service. The patient expressed understanding and agreed to proceed.  Subjective:  [name] Weber is a 25 y.o. [id number] at [redacted]w[redacted]d being followed for ongoing prenatal care.  She is currently monitored for the following issues for this low-risk pregnancy and has Sickle cell trait (HCC); Intractable nausea and vomiting; Hypokalemia; Cannabinoid hyperemesis syndrome; Alcohol abuse; Leukocytosis; Encounter for supervision of other normal pregnancy, first trimester; Former tobacco use; and Alpha thalassemia silent carrier on their problem list.  Patient reports nausea and vomiting. Reports fetal movement. Denies any contractions, bleeding or leaking of fluid.   The following portions of the patient's history were reviewed and updated as appropriate: allergies, current medications, past family history, past medical history, past social history, past surgical history and problem list.   Objective:  Last menstrual period 07/28/2020, unknown if currently breastfeeding. General:  Alert, oriented and cooperative.   Mental Status: Normal mood and affect perceived. Normal judgment and thought content.  Rest of physical exam deferred due to type of encounter  Assessment and Plan:  Pregnancy: [id number] at [redacted]w[redacted]d 1. Encounter for supervision of other normal pregnancy  2. Nausea and vomiting in  pregnancy Rx: - Doxylamine-Pyridoxine (DICLEGIS) 10-10 MG TBEC; 1 tab in AM, 1 tab mid afternoon 2 tabs at bedtime. Max dose 4 tabs daily.  Dispense: 100 tablet; Refill: 5  3. Alpha thalassemia silent carrier   Preterm labor symptoms and general obstetric precautions including but not limited to vaginal bleeding, contractions, leaking of fluid and fetal movement were reviewed in detail with the patient.  I discussed the assessment and treatment plan with the patient. The patient was provided an opportunity to ask questions and all were answered. The patient agreed with the plan and demonstrated an understanding of the instructions. The patient was advised to call back or seek an in-person office evaluation/go to [facility] at [facility] for any urgent or concerning symptoms. Please refer to After Visit Summary for other counseling recommendations.   I provided 15 minutes of non-face-to-face time during this encounter.  Return in about 2 weeks (around 03/13/2021) for MyChart.  Future Appointments  Date Time Provider Department Center  03/06/2021  8:30 AM [facility] NURSE [facility]  03/06/2021  8:45 AM [facility] US5 [facility]  03/12/2021 12:30 PM [facility] NURSE [facility]  03/12/2021 12:45 PM [facility] US5 [facility]  03/19/2021 10:30 AM [facility] NURSE [facility]  03/19/2021 10:45 AM [facility] US4 [facility]  03/26/2021 11:00 AM [facility] NURSE [facility]  03/26/2021 11:15 AM [facility] US2 [facility]  04/02/2021 10:45 AM [facility] NURSE [facility]  04/02/2021 11:00 AM [facility] US1 [facility]    Coral Ceo, MD [facility], [facility] 02/27/21

## 2021-02-27 NOTE — Progress Notes (Signed)
Virtual ROB   CC: Still having severe Nausea notes mild relief with medication.

## 2021-02-27 NOTE — Patient Instructions (Signed)
Hyperemesis Gravidarum Hyperemesis gravidarum is a severe form of nausea and vomiting that happens during pregnancy. Hyperemesis is worse than morning sickness. It may cause you to have nausea or vomiting all day for many days. It may keep you from eating and drinking enough food and liquids, which can lead to dehydration, malnutrition, and weight loss. Hyperemesis usually occurs during the first half (the first 20 weeks) of pregnancy. It often goes away once a woman is in her second half of pregnancy. However, sometimes hyperemesis continues through an entire pregnancy. What are the causes? The cause of this condition is not known. It may be associated with:  Changes in hormones in the body during pregnancy.  Changes in the gastrointestinal system.  Genetic or inherited conditions. What are the signs or symptoms? Symptoms of this condition include:  Severe nausea and vomiting that does not go away.  Problems keeping food down.  Weight loss.  Loss of body fluid (dehydration).  Loss of appetite. You may have no desire to eat or you may not like the food you have previously enjoyed. How is this diagnosed? This condition may be diagnosed based on your medical history, your symptoms, and a physical exam. You may also have other tests, including:  Blood tests.  Urine tests.  Blood pressure tests.  Ultrasound to look for problems with the placenta or to check if you are pregnant with more than one baby. How is this treated? This condition is managed by controlling symptoms. This may include:  Following an eating plan. This can help to lessen nausea and vomiting.  Treatments that do not use medicine. These include acupressure bracelets, hypnosis, and eating or drinking foods or fluids that contain ginger, ginger ale, or ginger tea.  Taking prescription medicine or over-the-counter medicine as told by your health care provider.  Continuing to take prenatal vitamins. You may need to  change what kind you take and when you take them. Follow your health care provider's instructions about prenatal vitamins. An eating plan and medicines are often used together to help control symptoms. If medicines do not help relieve nausea and vomiting, you may need to receive fluids through an IV at the hospital. Follow these instructions at home: To help relieve your symptoms, listen to your body. Everyone is different and has different preferences. Find what works best for you. Here are some things you can try to help relieve your symptoms: Meals and snacks  Eat 5-6 small meals daily instead of 3 large meals. Eating small meals and snacks can help you avoid an empty stomach.  Before getting out of bed, eat a couple of crackers to avoid moving around on an empty stomach.  Eat a protein-rich snack before bed. Examples include cheese and crackers, or a peanut butter sandwich made with 1 slice of whole-wheat bread and 1 tsp (5 g) of peanut butter.  Eat and drink slowly.  Try eating starchy foods as these are usually tolerated well. Examples include cereal, toast, bread, potatoes, pasta, rice, and pretzels.  Eat at least one serving of protein with your meals and snacks. Protein options include lean meats, poultry, seafood, beans, nuts, nut butters, eggs, cheese, and yogurt.  Eat or suck on things that have ginger in them. It may help to relieve nausea. Add  tsp (0.44 g) ground ginger to hot tea, or choose ginger tea.   Fluids It is important to stay hydrated. Try to:  Drink small amounts of fluids often.  Drink fluids 30 minutes   before or after a meal to help lessen the feeling of a full stomach.  Drink 100% fruit juice or an electrolyte drink. An electrolyte drink contains sodium, potassium, and chloride.  Drink fluids that are cold, clear, and carbonated or sour. These include lemonade, ginger ale, lemon-lime soda, ice water, and sparkling water. Things to avoid Avoid the  following:  Eating foods that trigger your symptoms. These may include spicy foods, coffee, high-fat foods, very sweet foods, and acidic foods.  Drinking more than 1 cup of fluid at a time.  Skipping meals. Nausea can be more intense on an empty stomach. If you cannot tolerate food, do not force it. Try sucking on ice chips or other frozen items and make up for missed calories later.  Lying down within 2 hours after eating.  Being exposed to environmental triggers. These may include food smells, smoky rooms, closed spaces, rooms with strong smells, warm or humid places, overly loud and noisy rooms, and rooms with motion or flickering lights. Try eating meals in a well-ventilated area that is free of strong smells.  Making quick and sudden changes in your movement.  Taking iron pills and multivitamins that contain iron. If you take prescription iron pills, do not stop taking them unless your health care provider approves.  Preparing food. The smell of food can spoil your appetite or trigger nausea. General instructions  Brush your teeth or use a mouth rinse after meals.  Take over-the-counter and prescription medicines only as told by your health care provider.  Follow instructions from your health care provider about eating or drinking restrictions.  Talk with your health care provider about starting a supplement of vitamin B6.  Continue to take your prenatal vitamins as told by your health care provider. If you are having trouble taking your prenatal vitamins, talk with your health care provider about other options.  Keep all follow-up visits. This is important. Follow-up visits include prenatal visits. Contact a health care provider if:  You have pain in your abdomen.  You have a severe headache.  You have vision problems.  You are losing weight.  You feel weak or dizzy.  You cannot eat or drink without vomiting, especially if this goes on for a full day. Get help right  away if:  You cannot drink fluids without vomiting.  You vomit blood.  You have constant nausea and vomiting.  You are very weak.  You faint.  You have a fever and your symptoms suddenly get worse. Summary  Hyperemesis gravidarum is a severe form of nausea and vomiting that happens during pregnancy.  Making some changes to your eating habits may help relieve nausea and vomiting.  This condition may be managed with lifestyle changes and medicines as prescribed by your health care provider.  If medicines do not help relieve nausea and vomiting, you may need to receive fluids through an IV at the hospital. This information is not intended to replace advice given to you by your health care provider. Make sure you discuss any questions you have with your health care provider. Document Revised: 07/02/2020 Document Reviewed: 07/02/2020 Elsevier Patient Education  2021 Elsevier Inc.  

## 2021-03-06 ENCOUNTER — Ambulatory Visit: Payer: Medicaid Other | Attending: Obstetrics and Gynecology

## 2021-03-06 ENCOUNTER — Encounter: Payer: Self-pay | Admitting: *Deleted

## 2021-03-06 ENCOUNTER — Other Ambulatory Visit: Payer: Self-pay

## 2021-03-06 ENCOUNTER — Ambulatory Visit: Payer: Medicaid Other | Admitting: *Deleted

## 2021-03-06 DIAGNOSIS — Z3481 Encounter for supervision of other normal pregnancy, first trimester: Secondary | ICD-10-CM

## 2021-03-06 DIAGNOSIS — D563 Thalassemia minor: Secondary | ICD-10-CM

## 2021-03-06 DIAGNOSIS — Z3A31 31 weeks gestation of pregnancy: Secondary | ICD-10-CM | POA: Diagnosis not present

## 2021-03-06 DIAGNOSIS — Z148 Genetic carrier of other disease: Secondary | ICD-10-CM

## 2021-03-06 DIAGNOSIS — O365930 Maternal care for other known or suspected poor fetal growth, third trimester, not applicable or unspecified: Secondary | ICD-10-CM | POA: Insufficient documentation

## 2021-03-06 DIAGNOSIS — O24419 Gestational diabetes mellitus in pregnancy, unspecified control: Secondary | ICD-10-CM

## 2021-03-07 ENCOUNTER — Encounter: Payer: Self-pay | Admitting: Family Medicine

## 2021-03-07 ENCOUNTER — Telehealth: Payer: Self-pay

## 2021-03-07 ENCOUNTER — Other Ambulatory Visit: Payer: Self-pay

## 2021-03-07 DIAGNOSIS — O24419 Gestational diabetes mellitus in pregnancy, unspecified control: Secondary | ICD-10-CM

## 2021-03-07 DIAGNOSIS — O365990 Maternal care for other known or suspected poor fetal growth, unspecified trimester, not applicable or unspecified: Secondary | ICD-10-CM | POA: Insufficient documentation

## 2021-03-07 MED ORDER — ACCU-CHEK SOFTCLIX LANCETS MISC: 0 refills | Status: DC

## 2021-03-07 MED ORDER — GLUCOSE BLOOD VI STRP: ORAL_STRIP | 12 refills | Status: DC

## 2021-03-07 MED ORDER — ACCU-CHEK AVIVA PLUS W/DEVICE KIT: 1.0000 | PACK | Freq: Once | 0 refills | Status: AC

## 2021-03-07 NOTE — Telephone Encounter (Signed)
[name] Weber  1996/09/06  Patient called and verified her identity via birth date and telephone number on file.  Patient informed that provider aware of call regarding concern for GDM after MFM visit.  Provider informs patient that after consulting with MD it was concluded that provider had misinterpreted results based on incorrect information and that she does is in fact have GDM.  Reviewed GDM diagnosis, management, and need for follow up.  Provider also reviewed recent growth [place] where infant found to be in 3.5%ile.  Patient expresses concern with overall growth and provider educates patient on potential causes of growth restriction, management, and potential delivery times.  Patient without further questions or concerns and expresses gratitude for provider calling to discuss diagnosis and plan.  Patient encouraged to continue to utilize mychart and/or triage line for any questions or concerns.  Patient informed that triage nurse would contact her regarding scheduling for diabetic education and glucometer.  Patient verbalizes understanding and without further questions or concerns.   [name] MSN, CNM Advanced Practice Provider, [facility]  **This visit was completed, in its entirety, via telehealth communications.  I personally spent >/=7 minutes on the phone providing recommendations, education, and guidance.**

## 2021-03-07 NOTE — Telephone Encounter (Signed)
Patient have been notified of diabetes testing supplies being called into [facility] pharmacy.

## 2021-03-12 ENCOUNTER — Encounter: Payer: Self-pay | Admitting: *Deleted

## 2021-03-12 ENCOUNTER — Other Ambulatory Visit: Payer: Self-pay

## 2021-03-12 ENCOUNTER — Ambulatory Visit: Payer: Medicaid Other | Admitting: *Deleted

## 2021-03-12 ENCOUNTER — Ambulatory Visit: Payer: Medicaid Other | Attending: Obstetrics

## 2021-03-12 DIAGNOSIS — D563 Thalassemia minor: Secondary | ICD-10-CM | POA: Diagnosis present

## 2021-03-12 DIAGNOSIS — O365931 Maternal care for other known or suspected poor fetal growth, third trimester, fetus 1: Secondary | ICD-10-CM | POA: Diagnosis not present

## 2021-03-12 DIAGNOSIS — Z3481 Encounter for supervision of other normal pregnancy, first trimester: Secondary | ICD-10-CM | POA: Diagnosis present

## 2021-03-12 DIAGNOSIS — O365930 Maternal care for other known or suspected poor fetal growth, third trimester, not applicable or unspecified: Secondary | ICD-10-CM | POA: Insufficient documentation

## 2021-03-13 ENCOUNTER — Ambulatory Visit (INDEPENDENT_AMBULATORY_CARE_PROVIDER_SITE_OTHER): Payer: Medicaid Other | Admitting: Obstetrics

## 2021-03-13 DIAGNOSIS — Z3480 Encounter for supervision of other normal pregnancy, unspecified trimester: Secondary | ICD-10-CM

## 2021-03-14 NOTE — Progress Notes (Signed)
Patient left without being seen.  Brock Bad, MD 03/14/2021 3:45 PM

## 2021-03-19 ENCOUNTER — Other Ambulatory Visit: Payer: Self-pay

## 2021-03-19 ENCOUNTER — Ambulatory Visit (INDEPENDENT_AMBULATORY_CARE_PROVIDER_SITE_OTHER): Payer: Medicaid Other | Admitting: Advanced Practice Midwife

## 2021-03-19 ENCOUNTER — Encounter: Payer: Self-pay | Admitting: Advanced Practice Midwife

## 2021-03-19 ENCOUNTER — Ambulatory Visit: Payer: Medicaid Other | Admitting: *Deleted

## 2021-03-19 ENCOUNTER — Encounter: Payer: Self-pay | Admitting: *Deleted

## 2021-03-19 ENCOUNTER — Other Ambulatory Visit: Payer: Self-pay | Admitting: *Deleted

## 2021-03-19 ENCOUNTER — Ambulatory Visit: Payer: Medicaid Other | Attending: Obstetrics and Gynecology

## 2021-03-19 VITALS — BP 123/74 | HR 106 | Wt 138.0 lb

## 2021-03-19 DIAGNOSIS — M7918 Myalgia, other site: Secondary | ICD-10-CM

## 2021-03-19 DIAGNOSIS — Z3481 Encounter for supervision of other normal pregnancy, first trimester: Secondary | ICD-10-CM

## 2021-03-19 DIAGNOSIS — Z862 Personal history of diseases of the blood and blood-forming organs and certain disorders involving the immune mechanism: Secondary | ICD-10-CM

## 2021-03-19 DIAGNOSIS — O219 Vomiting of pregnancy, unspecified: Secondary | ICD-10-CM

## 2021-03-19 DIAGNOSIS — D563 Thalassemia minor: Secondary | ICD-10-CM | POA: Insufficient documentation

## 2021-03-19 DIAGNOSIS — O24419 Gestational diabetes mellitus in pregnancy, unspecified control: Secondary | ICD-10-CM

## 2021-03-19 DIAGNOSIS — O0990 Supervision of high risk pregnancy, unspecified, unspecified trimester: Secondary | ICD-10-CM

## 2021-03-19 DIAGNOSIS — Z362 Encounter for other antenatal screening follow-up: Secondary | ICD-10-CM | POA: Diagnosis not present

## 2021-03-19 DIAGNOSIS — O365930 Maternal care for other known or suspected poor fetal growth, third trimester, not applicable or unspecified: Secondary | ICD-10-CM | POA: Diagnosis not present

## 2021-03-19 DIAGNOSIS — O26893 Other specified pregnancy related conditions, third trimester: Secondary | ICD-10-CM

## 2021-03-19 DIAGNOSIS — R109 Unspecified abdominal pain: Secondary | ICD-10-CM

## 2021-03-19 DIAGNOSIS — Z3A33 33 weeks gestation of pregnancy: Secondary | ICD-10-CM | POA: Diagnosis not present

## 2021-03-19 DIAGNOSIS — Z148 Genetic carrier of other disease: Secondary | ICD-10-CM

## 2021-03-19 MED ORDER — CYCLOBENZAPRINE HCL 10 MG PO TABS: 5.0000 mg | ORAL_TABLET | Freq: Three times a day (TID) | ORAL | 0 refills | Status: DC | PRN

## 2021-03-19 NOTE — Progress Notes (Signed)
PRENATAL VISIT NOTE  Subjective:  [name] Weber is a 25 y.o. [id number] at [redacted]w[redacted]d being seen today for ongoing prenatal care.  She is currently monitored for the following issues for this high-risk pregnancy and has Sickle cell trait (HCC); Intractable nausea and vomiting; Hypokalemia; Cannabinoid hyperemesis syndrome; Alcohol abuse; Leukocytosis; Encounter for supervision of other normal pregnancy, first trimester; Former tobacco use; Alpha thalassemia silent carrier; Gestational diabetes; and IUGR (intrauterine growth restriction) affecting care of mother on their problem list.  Patient reports LLQ pain x 3 days, pain when she sits up, turns over, coughs..  Contractions: Irritability. Vag. Bleeding: None.  Movement: Present. Denies leaking of fluid.   The following portions of the patient's history were reviewed and updated as appropriate: allergies, current medications, past family history, past medical history, past social history, past surgical history and problem list.   Objective:   Vitals:   03/19/21 1325  BP: 123/74  Pulse: (!) 106  Weight: 138 lb (62.6 kg)    Fetal Status: Fetal Heart Rate (bpm): 152   Movement: Present     General:  Alert, oriented and cooperative. Patient is in no acute distress.  Skin: Skin is warm and dry. No rash noted.   Cardiovascular: Normal heart rate noted  Respiratory: Normal respiratory effort, no problems with respiration noted  Abdomen: Soft, gravid, appropriate for gestational age.  Pain/Pressure: Present     Pelvic: Cervical exam deferred        Extremities: Normal range of motion.  Edema: None  Mental Status: Normal mood and affect. Normal behavior. Normal judgment and thought content.   Assessment and Plan:  Pregnancy: [id number] at [redacted]w[redacted]d 1. Supervision of high risk pregnancy, antepartum --Anticipatory guidance about next visits/weeks of pregnancy given. --Next visit in 1-2 weeks with MD to review glucose values.   2. Gestational  diabetes mellitus (GDM) in third trimester, gestational diabetes method of control unspecified --Pt unsure of how to take blood sugars. Has a log but some am values were after eating, not fasting and PP were not all 2 hours after meal.  Highest glucose value PP was 153. --Diabetic education rescheduled for pt today and I provided some education about how to take her sugars fasting and 2 hours PP. Reviewed healthy food choices and carb counted with pt and her s/o today. --F/U in 1-2 weeks with MD to review log again  3. Nausea and vomiting in pregnancy --Stable, doing well today  4. Abdominal pain during pregnancy in third trimester --C/W musculoskeletal/round ligament pain --UA today --Rest/ice/heat/warm bath/Tylenol/pregnancy support belt --Add Flexeril 5-10 mg TID PRN  5. Poor fetal growth affecting management of mother in third trimester, single or unspecified fetus --MFM [place] today, 03/19/21: EFW 3.9%tile, BPP 8/8 with normal dopplers   Preterm labor symptoms and general obstetric precautions including but not limited to vaginal bleeding, contractions, leaking of fluid and fetal movement were reviewed in detail with the patient. Please refer to After Visit Summary for other counseling recommendations.   Return in about 2 weeks (around 04/02/2021).  Future Appointments  Date Time Provider Department Center  03/26/2021 11:00 AM [facility] NURSE [facility]  03/26/2021 11:15 AM [facility] US2 [facility]  04/02/2021 10:45 AM [facility] NURSE [facility]  04/02/2021 11:00 AM [facility] US1 [facility]  04/09/2021 10:30 AM [facility] NURSE [facility]  04/09/2021 10:45 AM [facility] US4 [facility]  04/16/2021 11:15 AM [facility] NURSE [facility]  04/16/2021 11:30 AM [facility] US3 [facility]    Sharen Counter, CNM

## 2021-03-19 NOTE — Patient Instructions (Signed)

## 2021-03-19 NOTE — Progress Notes (Signed)
ROB [redacted]w[redacted]d  CC: Pain on left side x 3 days. Pt has not tried any comfort measures yet.  Pt needs to discuss blood sugar readings. Pt states she was not instructed on how to monitor sugars.  Referral was placed 03/07/21 for GDM teaching. Per referral notes a message was left for pt to call and make appt.

## 2021-03-20 ENCOUNTER — Ambulatory Visit: Payer: Medicaid Other

## 2021-03-26 ENCOUNTER — Other Ambulatory Visit: Payer: Self-pay

## 2021-03-26 ENCOUNTER — Encounter: Payer: Self-pay | Admitting: *Deleted

## 2021-03-26 ENCOUNTER — Ambulatory Visit: Payer: Medicaid Other | Attending: Obstetrics and Gynecology

## 2021-03-26 ENCOUNTER — Ambulatory Visit: Payer: Medicaid Other | Admitting: *Deleted

## 2021-03-26 DIAGNOSIS — O24419 Gestational diabetes mellitus in pregnancy, unspecified control: Secondary | ICD-10-CM

## 2021-03-26 DIAGNOSIS — Z3A34 34 weeks gestation of pregnancy: Secondary | ICD-10-CM | POA: Diagnosis not present

## 2021-03-26 DIAGNOSIS — D563 Thalassemia minor: Secondary | ICD-10-CM | POA: Diagnosis present

## 2021-03-26 DIAGNOSIS — Z148 Genetic carrier of other disease: Secondary | ICD-10-CM | POA: Diagnosis not present

## 2021-03-26 DIAGNOSIS — Z3481 Encounter for supervision of other normal pregnancy, first trimester: Secondary | ICD-10-CM | POA: Diagnosis present

## 2021-03-26 DIAGNOSIS — O365930 Maternal care for other known or suspected poor fetal growth, third trimester, not applicable or unspecified: Secondary | ICD-10-CM | POA: Insufficient documentation

## 2021-03-26 DIAGNOSIS — Z862 Personal history of diseases of the blood and blood-forming organs and certain disorders involving the immune mechanism: Secondary | ICD-10-CM

## 2021-03-27 ENCOUNTER — Encounter: Payer: Medicaid Other | Admitting: Dietician

## 2021-03-27 DIAGNOSIS — O24410 Gestational diabetes mellitus in pregnancy, diet controlled: Secondary | ICD-10-CM | POA: Diagnosis present

## 2021-04-01 ENCOUNTER — Encounter: Payer: Self-pay | Admitting: Dietician

## 2021-04-01 NOTE — Progress Notes (Signed)
Patient was seen on 03/27/2021 for Gestational Diabetes self-management class at the [facility]. The following learning objectives were met by the patient during this course:   States the definition of Gestational Diabetes  States why dietary management is important in controlling blood glucose  Describes the effects each nutrient has on blood glucose levels  Demonstrates ability to create a balanced meal plan  Demonstrates carbohydrate counting   States when to check blood glucose levels  Demonstrates proper blood glucose monitoring techniques  States the effect of stress and exercise on blood glucose levels  States the importance of limiting caffeine and abstaining from alcohol and smoking  Blood glucose from home:  Accu Check Blood glucose reading: 74  Patient instructed to monitor glucose levels: FBS: 60 - <90 1 hour: <140 2 hour: <120  *Patient received handouts:  Nutrition Diabetes and Pregnancy  Carbohydrate Counting List  Patient will be seen for follow-up as needed.

## 2021-04-02 ENCOUNTER — Other Ambulatory Visit: Payer: Self-pay

## 2021-04-02 ENCOUNTER — Ambulatory Visit: Payer: Medicaid Other | Admitting: *Deleted

## 2021-04-02 ENCOUNTER — Encounter: Payer: Self-pay | Admitting: Obstetrics and Gynecology

## 2021-04-02 ENCOUNTER — Encounter: Payer: Self-pay | Admitting: *Deleted

## 2021-04-02 ENCOUNTER — Other Ambulatory Visit: Payer: Self-pay | Admitting: Advanced Practice Midwife

## 2021-04-02 ENCOUNTER — Ambulatory Visit (INDEPENDENT_AMBULATORY_CARE_PROVIDER_SITE_OTHER): Payer: Medicaid Other | Admitting: Obstetrics and Gynecology

## 2021-04-02 ENCOUNTER — Ambulatory Visit: Payer: Medicaid Other | Attending: Obstetrics

## 2021-04-02 VITALS — BP 101/66 | HR 109 | Wt 147.0 lb

## 2021-04-02 DIAGNOSIS — Z862 Personal history of diseases of the blood and blood-forming organs and certain disorders involving the immune mechanism: Secondary | ICD-10-CM

## 2021-04-02 DIAGNOSIS — O24419 Gestational diabetes mellitus in pregnancy, unspecified control: Secondary | ICD-10-CM | POA: Diagnosis not present

## 2021-04-02 DIAGNOSIS — Z3A35 35 weeks gestation of pregnancy: Secondary | ICD-10-CM

## 2021-04-02 DIAGNOSIS — D563 Thalassemia minor: Secondary | ICD-10-CM | POA: Insufficient documentation

## 2021-04-02 DIAGNOSIS — O365930 Maternal care for other known or suspected poor fetal growth, third trimester, not applicable or unspecified: Secondary | ICD-10-CM | POA: Insufficient documentation

## 2021-04-02 DIAGNOSIS — Z3481 Encounter for supervision of other normal pregnancy, first trimester: Secondary | ICD-10-CM

## 2021-04-02 DIAGNOSIS — Z148 Genetic carrier of other disease: Secondary | ICD-10-CM | POA: Diagnosis not present

## 2021-04-02 DIAGNOSIS — O24410 Gestational diabetes mellitus in pregnancy, diet controlled: Secondary | ICD-10-CM

## 2021-04-02 NOTE — Progress Notes (Signed)
HOB c/o constipation x 1 week, back pain 6/10

## 2021-04-02 NOTE — Progress Notes (Signed)
C/o" constipation and vaginal pressure."

## 2021-04-02 NOTE — Progress Notes (Signed)
   PRENATAL VISIT NOTE  Subjective:  [name] Weber is a 25 y.o. [phone] at [redacted]w[redacted]d being seen today for ongoing prenatal care.  She is currently monitored for the following issues for this high-risk pregnancy and has Sickle cell trait (HCC); Intractable nausea and vomiting; Hypokalemia; Cannabinoid hyperemesis syndrome; Alcohol abuse; Leukocytosis; Encounter for supervision of other normal pregnancy, first trimester; Former tobacco use; Alpha thalassemia silent carrier; Gestational diabetes; and IUGR (intrauterine growth restriction) affecting care of mother on their problem list.  Patient reports no complaints.  Contractions: Irregular. Vag. Bleeding: None.  Movement: Present. Denies leaking of fluid.   The following portions of the patient's history were reviewed and updated as appropriate: allergies, current medications, past family history, past medical history, past social history, past surgical history and problem list.   Objective:   Vitals:   04/02/21 1400  BP: 101/66  Pulse: (!) 109  Weight: 147 lb (66.7 kg)    Fetal Status: Fetal Heart Rate (bpm): 142 Fundal Height: 33 cm Movement: Present     General:  Alert, oriented and cooperative. Patient is in no acute distress.  Skin: Skin is warm and dry. No rash noted.   Cardiovascular: Normal heart rate noted  Respiratory: Normal respiratory effort, no problems with respiration noted  Abdomen: Soft, gravid, appropriate for gestational age.  Pain/Pressure: Present     Pelvic: Cervical exam deferred        Extremities: Normal range of motion.  Edema: None  Mental Status: Normal mood and affect. Normal behavior. Normal judgment and thought content.   Assessment and Plan:  Pregnancy: [id number] at [redacted]w[redacted]d 1. Encounter for supervision of other normal pregnancy, first trimester Patient is doing well without complaints Cultures next visit  2. Diet controlled gestational diabetes mellitus (GDM) in third trimester CBGs reviewed and great  majority within range COntinue diet control  3. Poor fetal growth affecting management of mother in third trimester, single or unspecified fetus Patient with dopplers today. Ultrasound report not yet available. Per patient, plan for delivery at 37 weeks  Preterm labor symptoms and general obstetric precautions including but not limited to vaginal bleeding, contractions, leaking of fluid and fetal movement were reviewed in detail with the patient. Please refer to After Visit Summary for other counseling recommendations.   Return in about 1 week (around 04/09/2021) for in person, ROB, High risk, cultures.  Future Appointments  Date Time Provider Department Center  04/09/2021 10:30 AM [facility] NURSE [facility]  04/09/2021 10:45 AM [facility] US4 [facility]  04/09/2021  2:45 PM Argyle Bing, MD [facility] None  04/16/2021 11:15 AM [facility] NURSE [facility]  04/16/2021 11:30 AM [facility] US3 [facility]    Catalina Antigua, MD

## 2021-04-03 ENCOUNTER — Encounter (HOSPITAL_COMMUNITY): Payer: Self-pay | Admitting: *Deleted

## 2021-04-03 ENCOUNTER — Telehealth (HOSPITAL_COMMUNITY): Payer: Self-pay | Admitting: *Deleted

## 2021-04-03 ENCOUNTER — Other Ambulatory Visit: Payer: Self-pay | Admitting: Advanced Practice Midwife

## 2021-04-03 NOTE — Telephone Encounter (Signed)
Preadmission screen  

## 2021-04-09 ENCOUNTER — Encounter: Payer: Self-pay | Admitting: *Deleted

## 2021-04-09 ENCOUNTER — Ambulatory Visit: Payer: Medicaid Other | Attending: Obstetrics and Gynecology

## 2021-04-09 ENCOUNTER — Ambulatory Visit (INDEPENDENT_AMBULATORY_CARE_PROVIDER_SITE_OTHER): Payer: Medicaid Other | Admitting: Obstetrics and Gynecology

## 2021-04-09 ENCOUNTER — Other Ambulatory Visit: Payer: Self-pay

## 2021-04-09 ENCOUNTER — Other Ambulatory Visit (HOSPITAL_COMMUNITY)
Admission: RE | Admit: 2021-04-09 | Discharge: 2021-04-09 | Disposition: A | Discharge status: Home / Self Care | Payer: Medicaid Other | Source: Ambulatory Visit | Attending: Obstetrics and Gynecology | Admitting: Obstetrics and Gynecology

## 2021-04-09 ENCOUNTER — Ambulatory Visit: Payer: Medicaid Other | Admitting: *Deleted

## 2021-04-09 VITALS — BP 108/71 | HR 92 | Wt 141.0 lb

## 2021-04-09 DIAGNOSIS — Z148 Genetic carrier of other disease: Secondary | ICD-10-CM

## 2021-04-09 DIAGNOSIS — Z3A36 36 weeks gestation of pregnancy: Secondary | ICD-10-CM

## 2021-04-09 DIAGNOSIS — O219 Vomiting of pregnancy, unspecified: Secondary | ICD-10-CM

## 2021-04-09 DIAGNOSIS — O0990 Supervision of high risk pregnancy, unspecified, unspecified trimester: Secondary | ICD-10-CM | POA: Insufficient documentation

## 2021-04-09 DIAGNOSIS — Z862 Personal history of diseases of the blood and blood-forming organs and certain disorders involving the immune mechanism: Secondary | ICD-10-CM | POA: Diagnosis not present

## 2021-04-09 DIAGNOSIS — O365930 Maternal care for other known or suspected poor fetal growth, third trimester, not applicable or unspecified: Secondary | ICD-10-CM

## 2021-04-09 DIAGNOSIS — O24419 Gestational diabetes mellitus in pregnancy, unspecified control: Secondary | ICD-10-CM

## 2021-04-09 DIAGNOSIS — Z3481 Encounter for supervision of other normal pregnancy, first trimester: Secondary | ICD-10-CM | POA: Diagnosis present

## 2021-04-09 DIAGNOSIS — D563 Thalassemia minor: Secondary | ICD-10-CM

## 2021-04-09 NOTE — Progress Notes (Signed)
    PRENATAL VISIT NOTE  Subjective:  [name] Weber is a 25 y.o. [phone] at [redacted]w[redacted]d being seen today for ongoing prenatal care.  She is currently monitored for the following issues for this high-risk pregnancy and has Sickle cell trait (HCC); Intractable nausea and vomiting; Hypokalemia; Cannabinoid hyperemesis syndrome; Alcohol abuse; Leukocytosis; Encounter for supervision of other normal pregnancy, first trimester; Former tobacco use; Alpha thalassemia silent carrier; Gestational diabetes; and IUGR (intrauterine growth restriction) affecting care of mother on their problem list.  Patient reports stable n/v of pregnancy.  Contractions: Irritability. Vag. Bleeding: None.  Movement: Present. Denies leaking of fluid.   The following portions of the patient's history were reviewed and updated as appropriate: allergies, current medications, past family history, past medical history, past social history, past surgical history and problem list.   Objective:   Vitals:   04/09/21 1504  BP: 108/71  Pulse: 92  Weight: 141 lb (64 kg)    Fetal Status: Fetal Heart Rate (bpm): 150   Movement: Present  Presentation: Vertex  General:  Alert, oriented and cooperative. Patient is in no acute distress.  Skin: Skin is warm and dry. No rash noted.   Cardiovascular: Normal heart rate noted  Respiratory: Normal respiratory effort, no problems with respiration noted  Abdomen: Soft, gravid, appropriate for gestational age.  Pain/Pressure: Present     Pelvic: Cervical exam performed in the presence of a chaperone Dilation: 1 Effacement (%): 70 Station: -3  Extremities: Normal range of motion.  Edema: None  Mental Status: Normal mood and affect. Normal behavior. Normal judgment and thought content.   Assessment and Plan:  Pregnancy: G5P3013 at [redacted]w[redacted]d 1. [redacted] weeks gestation of pregnancy  2. Poor fetal growth affecting management of mother in third trimester, single or unspecified fetus 04/09/2021: Ceph, afi  8.9, 2.4%, ac 2.8%, 8/8, normal ua dopplers Pt already set up for Saturday IOL [facility] precautions given  3. Gestational diabetes mellitus (GDM) in third trimester, gestational diabetes method of control unspecified No issues  4. Supervision of high risk pregnancy, antepartum - Strep Gp B NAA - Cervicovaginal ancillary only( [place])  5. Nausea and vomiting during pregnancy stable  Preterm labor symptoms and general obstetric precautions including but not limited to vaginal bleeding, contractions, leaking of fluid and fetal movement were reviewed in detail with the patient. Please refer to After Visit Summary for other counseling recommendations.   Return if symptoms worsen or fail to improve.  Future Appointments  Date Time Provider Department Center  04/11/2021 10:05 AM [facility]-SCREENING [facility] None  04/13/2021  9:10 AM [facility]-LD SCHED ROOM [facility] None  04/16/2021 11:15 AM [facility] NURSE [facility]  04/16/2021 11:30 AM [facility] US3 [facility]    Thornton Bing, MD

## 2021-04-09 NOTE — Progress Notes (Signed)
ROB/GBS.  C/o constipation, NV, upper flank pain 10/10 x 3 days.

## 2021-04-10 LAB — CERVICOVAGINAL ANCILLARY ONLY
Chlamydia: NEGATIVE
Comment: NEGATIVE
Comment: NORMAL
Neisseria Gonorrhea: NEGATIVE

## 2021-04-11 ENCOUNTER — Other Ambulatory Visit (HOSPITAL_COMMUNITY)
Admission: RE | Admit: 2021-04-11 | Discharge: 2021-04-11 | Disposition: A | Discharge status: Home / Self Care | Payer: Medicaid Other | Source: Ambulatory Visit | Attending: Obstetrics and Gynecology | Admitting: Obstetrics and Gynecology

## 2021-04-11 DIAGNOSIS — Z20822 Contact with and (suspected) exposure to covid-19: Secondary | ICD-10-CM | POA: Insufficient documentation

## 2021-04-11 DIAGNOSIS — Z01812 Encounter for preprocedural laboratory examination: Secondary | ICD-10-CM | POA: Insufficient documentation

## 2021-04-11 LAB — SARS CORONAVIRUS 2 (TAT 6-24 HRS): SARS Coronavirus 2: NEGATIVE

## 2021-04-11 LAB — STREP GP B NAA: Strep Gp B NAA: NEGATIVE

## 2021-04-12 ENCOUNTER — Encounter (HOSPITAL_COMMUNITY): Payer: Self-pay | Admitting: Obstetrics and Gynecology

## 2021-04-12 ENCOUNTER — Inpatient Hospital Stay (HOSPITAL_COMMUNITY)
Admission: AD | Admit: 2021-04-12 | Discharge: 2021-04-14 | DRG: 806 | Disposition: A | Discharge status: Home / Self Care | Payer: Medicaid Other | Attending: Obstetrics and Gynecology | Admitting: Obstetrics and Gynecology

## 2021-04-12 ENCOUNTER — Other Ambulatory Visit: Payer: Self-pay

## 2021-04-12 DIAGNOSIS — O365930 Maternal care for other known or suspected poor fetal growth, third trimester, not applicable or unspecified: Principal | ICD-10-CM | POA: Diagnosis present

## 2021-04-12 DIAGNOSIS — O24420 Gestational diabetes mellitus in childbirth, diet controlled: Secondary | ICD-10-CM | POA: Diagnosis present

## 2021-04-12 DIAGNOSIS — Z87891 Personal history of nicotine dependence: Secondary | ICD-10-CM | POA: Diagnosis not present

## 2021-04-12 DIAGNOSIS — E876 Hypokalemia: Secondary | ICD-10-CM | POA: Diagnosis present

## 2021-04-12 DIAGNOSIS — Z3490 Encounter for supervision of normal pregnancy, unspecified, unspecified trimester: Secondary | ICD-10-CM | POA: Diagnosis present

## 2021-04-12 DIAGNOSIS — O9902 Anemia complicating childbirth: Secondary | ICD-10-CM | POA: Diagnosis present

## 2021-04-12 DIAGNOSIS — O24419 Gestational diabetes mellitus in pregnancy, unspecified control: Secondary | ICD-10-CM | POA: Diagnosis present

## 2021-04-12 DIAGNOSIS — D573 Sickle-cell trait: Secondary | ICD-10-CM | POA: Diagnosis present

## 2021-04-12 DIAGNOSIS — Z3A36 36 weeks gestation of pregnancy: Secondary | ICD-10-CM

## 2021-04-12 DIAGNOSIS — Z3481 Encounter for supervision of other normal pregnancy, first trimester: Secondary | ICD-10-CM

## 2021-04-12 DIAGNOSIS — O26893 Other specified pregnancy related conditions, third trimester: Secondary | ICD-10-CM | POA: Diagnosis present

## 2021-04-12 DIAGNOSIS — F1290 Cannabis use, unspecified, uncomplicated: Secondary | ICD-10-CM | POA: Diagnosis present

## 2021-04-12 DIAGNOSIS — O99324 Drug use complicating childbirth: Secondary | ICD-10-CM | POA: Diagnosis present

## 2021-04-12 DIAGNOSIS — Z20822 Contact with and (suspected) exposure to covid-19: Secondary | ICD-10-CM | POA: Diagnosis present

## 2021-04-12 DIAGNOSIS — R112 Nausea with vomiting, unspecified: Secondary | ICD-10-CM | POA: Diagnosis present

## 2021-04-12 DIAGNOSIS — D563 Thalassemia minor: Secondary | ICD-10-CM | POA: Diagnosis present

## 2021-04-12 DIAGNOSIS — O365990 Maternal care for other known or suspected poor fetal growth, unspecified trimester, not applicable or unspecified: Secondary | ICD-10-CM | POA: Diagnosis present

## 2021-04-12 LAB — TYPE AND SCREEN
ABO/RH(D): A POS
Antibody Screen: NEGATIVE

## 2021-04-12 LAB — CBC
HCT: 38.1 % (ref 36.0–46.0)
Hemoglobin: 13.7 g/dL (ref 12.0–15.0)
MCH: 28.3 pg (ref 26.0–34.0)
MCHC: 36 g/dL (ref 30.0–36.0)
MCV: 78.7 fL — ABNORMAL LOW (ref 80.0–100.0)
Platelets: 242 10*3/uL (ref 150–400)
RBC: 4.84 MIL/uL (ref 3.87–5.11)
RDW: 13.2 % (ref 11.5–15.5)
WBC: 16.8 10*3/uL — ABNORMAL HIGH (ref 4.0–10.5)
nRBC: 0 % (ref 0.0–0.2)

## 2021-04-12 LAB — RPR: RPR Ser Ql: NONREACTIVE

## 2021-04-12 MED ORDER — OXYTOCIN 10 UNIT/ML IJ SOLN: 10.0000 [IU] | Freq: Once | INTRAMUSCULAR | Status: AC

## 2021-04-12 MED ORDER — ACETAMINOPHEN 325 MG PO TABS
650.0000 mg | ORAL_TABLET | ORAL | Status: DC | PRN
  Administered 2021-04-12: 650 mg via ORAL
  Filled 2021-04-12: qty 2

## 2021-04-12 MED ORDER — ONDANSETRON HCL 4 MG PO TABS
4.0000 mg | ORAL_TABLET | ORAL | Status: DC | PRN
  Administered 2021-04-13: 4 mg via ORAL
  Filled 2021-04-12: qty 1

## 2021-04-12 MED ORDER — OXYCODONE HCL 5 MG PO TABS
5.0000 mg | ORAL_TABLET | ORAL | Status: AC | PRN
  Administered 2021-04-12 – 2021-04-13 (×3): 5 mg via ORAL
  Filled 2021-04-12 (×3): qty 1

## 2021-04-12 MED ORDER — TETANUS-DIPHTH-ACELL PERTUSSIS 5-2.5-18.5 LF-MCG/0.5 IM SUSY: 0.5000 mL | PREFILLED_SYRINGE | Freq: Once | INTRAMUSCULAR | Status: DC

## 2021-04-12 MED ORDER — DIPHENHYDRAMINE HCL 25 MG PO CAPS: 25.0000 mg | ORAL_CAPSULE | Freq: Four times a day (QID) | ORAL | Status: DC | PRN

## 2021-04-12 MED ORDER — PRENATAL MULTIVITAMIN CH
1.0000 | ORAL_TABLET | Freq: Every day | ORAL | Status: DC
  Administered 2021-04-12 – 2021-04-13 (×2): 1 via ORAL
  Filled 2021-04-12 (×2): qty 1

## 2021-04-12 MED ORDER — ACETAMINOPHEN 325 MG PO TABS
650.0000 mg | ORAL_TABLET | ORAL | Status: DC | PRN
  Administered 2021-04-12 – 2021-04-13 (×3): 650 mg via ORAL
  Filled 2021-04-12 (×3): qty 2

## 2021-04-12 MED ORDER — MEASLES, MUMPS & RUBELLA VAC IJ SOLR: 0.5000 mL | Freq: Once | INTRAMUSCULAR | Status: DC

## 2021-04-12 MED ORDER — TRANEXAMIC ACID-NACL 1000-0.7 MG/100ML-% IV SOLN
INTRAVENOUS | Status: AC
  Administered 2021-04-12: 1000 mg via INTRAVENOUS
  Filled 2021-04-12: qty 100

## 2021-04-12 MED ORDER — DIBUCAINE (PERIANAL) 1 % EX OINT: 1.0000 "application " | TOPICAL_OINTMENT | CUTANEOUS | Status: DC | PRN

## 2021-04-12 MED ORDER — ONDANSETRON HCL 4 MG/2ML IJ SOLN: 4.0000 mg | Freq: Four times a day (QID) | INTRAMUSCULAR | Status: DC | PRN

## 2021-04-12 MED ORDER — ONDANSETRON HCL 4 MG/2ML IJ SOLN: 4.0000 mg | INTRAMUSCULAR | Status: DC | PRN

## 2021-04-12 MED ORDER — TRANEXAMIC ACID-NACL 1000-0.7 MG/100ML-% IV SOLN: 1000.0000 mg | INTRAVENOUS | Status: AC

## 2021-04-12 MED ORDER — LACTATED RINGERS IV SOLN: 500.0000 mL | INTRAVENOUS | Status: DC | PRN

## 2021-04-12 MED ORDER — LIDOCAINE HCL (PF) 1 % IJ SOLN
30.0000 mL | INTRAMUSCULAR | Status: DC | PRN
  Filled 2021-04-12: qty 30

## 2021-04-12 MED ORDER — LACTATED RINGERS IV SOLN: INTRAVENOUS | Status: DC

## 2021-04-12 MED ORDER — WITCH HAZEL-GLYCERIN EX PADS: 1.0000 "application " | MEDICATED_PAD | CUTANEOUS | Status: DC | PRN

## 2021-04-12 MED ORDER — SOD CITRATE-CITRIC ACID 500-334 MG/5ML PO SOLN: 30.0000 mL | ORAL | Status: DC | PRN

## 2021-04-12 MED ORDER — OXYTOCIN-SODIUM CHLORIDE 30-0.9 UT/500ML-% IV SOLN
2.5000 [IU]/h | INTRAVENOUS | Status: DC
  Administered 2021-04-12: 2.5 [IU]/h via INTRAVENOUS

## 2021-04-12 MED ORDER — SENNOSIDES-DOCUSATE SODIUM 8.6-50 MG PO TABS
2.0000 | ORAL_TABLET | ORAL | Status: DC
  Administered 2021-04-12 – 2021-04-13 (×2): 2 via ORAL
  Filled 2021-04-12 (×2): qty 2

## 2021-04-12 MED ORDER — OXYCODONE-ACETAMINOPHEN 5-325 MG PO TABS: 2.0000 | ORAL_TABLET | ORAL | Status: DC | PRN

## 2021-04-12 MED ORDER — OXYTOCIN BOLUS FROM INFUSION: 333.0000 mL | Freq: Once | INTRAVENOUS | Status: AC

## 2021-04-12 MED ORDER — BENZOCAINE-MENTHOL 20-0.5 % EX AERO
1.0000 "application " | INHALATION_SPRAY | CUTANEOUS | Status: DC | PRN
  Administered 2021-04-12: 1 via TOPICAL
  Filled 2021-04-12: qty 56

## 2021-04-12 MED ORDER — OXYTOCIN 10 UNIT/ML IJ SOLN
INTRAMUSCULAR | Status: AC
  Administered 2021-04-12: 10 [IU] via INTRAMUSCULAR
  Filled 2021-04-12: qty 1

## 2021-04-12 MED ORDER — OXYTOCIN-SODIUM CHLORIDE 30-0.9 UT/500ML-% IV SOLN
INTRAVENOUS | Status: AC
  Administered 2021-04-12: 333 mL via INTRAVENOUS
  Filled 2021-04-12: qty 500

## 2021-04-12 MED ORDER — SIMETHICONE 80 MG PO CHEW: 80.0000 mg | CHEWABLE_TABLET | ORAL | Status: DC | PRN

## 2021-04-12 MED ORDER — COCONUT OIL OIL: 1.0000 "application " | TOPICAL_OIL | Status: DC | PRN

## 2021-04-12 MED ORDER — IBUPROFEN 600 MG PO TABS
600.0000 mg | ORAL_TABLET | Freq: Four times a day (QID) | ORAL | Status: DC
  Administered 2021-04-12 – 2021-04-14 (×9): 600 mg via ORAL
  Filled 2021-04-12 (×9): qty 1

## 2021-04-12 MED ORDER — OXYCODONE-ACETAMINOPHEN 5-325 MG PO TABS: 1.0000 | ORAL_TABLET | ORAL | Status: DC | PRN

## 2021-04-12 NOTE — Discharge Instructions (Signed)

## 2021-04-12 NOTE — Clinical Social Work Maternal (Signed)
CLINICAL SOCIAL WORK MATERNAL/CHILD NOTE  Patient Details  Name: [name] Weber MRN: [id number] Date of Birth: 06/15/1996  Date:  04/12/2021  Clinical Social Worker Initiating Note:  Kimla Furth, MSW, LCSWA Date/Time: Initiated:  04/12/21/0915     Child's Name:  [name] Weber   Biological Parents:  Mother,Father ([name] Weber 07/29/1983)   Need for Interpreter:  None   Reason for Referral:  Behavioral Health Concerns,Current Substance Use/Substance Use During Pregnancy    Address:  [address] [place] [address]  FO[place] Address: [address] [place], [place]   Phone number:  [phone] (home)     Additional phone number:   Household Members/Support Persons (HM/SP):   Household Member/Support Person 1,Household Member/Support Person 2,Household Member/Support Person 3   HM/SP Name Relationship DOB or Age  HM/SP -1 Jakayla Karges Daughter 03/14/2011  HM/SP -2 Jaylah Muma Daughter 08/20/2013  HM/SP -3 Jakai Mccombie Son 10/06/2014  HM/SP -4        HM/SP -5        HM/SP -6        HM/SP -7        HM/SP -8          Natural Supports (not living in the home):  Immediate Family,Spouse/significant other   Professional Supports: Therapist   Employment: Unemployed   Type of Work:     Education:  9 to 11 years   Homebound arranged:    Financial Resources:  Medicaid   Other Resources:  Food Stamps    Cultural/Religious Considerations Which May Impact Care:    Strengths:  Ability to meet basic needs ,Home prepared for child ,Pediatrician chosen   Psychotropic Medications:         Pediatrician:    [place] area  Pediatrician List:   [place] Other ([facility] [place])  [place]    [place]    [place]    [place]    [place]      Pediatrician Fax Number:    Risk Factors/Current Problems:  Substance Use ,Mental Health Concerns    Cognitive State:  Alert ,Linear Thinking    Mood/Affect:   Happy ,Bright ,Interested ,Comfortable ,Calm    CSW Assessment: CSW consulted for THC use and hx of ETOH abuse. CSW met with MOB to complete assessment and offer support. CSW introduced self and role. CSW observed FOB [name] sleeping on the couch and MOB holding infant. CSW offered to return to speak with MOB in private. MOB declined and stated anything can be discussed with FOB present. MOB was pleasant, engaged and receptive to the visit. CSW informed MOB of the reason for consult and assessed her current mood. MOB reported she is feeling better with infant here. MOB disclosed she experienced sickness, anxiety and some depression during the pregnancy. CSW asked MOB how she coped with the feelings. MOB shared she utilizes her prescription Prozac 20mg and Seroquel. MOB also expressed she listens to music, takes bubble maths and takes time for herself to cope. CSW commended MOB on utilizing coping mechanisms. MOB reported [facility] prescribes her medication and she attends tele-therapy with them periodically which is "a little helpful." MOB disclosed she was diagnosed with anxiety and bipolar depression in 2020, following the death of her children's father. MOB identified her current partner and her aunt as primary supports. MOB denies any current SI, HI or being involved in DV.   CSW discussed MOB substance use. MOB disclosed she last smoked THC in November   to help with anxiety and appetite. MOB reported THC helps her with calmness. MOB denies any additional substance or alcohol use. MOB stated she stopped drinking alcohol prior to being pregnant, due to it causing her stomach issues. CSW informed MOB of the hospital drug screen policy. MOB aware infant's UDS is positive for THC and the CDS will be followed. CSW informed MOB that a CPS report will be made with [place]. MOB was understanding and denies any previous CPS history and denied any questions. CSW also confirmed no previous history with  [place] CPS. MOB reported she lives with her three children. MOB stated they are currently being watched by her aunt. MOB stated FOB lives at a different address. MOB is unemployed and receives food stamp benefits. MOB expressed she is interested in WIC. CSW provided MOB with information to apply for WIC.    CSW provided education regarding the baby blues period versus PPD and provided resources. CSW provided the New Mom Checklist and encouraged MOB to self evaluate and contact a medical professional if symptoms are noted at any time. MOB was receptive to resources and disclosed she experienced PPD following the birth of her son in 2015. MOB reported she had a lot of crying, which only lasted 2-3 weeks. CSW provided review of Sudden Infant Death Syndrome (SIDS) precautions. MOB stated she has all essentials, including a bassinet and car seat. MOB denies any barriers to follow-up care. MOB expressed no additional needs at this time.    CSW filed report with [place] DSS. CSW will continue to follow CDS and make an additional CPS report if warranted. CSW identifies no further need for intervention and no barriers to discharge at this time.  CSW Plan/Description:  No Further Intervention Required/No Barriers to Discharge,Perinatal Mood and Anxiety Disorder (PMADs) Education,Sudden Infant Death Syndrome (SIDS) Education,Other Information/Referral to Community Resources,Hospital Drug Screen Policy Information,Child Protective Service Report ,CSW Will Continue to Monitor Umbilical Cord Tissue Drug Screen Results and Make Report if Warranted,Other Patient/Family Education    Ammanda Dobbins J Sasha Rogel, LCSWA 04/12/2021, 9:38 AM 

## 2021-04-12 NOTE — H&P (Signed)
OBSTETRIC ADMISSION HISTORY AND PHYSICAL  [name] Weber is a 25 y.o. female [phone] with IUP at [redacted]w[redacted]d by 10 wk u/s presenting for SOL. She reports +FMs, No LOF, no VB, no blurry vision, headaches or peripheral edema, and RUQ pain.  She plans on bottle feeding. She request depo vs nexplanon for birth control. 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 Chlamydia infection   . GDM (gestational diabetes mellitus) 02/2021  . Trichomonas 01/2012  . UTI (lower urinary tract infection)     Past Surgical History: Past Surgical History:  Procedure Laterality Date  . DRUG INDUCED ENDOSCOPY    . NO PAST SURGERIES      Obstetrical History: OB History    Gravida  5   Para  3   Term  3   Preterm      AB  1   Living  3     SAB  1   IAB  0   Ectopic      Multiple      Live Births  3           Social History Social History   Socioeconomic History  . Marital status: Married    Spouse name: Not on file  . Number of children: Not on file  . Years of education: Not on file  . Highest education level: Not on file  Occupational History  . Not on file  Tobacco Use  . Smoking status: Former Smoker    Packs/day: 0.50    Types: Cigarettes    Quit date: 10/22/2020    Years since quitting: 0.4  . Smokeless tobacco: Never Used  Vaping Use  . Vaping Use: Never used  Substance and Sexual Activity  . Alcohol use: Not Currently  . Drug use: Yes    Types: Marijuana    Comment: Last used early October  . Sexual activity: Yes     Partners: Male    Birth control/protection: None  Other Topics Concern  . Not on file  Social History Narrative  . Not on file   Social Determinants of Health   Financial Resource Strain: Not on file  Food Insecurity: Not on file  Transportation Needs: Not on file  Physical Activity: Not on file  Stress: Not on file  Social Connections: Not on file    Family History: Family History  Problem Relation Age of Onset  . Diabetes Maternal Aunt   . Healthy Mother   . Healthy Father   . Alcohol abuse Neg Hx   . Arthritis Neg Hx   . Asthma Neg Hx   . Birth defects Neg Hx   . Cancer Neg Hx   . COPD Neg Hx   . Depression Neg Hx   . Drug abuse Neg Hx   . Early death Neg Hx   . Hearing loss Neg Hx   . Heart disease Neg Hx   . Hyperlipidemia Neg Hx   . Hypertension Neg Hx   . Kidney disease Neg Hx   . Learning disabilities Neg Hx   .  Mental illness Neg Hx   . Mental retardation Neg Hx   . Miscarriages / Stillbirths Neg Hx   . Stroke Neg Hx   . Vision loss Neg Hx     Allergies: No Known Allergies  Medications Prior to Admission  Medication Sig Dispense Refill Last Dose  . Accu-Chek Softclix Lancets lancets Please check blood sugars up to four times per day. 100 each 0   . Blood Pressure Monitoring (BLOOD PRESSURE KIT) DEVI 1 kit by Does not apply route once a week. 1 each 0   . cyclobenzaprine (FLEXERIL) 10 MG tablet Take 0.5-1 tablets (5-10 mg total) by mouth 3 (three) times daily as needed for muscle spasms. (Patient not taking: Reported on 04/02/2021) 20 tablet 0   . docusate calcium (SURFAK) 240 MG capsule Take 1 capsule (240 mg total) by mouth 2 (two) times daily. (Patient not taking: No sig reported) 60 capsule 2   . Doxylamine-Pyridoxine (DICLEGIS) 10-10 MG TBEC 1 tab in AM, 1 tab mid afternoon 2 tabs at bedtime. Max dose 4 tabs daily. 100 tablet 5   . FLUoxetine (PROZAC) 20 MG capsule Take 40 mg by mouth daily.  (Patient not taking: No sig reported)     . glucose blood  test strip Use as instructed 100 each 12   . metoCLOPramide (REGLAN) 10 MG tablet Take 1 tablet (10 mg total) by mouth 3 (three) times daily with meals as needed for up to 15 days for nausea or vomiting. 30 tablet 1   . ondansetron (ZOFRAN ODT) 8 MG disintegrating tablet Take 1 tablet (8 mg total) by mouth every 8 (eight) hours as needed for nausea or vomiting. (Patient not taking: No sig reported) 20 tablet 3   . pantoprazole (PROTONIX) 20 MG tablet Take 1 tablet (20 mg total) by mouth daily. (Patient not taking: No sig reported) 30 tablet 1   . Prenatal Vit-Fe Fumarate-FA (PRENATAL MULTIVITAMIN) TABS tablet Take 1 tablet by mouth daily at 12 noon.     . promethazine (PHENERGAN) 12.5 MG suppository Place 1 suppository (12.5 mg total) rectally every 6 (six) hours as needed for nausea or vomiting. (Patient not taking: No sig reported) 12 each 3   . promethazine (PHENERGAN) 25 MG tablet Take 0.5-1 tablets (12.5-25 mg total) by mouth every 6 (six) hours as needed for nausea or vomiting. (Patient not taking: No sig reported) 30 tablet 3   . QUEtiapine (SEROQUEL) 50 MG tablet Take 50 mg by mouth at bedtime. (Patient not taking: No sig reported)     . scopolamine (TRANSDERM-SCOP) 1 MG/3DAYS Place 1 patch (1.5 mg total) onto the skin every 3 (three) days. (Patient not taking: No sig reported) 10 patch 0      Review of Systems   All systems reviewed and negative except as stated in HPI  Last menstrual period 07/28/2020, unknown if currently breastfeeding. General appearance: alert, cooperative and severe distress Lungs: normal respiratory effort Heart: regular rate and rhythm Abdomen: soft, non-tender; gravid Pelvic: as noted below Extremities: Homans sign is negative, no sign of DVT Presentation: cephalic by cervical exam  Dilation: 10 Exam by:: Sylvester Harder, MD   Prenatal labs: ABO, Rh: A/Positive/-- (10/27 1020) Antibody: Negative (10/27 1020) Rubella: 20.50 (10/27 1020) RPR: Non Reactive  (02/16 1016)  HBsAg: Negative (10/27 1020)  HIV: Non Reactive (02/16 1016)  GBS: Negative/-- (04/19 0327)  2 hr Glucola passed Genetic screening silent alpha thal carrier, hemoglobin C trait Anatomy US normal  Prenatal Transfer Tool  Maternal  Diabetes: Yes:  Diabetes Type:  Diet controlled Genetic Screening: Normal, except as noted above Maternal Ultrasounds/Referrals: IUGR Fetal Ultrasounds or other Referrals:  Referred to Materal Fetal Medicine  Maternal Substance Abuse:  Yes:  Type: Smoker, Marijuana Significant Maternal Medications:  None Significant Maternal Lab Results: Group B Strep negative  Results for orders placed or performed during the hospital encounter of 04/11/21 (from the past 24 hour(s))  SARS CORONAVIRUS 2 (TAT 6-24 HRS) Nasopharyngeal Nasopharyngeal Swab   Collection Time: 04/11/21 10:01 AM   Specimen: Nasopharyngeal Swab  Result Value Ref Range   SARS Coronavirus 2 NEGATIVE NEGATIVE    Patient Active Problem List   Diagnosis Date Noted  . Encounter for induction of labor 04/12/2021  . Gestational diabetes 03/07/2021  . IUGR (intrauterine growth restriction) affecting care of mother 03/07/2021  . Alpha thalassemia silent carrier 10/29/2020  . Former tobacco use 10/18/2020  . Encounter for supervision of other normal pregnancy, first trimester 10/10/2020  . Leukocytosis 10/04/2020  . Cannabinoid hyperemesis syndrome 11/22/2018  . Alcohol abuse 11/22/2018  . Intractable nausea and vomiting 11/21/2018  . Hypokalemia 11/21/2018  . Sickle cell trait ([place]) 07/03/2014    Assessment/Plan:  [name] Weber is a 25 y.o. [id number] at 2w6dhere for SOL.  #Labor: Patient presents to L&D complete and pushing and had an uncomplicated delivery. #Pain: none #FWB: n/a #ID: GBS neg #MOF: bottle #MOC: nexplanon vs depo #Circ: n/a #THC use: SW postpartum #A1GDM: FBGL 47/06AM  AArrie Senate MD  04/12/2021, 1:42 AM

## 2021-04-12 NOTE — Discharge Summary (Signed)
Postpartum Discharge Summary  Date of Service updated 04/14/21     Patient Name: [name] Weber DOB: 1996/06/10 MRN: [id number]  Date of admission: 04/12/2021 Delivery date:04/12/2021  Delivering provider: Arrie Senate  Date of discharge: 04/14/2021  Admitting diagnosis: Encounter for induction of labor [Z34.90] Intrauterine pregnancy: [redacted]w[redacted]d    Secondary diagnosis:  Active Problems:   Sickle cell trait (HCC)   Intractable nausea and vomiting   Hypokalemia   Cannabinoid hyperemesis syndrome   Encounter for supervision of other normal pregnancy, first trimester   Former tobacco use   Alpha thalassemia silent carrier   Gestational diabetes   IUGR (intrauterine growth restriction) affecting care of mother   Encounter for induction of labor   Vaginal delivery  Additional problems: none    Discharge diagnosis: Preterm Pregnancy Delivered and GDM A1                                              Post partum procedures:depo postpartum Augmentation: AROM Complications: None  Hospital course: Onset of Labor With Vaginal Delivery      25y.o. yo [id number] admitted in Active Labor on 04/12/2021. Patient had an uncomplicated labor course as follows:  Membrane Rupture Time/Date: 1:17 AM ,04/12/2021   Delivery Method:Vaginal, Spontaneous  Episiotomy: None  Lacerations:  None  Patient had an uncomplicated postpartum course.  She is ambulating, tolerating a regular diet, passing flatus, and urinating well. Patient is discharged home in stable condition on 04/14/21.  Newborn Data: Birth date:04/12/2021  Birth time:1:17 AM  Gender:Female  Living status:Living  Apgars:8 ,9  [phone]   Magnesium Sulfate received: No BMZ received: No Rhophylac:N/A MMR:N/A T-DaP:Given prenatally Flu: Yes Transfusion:No  Physical exam  Vitals:   04/13/21 0525 04/13/21 1400 04/13/21 2024 04/14/21 0513  BP: 116/76 126/90 122/90 124/84  Pulse:  67 77 85  Resp: 18 19 18 18    Temp: 97.8 F (36.6 C) 98.3 F (36.8 C) 98.4 F (36.9 C) 97.9 F (36.6 C)  TempSrc: Oral   Oral  SpO2:   (!) 1%   Weight:      Height:       General: alert, cooperative and no distress Lochia: appropriate Uterine Fundus: firm Incision: N/A DVT Evaluation: No evidence of DVT seen on physical exam. Labs: Lab Results  Component Value Date   WBC 16.8 (H) 04/12/2021   HGB 13.7 04/12/2021   HCT 38.1 04/12/2021   MCV 78.7 (L) 04/12/2021   PLT 242 04/12/2021   CMP Latest Ref Rng & Units 11/06/2020  Glucose 70 - 99 mg/dL 109(H)  BUN 6 - 20 mg/dL 6  Creatinine 0.44 - 1.00 mg/dL 0.65  Sodium 135 - 145 mmol/L 138  Potassium 3.5 - 5.1 mmol/L 3.4(L)  Chloride 98 - 111 mmol/L 104  CO2 22 - 32 mmol/L 24  Calcium 8.9 - 10.3 mg/dL 9.6  Total Protein 6.5 - 8.1 g/dL 6.6  Total Bilirubin 0.3 - 1.2 mg/dL 0.6  Alkaline Phos 38 - 126 U/L 33(L)  AST 15 - 41 U/L 16  ALT 0 - 44 U/L 12   Edinburgh Score: Edinburgh Postnatal Depression Scale Screening Tool 04/12/2021  I have been able to laugh and see the funny side of things. 0  I have looked forward with enjoyment to things. 0  I have blamed myself unnecessarily when  things went wrong. 2  I have been anxious or worried for no good reason. 0  I have felt scared or panicky for no good reason. 0  Things have been getting on top of me. 0  I have been so unhappy that I have had difficulty sleeping. 0  I have felt sad or miserable. 0  I have been so unhappy that I have been crying. 0  The thought of harming myself has occurred to me. 0  Edinburgh Postnatal Depression Scale Total 2     After visit meds:  Allergies as of 04/14/2021   No Known Allergies     Medication List    STOP taking these medications   Accu-Chek Softclix Lancets lancets   cyclobenzaprine 10 MG tablet Commonly known as: FLEXERIL   Doxylamine-Pyridoxine 10-10 MG Tbec Commonly known as: Diclegis   glucose blood test strip   metoCLOPramide 10 MG tablet Commonly  known as: REGLAN   ondansetron 8 MG disintegrating tablet Commonly known as: Zofran ODT   pantoprazole 20 MG tablet Commonly known as: Protonix   promethazine 12.5 MG suppository Commonly known as: Phenergan   promethazine 25 MG tablet Commonly known as: PHENERGAN   scopolamine 1 MG/3DAYS Commonly known as: TRANSDERM-SCOP     TAKE these medications   acetaminophen 500 MG tablet Commonly known as: TYLENOL Take 2 tablets (1,000 mg total) by mouth every 6 (six) hours as needed (for pain scale < 4).   Blood Pressure Kit Devi 1 kit by Does not apply route once a week.   coconut oil Oil Apply 1 application topically as needed.   docusate calcium 240 MG capsule Commonly known as: SURFAK Take 1 capsule (240 mg total) by mouth 2 (two) times daily.   FLUoxetine 20 MG capsule Commonly known as: PROZAC Take 40 mg by mouth daily.   ibuprofen 600 MG tablet Commonly known as: ADVIL Take 1 tablet (600 mg total) by mouth every 6 (six) hours as needed.   prenatal multivitamin Tabs tablet Take 1 tablet by mouth daily at 12 noon.   QUEtiapine 50 MG tablet Commonly known as: SEROQUEL Take 50 mg by mouth at bedtime.        Discharge home in stable condition Infant Feeding: Bottle Infant Disposition:home with mother Discharge instruction: per After Visit Summary and Postpartum booklet. Activity: Advance as tolerated. Pelvic rest for 6 weeks.  Diet: routine diet Future Appointments: Future Appointments  Date Time Provider [place]  05/24/2021  9:15 AM [facility] None  05/24/2021 10:15 AM Chancy Milroy, MD [place] None   Follow up Visit: Message sent to [facility] 04/12/21 by Sylvester Harder.   Please schedule this patient for a In person postpartum visit in 6 weeks with the following provider: Any provider. Additional Postpartum F/U:2 hour GTT  High risk pregnancy complicated by: GDM Delivery mode:  Vaginal, Spontaneous  Anticipated Birth Control:   Depo   04/04/2439 Arrie Senate, MD

## 2021-04-12 NOTE — Progress Notes (Addendum)
Pt came into MAU lobby reporting she had to push. Pt was placed in room 30 and cervix was checked by Collene Gobble, CNM. Pt was BBOW +2. LD charge RN called and pt transported to 214 w/ L. [place] CNM and Johna Roles RN. FHT obtained w/ HB around 145.

## 2021-04-13 ENCOUNTER — Inpatient Hospital Stay (HOSPITAL_COMMUNITY)
Admission: AD | Admit: 2021-04-13 | Payer: Medicaid Other | Source: Home / Self Care | Admitting: Obstetrics and Gynecology

## 2021-04-13 ENCOUNTER — Inpatient Hospital Stay (HOSPITAL_COMMUNITY): Payer: Medicaid Other

## 2021-04-13 ENCOUNTER — Encounter (HOSPITAL_COMMUNITY): Payer: Self-pay | Admitting: Obstetrics and Gynecology

## 2021-04-13 LAB — GLUCOSE, CAPILLARY: Glucose-Capillary: 78 mg/dL (ref 70–99)

## 2021-04-13 LAB — RAPID URINE DRUG SCREEN, HOSP PERFORMED
Amphetamines: NOT DETECTED
Barbiturates: NOT DETECTED
Benzodiazepines: NOT DETECTED
Cocaine: NOT DETECTED
Opiates: NOT DETECTED
Tetrahydrocannabinol: POSITIVE — AB

## 2021-04-13 MED ORDER — MEDROXYPROGESTERONE ACETATE 150 MG/ML IM SUSP
150.0000 mg | Freq: Once | INTRAMUSCULAR | Status: AC
  Administered 2021-04-13: 150 mg via INTRAMUSCULAR
  Filled 2021-04-13: qty 1

## 2021-04-13 NOTE — Progress Notes (Signed)
Patient ID: [name] Weber, female   DOB: 1996-03-29, 25 y.o.   MRN: [id number]  POSTPARTUM PROGRESS NOTE  Post Partum Day 1  Subjective:  [name] Weber is a 25 y.o. [id number] s/p SVD at [redacted]w[redacted]d.  No acute events overnight.  Pt denies problems with ambulating, voiding or po intake.  She denies nausea or vomiting.  Pain is well controlled.  She has had flatus. She has had bowel movement.  Lochia Minimal.   Objective: Blood pressure 116/76, pulse 78, temperature 97.8 F (36.6 C), temperature source Oral, resp. rate 18, height 5\' 3"  (1.6 m), weight 64 kg, last menstrual period 07/28/2020, SpO2 99 %, unknown if currently breastfeeding.  Physical Exam:  General: alert, cooperative and no distress Chest: no respiratory distress Heart:regular rate, distal pulses intact Abdomen: soft, nontender,  Uterine Fundus: firm, appropriately tender DVT Evaluation: No calf swelling or tenderness Extremities: Negative edema Skin: warm, dry  Recent Labs    04/12/21 0132  HGB 13.7  HCT 38.1    Assessment/Plan: [name] Weber is a 25 y.o. 25 s/p SVD at [redacted]w[redacted]d   PPD#1 - Doing well Contraception: Depo IP Feeding: Bottle Dispo: Plan for discharge tomorrow  # poor feeding in infant, baby likely will not be dc'd by peds today.    LOS: 1 day   [redacted]w[redacted]d I, NP 04/13/2021, 11:28 AM

## 2021-04-13 NOTE — Progress Notes (Signed)
Pt's chart flagging that pneumonia screen is missing, unable to document screen because screening questions not found.  Pt states she smokes 4-5 cigarette's a day, has no history of pneumonia and no history of asthma and no other risk factors. Pt states received two Covid vaccines.  Advised patient to speak with MD and or follow up with her PCP in regards to pneumonia vaccine.

## 2021-04-13 NOTE — Plan of Care (Signed)
  Problem: Education: Goal: Knowledge of condition will improve Outcome: Completed/Met   Problem: Activity: Goal: Will verbalize the importance of balancing activity with adequate rest periods Outcome: Completed/Met Goal: Ability to tolerate increased activity will improve Outcome: Completed/Met   Problem: Life Cycle: Goal: Chance of risk for complications during the postpartum period will decrease Outcome: Completed/Met   Problem: Role Relationship: Goal: Ability to demonstrate positive interaction with newborn will improve Outcome: Completed/Met

## 2021-04-14 MED ORDER — ACETAMINOPHEN 500 MG PO TABS: 1000.0000 mg | ORAL_TABLET | Freq: Four times a day (QID) | ORAL | Status: DC | PRN

## 2021-04-14 MED ORDER — OXYCODONE HCL 5 MG PO TABS
5.0000 mg | ORAL_TABLET | Freq: Once | ORAL | Status: AC
  Administered 2021-04-14: 5 mg via ORAL
  Filled 2021-04-14: qty 1

## 2021-04-14 MED ORDER — PNEUMOCOCCAL VAC POLYVALENT 25 MCG/0.5ML IJ INJ: 0.5000 mL | INJECTION | Freq: Once | INTRAMUSCULAR | Status: DC

## 2021-04-14 MED ORDER — COCONUT OIL OIL: 1.0000 "application " | TOPICAL_OIL | 0 refills | Status: DC | PRN

## 2021-04-14 MED ORDER — IBUPROFEN 600 MG PO TABS: 600.0000 mg | ORAL_TABLET | Freq: Four times a day (QID) | ORAL | 0 refills | Status: DC | PRN

## 2021-04-14 NOTE — Progress Notes (Signed)
Patient is a smoker and previous RN spoke to her about the pneumonia vaccine. After speaking with her provider, patient requests the pneumonia vaccine. Order placed per protocol and patient to receive vaccine prior to discharge. Earl Gala, Linda [facility] [place]

## 2021-04-14 NOTE — Plan of Care (Signed)
  Problem: Education: ?Goal: Knowledge of General Education information will improve ?Description: Including pain rating scale, medication(s)/side effects and non-pharmacologic comfort measures ?Outcome: Completed/Met ?  ?Problem: Clinical Measurements: ?Goal: Ability to maintain clinical measurements within normal limits will improve ?Outcome: Completed/Met ?Goal: Will remain free from infection ?Outcome: Completed/Met ?Goal: Diagnostic test results will improve ?Outcome: Completed/Met ?Goal: Respiratory complications will improve ?Outcome: Completed/Met ?Goal: Cardiovascular complication will be avoided ?Outcome: Completed/Met ?  ?Problem: Activity: ?Goal: Risk for activity intolerance will decrease ?Outcome: Completed/Met ?  ?Problem: Nutrition: ?Goal: Adequate nutrition will be maintained ?Outcome: Completed/Met ?  ?Problem: Elimination: ?Goal: Will not experience complications related to bowel motility ?Outcome: Completed/Met ?Goal: Will not experience complications related to urinary retention ?Outcome: Completed/Met ?  ?Problem: Pain Managment: ?Goal: General experience of comfort will improve ?Outcome: Completed/Met ?  ?Problem: Safety: ?Goal: Ability to remain free from injury will improve ?Outcome: Completed/Met ?  ?Problem: Skin Integrity: ?Goal: Risk for impaired skin integrity will decrease ?Outcome: Completed/Met ?  ?Problem: Education: ?Goal: Knowledge of General Education information will improve ?Description: Including pain rating scale, medication(s)/side effects and non-pharmacologic comfort measures ?Outcome: Completed/Met ?  ?Problem: Clinical Measurements: ?Goal: Ability to maintain clinical measurements within normal limits will improve ?Outcome: Completed/Met ?Goal: Will remain free from infection ?Outcome: Completed/Met ?Goal: Diagnostic test results will improve ?Outcome: Completed/Met ?Goal: Respiratory complications will improve ?Outcome: Completed/Met ?Goal: Cardiovascular complication  will be avoided ?Outcome: Completed/Met ?  ?Problem: Activity: ?Goal: Risk for activity intolerance will decrease ?Outcome: Completed/Met ?  ?Problem: Nutrition: ?Goal: Adequate nutrition will be maintained ?Outcome: Completed/Met ?  ?Problem: Elimination: ?Goal: Will not experience complications related to bowel motility ?Outcome: Completed/Met ?Goal: Will not experience complications related to urinary retention ?Outcome: Completed/Met ?  ?Problem: Pain Managment: ?Goal: General experience of comfort will improve ?Outcome: Completed/Met ?  ?Problem: Safety: ?Goal: Ability to remain free from injury will improve ?Outcome: Completed/Met ?  ?Problem: Skin Integrity: ?Goal: Risk for impaired skin integrity will decrease ?Outcome: Completed/Met ?  ?

## 2021-04-14 NOTE — Progress Notes (Signed)
Pharmacist called regarding the fact that patient received pneumonia vaccine in 2015. Pharmacist stated she would call MD to decide if patient really needs vaccine and would discontinue order if patient did not need vaccine again. Pharmacist discontinued vaccine order. Earl Gala, Linda Hedges [place]

## 2021-04-16 ENCOUNTER — Other Ambulatory Visit: Payer: Medicaid Other

## 2021-04-16 ENCOUNTER — Ambulatory Visit: Payer: Medicaid Other

## 2021-05-04 ENCOUNTER — Inpatient Hospital Stay (HOSPITAL_COMMUNITY): Admit: 2021-05-04 | Payer: Self-pay

## 2021-05-24 ENCOUNTER — Other Ambulatory Visit: Payer: Self-pay

## 2021-05-24 ENCOUNTER — Ambulatory Visit (INDEPENDENT_AMBULATORY_CARE_PROVIDER_SITE_OTHER): Payer: Medicaid Other | Admitting: Obstetrics and Gynecology

## 2021-05-24 ENCOUNTER — Encounter: Payer: Self-pay | Admitting: Obstetrics and Gynecology

## 2021-05-24 ENCOUNTER — Other Ambulatory Visit: Payer: Medicaid Other

## 2021-05-24 VITALS — BP 116/78 | HR 74 | Wt 128.0 lb

## 2021-05-24 DIAGNOSIS — O24419 Gestational diabetes mellitus in pregnancy, unspecified control: Secondary | ICD-10-CM

## 2021-05-24 DIAGNOSIS — N719 Inflammatory disease of uterus, unspecified: Secondary | ICD-10-CM | POA: Diagnosis not present

## 2021-05-24 MED ORDER — AMOXICILLIN-POT CLAVULANATE 875-125 MG PO TABS: 1.0000 | ORAL_TABLET | Freq: Two times a day (BID) | ORAL | 0 refills | Status: DC

## 2021-05-24 MED ORDER — ONDANSETRON 8 MG PO TBDP: 8.0000 mg | ORAL_TABLET | Freq: Three times a day (TID) | ORAL | 0 refills | Status: DC | PRN

## 2021-05-24 NOTE — Progress Notes (Signed)
Post Partum Visit Note  [name] Weber is a 25 y.o. [phone] female who presents for a postpartum visit. She is 6 weeks postpartum following a vaginal of delivery:.  I have fully reviewed the prenatal and intrapartum course. The delivery was at 36.6  gestational weeks.  Anesthesia: natural Postpartum course has been uncomplicated. Baby is doing well. Baby is feeding by bottle. Bleeding yes. Bowel function is normal. Bladder function is normal. Patient  sexually active. Contraception method is depo-provera. Postpartum depression screening:  Negative  Continues to have bright red vaginal bleeding with clots, along with lower abdominal cramping that occurs everyday.  She has not been sexually active.  Reports bleeding never improved PP.   The pregnancy intention screening data noted above was reviewed. Potential methods of contraception were discussed. The patient elected to proceed with Hormonal Implant.     Health Maintenance Due  Topic Date Due  . Pneumococcal Vaccine 73-5 Years old (1 of 4 - PCV13) Never done  . URINE MICROALBUMIN  Never done  . HPV VACCINES (1 - 2-dose series) Never done  . COVID-19 Vaccine (3 - Booster) 02/03/2021    The following portions of the patient's history were reviewed and updated as appropriate: allergies, current medications, past family history, past medical history, past social history, past surgical history and problem list.  Review of Systems Pertinent items are noted in HPI.  Objective:  BP 116/78   Pulse 74   Wt 128 lb (58.1 kg)   LMP 07/28/2020   BMI 22.67 kg/m    General:  alert and cooperative  Lungs: clear to auscultation bilaterally  Heart:  regular rate and rhythm, S1, S2 normal, no murmur, click, rub or gallop  Abdomen: Soft, generalized tenderness. No rebound or guarding.   GU exam:  abnormal + uterin tenderness with bimanual exam. No odor. small-moderate amount of blood noted on exam        Assessment:    1. Endometritis    RX: Augmentin x 7 days Pelvic US complete   2. Postpartum exam   3. Gestational diabetes mellitus (GDM) in third trimester.  - attempted PP 2 hour, patient vomited. She will reschedule with Zofran use.    Plan:   Essential components of care per ACOG recommendations:  1.  Mood and well being: Patient with negative depression screening today. Reviewed local resources for support.  - Patient tobacco use? No.   - hx of drug use? Yes. Discussed support systems and outpatient/inpatient treatment options.    2. Infant care and feeding:  -Patient currently breastmilk feeding? No.  -Social determinants of health (SDOH) reviewed in EPIC. No concerns, supportive family at home.   3. Sexuality, contraception and birth spacing - Patient does not want a pregnancy in the next year.  Desired family size is 4 children.  - Reviewed forms of contraception in tiered fashion. Patient desired Depo-Provera today.   - Discussed birth spacing of 18 months  4. Sleep and fatigue -Encouraged family/partner/community support of 4 hrs of uninterrupted sleep to help with mood and fatigue  5. Physical Recovery  - Discussed patients delivery and complications. She describes her labor as good. - Patient had a Vaginal, no problems at delivery. Patient had a None laceration. Perineal healing reviewed. Patient expressed understanding - Patient has urinary incontinence? No. - Patient is not safe to resume physical and sexual activity  6.  Health Maintenance - HM due items addressed Yes - Last pap smear  Diagnosis  Date  Value Ref Range Status  07/06/2019   Final   NEGATIVE FOR INTRAEPITHELIAL LESIONS OR MALIGNANCY.   Pap smear not done at today's visit.  -Breast Cancer screening indicated? No.   7. Chronic Disease/Pregnancy Condition follow up: Gestational Diabetes, will return to attempt completion of 2 hour.   - PCP follow up    [name], NP Center for [facility], [facility] GroupPatient not able to finish her 2 hour gtt due to upset stomach.

## 2021-05-24 NOTE — Progress Notes (Unsigned)
    Post Partum Visit Note  [name] Weber is a 25 y.o. [phone] female who presents for a postpartum visit. She is 1 month postpartum following a vaginal delivery:313099}.  I have fully reviewed the prenatal and intrapartum course. The delivery was at 36.5 gestational weeks.  Anesthesia:natural. Postpartum course has been uncomplicated. Baby is doing well. Baby is feeding by bottle. Bleeding yes. Bowel function is normal. Bladder function is normal. Patient is not sexually active. Contraception method is depo-provera. Postpartum depression screening: negative  The pregnancy intention screening data noted above was reviewed. Potential methods of contraception were discussed. The patient elected to proceed with {Upstream End Methods:24109}.     Health Maintenance Due  Topic Date Due  . Pneumococcal Vaccine 87-34 Years old (1 of 4 - PCV13) Never done  . URINE MICROALBUMIN  Never done  . HPV VACCINES (1 - 2-dose series) Never done  . COVID-19 Vaccine (3 - Booster) 02/03/2021    {Common ambulatory SmartLinks:19316}  Review of Systems {ros; complete:30496}  Objective:  BP 117/86   Pulse 77   Wt 128 lb 12.8 oz (58.4 kg)   LMP 07/28/2020   BMI 22.82 kg/m    General:  {gen appearance:16600}   Breasts:  {desc; normal/abnormal/not indicated:14647}  Lungs: {lung exam:16931}  Heart:  {heart exam:5510}  Abdomen: {abdomen exam:16834}   Wound {Wound assessment:11097}  GU exam:  {desc; normal/abnormal/not indicated:14647}       Assessment:    There are no diagnoses linked to this encounter.  *** postpartum exam.   Plan:   Essential components of care per ACOG recommendations:  1.  Mood and well being: Patient with {gen negative/positive:315881} depression screening today. Reviewed local resources for support.  - Patient tobacco use? {tobacco use:25506}  - hx of drug use? {yes/no:25505}    2. Infant care and feeding:  -Patient currently breastmilk feeding? {yes/no:25502}   -Social determinants of health (SDOH) reviewed in EPIC. No concerns***The following needs were identified***  3. Sexuality, contraception and birth spacing - Patient {DOES_DOES VQX:45038} want a pregnancy in the next year.  Desired family size is {NUMBER 1-10:22536} children.  - Reviewed forms of contraception in tiered fashion. Patient desired {PLAN CONTRACEPTION:313102} today.   - Discussed birth spacing of 18 months  4. Sleep and fatigue -Encouraged family/partner/community support of 4 hrs of uninterrupted sleep to help with mood and fatigue  5. Physical Recovery  - Discussed patients delivery and complications. She describes her labor as {description:25511} - Patient had a {CHL AMB DELIVERY:215 [id number]}. Patient had a {laceration:25518} laceration. Perineal healing reviewed. Patient expressed understanding - Patient has urinary incontinence? {yes/no:25515} - Patient {ACTION; IS/IS UEK:80034917} safe to resume physical and sexual activity  6.  Health Maintenance - HM due items addressed {Yes or If no, why not?:20788} - Last pap smear  Diagnosis  Date Value Ref Range Status  07/06/2019   Final   NEGATIVE FOR INTRAEPITHELIAL LESIONS OR MALIGNANCY.   Pap smear {done:10129} at today's visit.  -Breast Cancer screening indicated? {indicated:25516}  7. Chronic Disease/Pregnancy Condition follow up: {Follow up:25499}  - PCP follow up  Stanislav Gervase Emeline Darling, CMA Center for [employer], [facility]

## 2021-05-26 ENCOUNTER — Emergency Department (HOSPITAL_COMMUNITY)
Admission: EM | Admit: 2021-05-26 | Discharge: 2021-05-26 | Disposition: A | Discharge status: Home / Self Care | Payer: Medicaid Other | Attending: Emergency Medicine | Admitting: Emergency Medicine

## 2021-05-26 ENCOUNTER — Emergency Department (HOSPITAL_COMMUNITY): Payer: Medicaid Other

## 2021-05-26 ENCOUNTER — Other Ambulatory Visit: Payer: Self-pay

## 2021-05-26 DIAGNOSIS — R112 Nausea with vomiting, unspecified: Secondary | ICD-10-CM | POA: Diagnosis present

## 2021-05-26 DIAGNOSIS — Z87891 Personal history of nicotine dependence: Secondary | ICD-10-CM | POA: Diagnosis not present

## 2021-05-26 DIAGNOSIS — Z20822 Contact with and (suspected) exposure to covid-19: Secondary | ICD-10-CM | POA: Insufficient documentation

## 2021-05-26 DIAGNOSIS — E860 Dehydration: Secondary | ICD-10-CM | POA: Insufficient documentation

## 2021-05-26 DIAGNOSIS — K8501 Idiopathic acute pancreatitis with uninfected necrosis: Secondary | ICD-10-CM | POA: Insufficient documentation

## 2021-05-26 LAB — URINALYSIS, ROUTINE W REFLEX MICROSCOPIC
Bacteria, UA: NONE SEEN
Bilirubin Urine: NEGATIVE
Glucose, UA: 50 mg/dL — AB
Ketones, ur: 20 mg/dL — AB
Leukocytes,Ua: NEGATIVE
Nitrite: NEGATIVE
Protein, ur: >=300 mg/dL — AB
Specific Gravity, Urine: 1.032 — ABNORMAL HIGH (ref 1.005–1.030)
pH: 6 (ref 5.0–8.0)

## 2021-05-26 LAB — COMPREHENSIVE METABOLIC PANEL
ALT: 40 U/L (ref 0–44)
AST: 28 U/L (ref 15–41)
Albumin: 5.3 g/dL — ABNORMAL HIGH (ref 3.5–5.0)
Alkaline Phosphatase: 48 U/L (ref 38–126)
Anion gap: 16 — ABNORMAL HIGH (ref 5–15)
BUN: 25 mg/dL — ABNORMAL HIGH (ref 6–20)
CO2: 24 mmol/L (ref 22–32)
Calcium: 10.3 mg/dL (ref 8.9–10.3)
Chloride: 97 mmol/L — ABNORMAL LOW (ref 98–111)
Creatinine, Ser: 1.14 mg/dL — ABNORMAL HIGH (ref 0.44–1.00)
GFR, Estimated: >60 mL/min (ref >60)
Glucose, Bld: 116 mg/dL — ABNORMAL HIGH (ref 70–99)
Potassium: 3.4 mmol/L — ABNORMAL LOW (ref 3.5–5.1)
Sodium: 137 mmol/L (ref 135–145)
Total Bilirubin: 1.8 mg/dL — ABNORMAL HIGH (ref 0.3–1.2)
Total Protein: 8.8 g/dL — ABNORMAL HIGH (ref 6.5–8.1)

## 2021-05-26 LAB — HCG, QUANTITATIVE, PREGNANCY: hCG, Beta Chain, Quant, S: 1 m[IU]/mL (ref <5)

## 2021-05-26 LAB — LIPASE, BLOOD: Lipase: 124 U/L — ABNORMAL HIGH (ref 11–51)

## 2021-05-26 LAB — CBC WITH DIFFERENTIAL/PLATELET
Abs Immature Granulocytes: 0.11 10*3/uL — ABNORMAL HIGH (ref 0.00–0.07)
Basophils Absolute: 0.1 10*3/uL (ref 0.0–0.1)
Basophils Relative: 0 %
Eosinophils Absolute: 0 10*3/uL (ref 0.0–0.5)
Eosinophils Relative: 0 %
HCT: 41.5 % (ref 36.0–46.0)
Hemoglobin: 15 g/dL (ref 12.0–15.0)
Immature Granulocytes: 1 %
Lymphocytes Relative: 11 %
Lymphs Abs: 2.3 10*3/uL (ref 0.7–4.0)
MCH: 27.3 pg (ref 26.0–34.0)
MCHC: 36.1 g/dL — ABNORMAL HIGH (ref 30.0–36.0)
MCV: 75.5 fL — ABNORMAL LOW (ref 80.0–100.0)
Monocytes Absolute: 1.3 10*3/uL — ABNORMAL HIGH (ref 0.1–1.0)
Monocytes Relative: 6 %
Neutro Abs: 18.3 10*3/uL — ABNORMAL HIGH (ref 1.7–7.7)
Neutrophils Relative %: 82 %
Platelets: 333 10*3/uL (ref 150–400)
RBC: 5.5 MIL/uL — ABNORMAL HIGH (ref 3.87–5.11)
RDW: 13.3 % (ref 11.5–15.5)
WBC: 22.1 10*3/uL — ABNORMAL HIGH (ref 4.0–10.5)
nRBC: 0 % (ref 0.0–0.2)

## 2021-05-26 LAB — ETHANOL: Alcohol, Ethyl (B): <10 mg/dL (ref <10)

## 2021-05-26 LAB — MAGNESIUM: Magnesium: 2.3 mg/dL (ref 1.7–2.4)

## 2021-05-26 MED ORDER — SODIUM CHLORIDE 0.9 % IV BOLUS
1000.0000 mL | Freq: Once | INTRAVENOUS | Status: AC
  Administered 2021-05-26: 1000 mL via INTRAVENOUS

## 2021-05-26 MED ORDER — SUCRALFATE 1 G PO TABS: 1.0000 g | ORAL_TABLET | Freq: Three times a day (TID) | ORAL | 0 refills | Status: DC

## 2021-05-26 MED ORDER — FAMOTIDINE IN NACL 20-0.9 MG/50ML-% IV SOLN
20.0000 mg | Freq: Once | INTRAVENOUS | Status: AC
  Administered 2021-05-26: 20 mg via INTRAVENOUS
  Filled 2021-05-26: qty 50

## 2021-05-26 MED ORDER — FAMOTIDINE 20 MG PO TABS: 20.0000 mg | ORAL_TABLET | Freq: Two times a day (BID) | ORAL | 0 refills | Status: DC

## 2021-05-26 MED ORDER — SUCRALFATE 1 GM/10ML PO SUSP
1.0000 g | Freq: Once | ORAL | Status: AC
  Administered 2021-05-26: 1 g via ORAL
  Filled 2021-05-26: qty 10

## 2021-05-26 MED ORDER — FENTANYL CITRATE (PF) 100 MCG/2ML IJ SOLN
50.0000 ug | Freq: Once | INTRAMUSCULAR | Status: AC
Start: 2021-05-26 — End: 2021-05-26
  Administered 2021-05-26: 50 ug via INTRAVENOUS
  Filled 2021-05-26: qty 2

## 2021-05-26 NOTE — ED Triage Notes (Signed)
Pt came in with c/o abdominal pain times three days. Pt had a baby 4/22. She was a gestational diabetic and went to the doctor for a glucose tolerance test three days ago. Since then, pt endorses abdominal pain and n/v for three days. Pain is epigastric

## 2021-05-26 NOTE — ED Provider Notes (Signed)
[facility] DEPT Provider Note   CSN: [id number] Arrival date & time: 05/26/21  0553     History Chief Complaint  Patient presents with  . Abdominal Pain    [name] Weber is a 25 y.o. female.  HPI Patient presents with abdominal pain, nausea, vomiting, weakness. Patient has 4 children, mostly recent delivery was 6 weeks ago.  Her most recent pregnancy complicated with gestational diabetes.  After completing the pregnancy because she continued to have pain, and now complains of pain in the midline abdomen upper greater than lower with associated anorexia, nausea, weakness.  She has been incapable of taking medicine for relief secondary to nausea vomiting.  No fever, no lateral abdominal pain, no diarrhea.  2 days ago she saw her OB team for follow-up.  She seemingly had glucose testing, that resulted in nausea, vomiting which has been persistent since that time, more severe than the prior weeks.    Past Medical History:  Diagnosis Date  . Chlamydia infection   . GDM (gestational diabetes mellitus) 02/2021  . Trichomonas 01/2012  . UTI (lower urinary tract infection)     Patient Active Problem List   Diagnosis Date Noted  . Encounter for induction of labor 04/12/2021  . Vaginal delivery 04/12/2021  . Gestational diabetes 03/07/2021  . IUGR (intrauterine growth restriction) affecting care of mother 03/07/2021  . Alpha thalassemia silent carrier 10/29/2020  . Former tobacco use 10/18/2020  . Encounter for supervision of other normal pregnancy, first trimester 10/10/2020  . Leukocytosis 10/04/2020  . Cannabinoid hyperemesis syndrome 11/22/2018  . Alcohol abuse 11/22/2018  . Intractable nausea and vomiting 11/21/2018  . Hypokalemia 11/21/2018  . Sickle cell trait ([place]) 07/03/2014    Past Surgical History:  Procedure Laterality Date  . DRUG INDUCED ENDOSCOPY    . NO PAST SURGERIES       OB History    Gravida  5   Para  4   Term  3    Preterm  1   AB  1   Living  4     SAB  1   IAB  0   Ectopic      Multiple  0   Live Births  4           Family History  Problem Relation Age of Onset  . Diabetes Maternal Aunt   . Healthy Mother   . Healthy Father   . Alcohol abuse Neg Hx   . Arthritis Neg Hx   . Asthma Neg Hx   . Birth defects Neg Hx   . Cancer Neg Hx   . COPD Neg Hx   . Depression Neg Hx   . Drug abuse Neg Hx   . Early death Neg Hx   . Hearing loss Neg Hx   . Heart disease Neg Hx   . Hyperlipidemia Neg Hx   . Hypertension Neg Hx   . Kidney disease Neg Hx   . Learning disabilities Neg Hx   . Mental illness Neg Hx   . Mental retardation Neg Hx   . Miscarriages / Stillbirths Neg Hx   . Stroke Neg Hx   . Vision loss Neg Hx     Social History   Tobacco Use  . Smoking status: Former Smoker    Packs/day: 0.50    Types: Cigarettes    Quit date: 10/22/2020    Years since quitting: 0.5  . Smokeless tobacco: Never Used  Vaping Use  .  Vaping Use: Never used  Substance Use Topics  . Alcohol use: Not Currently  . Drug use: Yes    Types: Marijuana    Comment: Last used early October    Home Medications Prior to Admission medications   Medication Sig Start Date End Date Taking? Authorizing Provider  Prenatal Vit-Fe Fumarate-FA (PRENATAL MULTIVITAMIN) TABS tablet Take 1 tablet by mouth daily.   Yes [provider]  acetaminophen (TYLENOL) 500 MG tablet Take 2 tablets (1,000 mg total) by mouth every 6 (six) hours as needed (for pain scale < 4). Patient not taking: No sig reported 0/24/09   Arrie Senate, MD  amoxicillin-clavulanate (AUGMENTIN) 875-125 MG tablet Take 1 tablet by mouth 2 (two) times daily. Patient not taking: Reported on 05/26/2021 05/24/21   Rasch, Anderson Malta I, NP  Blood Pressure Monitoring (BLOOD PRESSURE KIT) DEVI 1 kit by Does not apply route once a week. Patient not taking: No sig reported 10/10/20   Griffin Basil, MD  coconut oil OIL Apply 1 application  topically as needed. Patient not taking: No sig reported 7/35/32   Arrie Senate, MD  docusate calcium (SURFAK) 240 MG capsule Take 1 capsule (240 mg total) by mouth 2 (two) times daily. Patient not taking: No sig reported 11/14/20   Virginia Rochester, NP  FLUoxetine (PROZAC) 20 MG capsule Take 40 mg by mouth daily.  Patient not taking: No sig reported 09/13/19   [provider]  ibuprofen (ADVIL) 600 MG tablet Take 1 tablet (600 mg total) by mouth every 6 (six) hours as needed. Patient not taking: No sig reported 9/92/42   Arrie Senate, MD  ondansetron (ZOFRAN ODT) 8 MG disintegrating tablet Take 1 tablet (8 mg total) by mouth every 8 (eight) hours as needed for nausea or vomiting. Patient not taking: Reported on 05/26/2021 05/24/21   Rasch, Anderson Malta I, NP  QUEtiapine (SEROQUEL) 50 MG tablet Take 50 mg by mouth at bedtime. Patient not taking: No sig reported 09/13/19   [provider]    Allergies    Patient has no known allergies.  Review of Systems   Review of Systems  Constitutional:       Per HPI, otherwise negative  HENT:       Per HPI, otherwise negative  Respiratory:       Per HPI, otherwise negative  Cardiovascular:       Per HPI, otherwise negative  Gastrointestinal: Positive for abdominal pain, nausea and vomiting.  Endocrine:       Negative aside from HPI  Genitourinary:       Neg aside from HPI   Musculoskeletal:       Per HPI, otherwise negative  Skin: Negative.   Neurological: Negative for syncope.    Physical Exam Updated Vital Signs BP 105/77   Pulse 83   Temp 99.1 F (37.3 C) (Oral)   Resp 16   Ht _0  (1.6 m)   Wt 58.1 kg   LMP 07/28/2020   SpO2 99%   BMI 22.67 kg/m   Physical Exam Vitals and nursing note reviewed.  Constitutional:      General: She is not in acute distress.    Appearance: She is well-developed.  HENT:     Head: Normocephalic and atraumatic.  Eyes:     Conjunctiva/sclera: Conjunctivae normal.   Cardiovascular:     Rate and Rhythm: Normal rate and regular rhythm.  Pulmonary:     Effort: Pulmonary effort is normal. No respiratory distress.  Breath sounds: Normal breath sounds. No stridor.  Abdominal:     General: There is no distension.     Tenderness: There is abdominal tenderness in the epigastric area.  Skin:    General: Skin is warm and dry.  Neurological:     Mental Status: She is alert and oriented to person, place, and time.     Cranial Nerves: No cranial nerve deficit.     ED Results / Procedures / Treatments   Labs (all labs ordered are listed, but only abnormal results are displayed) Labs Reviewed  COMPREHENSIVE METABOLIC PANEL - Abnormal; Notable for the following components:      Result Value   Potassium 3.4 (*)    Chloride 97 (*)    Glucose, Bld 116 (*)    BUN 25 (*)    Creatinine, Ser 1.14 (*)    Total Protein 8.8 (*)    Albumin 5.3 (*)    Total Bilirubin 1.8 (*)    Anion gap 16 (*)    All other components within normal limits  LIPASE, BLOOD - Abnormal; Notable for the following components:   Lipase 124 (*)    All other components within normal limits  CBC WITH DIFFERENTIAL/PLATELET - Abnormal; Notable for the following components:   WBC 22.1 (*)    RBC 5.50 (*)    MCV 75.5 (*)    MCHC 36.1 (*)    Neutro Abs 18.3 (*)    Monocytes Absolute 1.3 (*)    Abs Immature Granulocytes 0.11 (*)    All other components within normal limits  URINALYSIS, ROUTINE W REFLEX MICROSCOPIC - Abnormal; Notable for the following components:   Color, Urine AMBER (*)    APPearance CLOUDY (*)    Specific Gravity, Urine 1.032 (*)    Glucose, UA 50 (*)    Hgb urine dipstick MODERATE (*)    Ketones, ur 20 (*)    Protein, ur >=300 (*)    Non Squamous Epithelial 0-5 (*)    All other components within normal limits  SARS CORONAVIRUS 2 (TAT 6-24 HRS)  ETHANOL  MAGNESIUM  HCG, QUANTITATIVE, PREGNANCY    EKG None  Radiology No results  found.  Procedures Procedures   Medications Ordered in ED Medications  sodium chloride 0.9 % bolus 1,000 mL (0 mLs Intravenous Stopped 05/26/21 0943)  famotidine (PEPCID) IVPB 20 mg premix (0 mg Intravenous Stopped 05/26/21 0943)  sucralfate (CARAFATE) 1 GM/10ML suspension 1 g (1 g Oral Given 05/26/21 0818)  sodium chloride 0.9 % bolus 1,000 mL (0 mLs Intravenous Stopped 05/26/21 1239)  fentaNYL (SUBLIMAZE) injection 50 mcg (50 mcg Intravenous Given 05/26/21 1019)  sodium chloride 0.9 % bolus 1,000 mL (0 mLs Intravenous Stopped 05/26/21 1146)  sodium chloride 0.9 % bolus 1,000 mL (1,000 mLs Intravenous New Bag/Given 05/26/21 1247)    ED Course  I have reviewed the triage vital signs and the nursing notes.  Pertinent labs & imaging results that were available during my care of the patient were reviewed by me and considered in my medical decision making (see chart for details).    10:38 AM Patient marginally better, initial labs notable for elevated lipase, near criteria AKI.  Patient improving, we discussed findings thus far again, she states that she wants to go home.   2:49 PM Ultrasound reviewed, discussed with patient continue to improve.  We discussed all findings again, ultrasound does not demonstrate acute hepatobiliary dysfunction, abscess, stones. Patient has improved substantially, with 4 L via fluid resuscitation, Pepcid,  Zofran, fentanyl.  She again states that she would like to go home to take care of her children including her newborn. We discussed clear liquid diet, slow advance to regular feeds, return precautions, patient discharged in stable condition.  MDM Rules/Calculators/A&P MDM Number of Diagnoses or Management Options Dehydration: established, worsening Idiopathic acute pancreatitis with uninfected necrosis: new, needed workup   Amount and/or Complexity of Data Reviewed Clinical lab tests: ordered and reviewed Tests in the radiology section of CPT: ordered and  reviewed Tests in the medicine section of CPT: reviewed and ordered Decide to obtain previous medical records or to obtain history from someone other than the patient: yes Review and summarize past medical records: yes Independent visualization of images, tracings, or specimens: yes  Risk of Complications, Morbidity, and/or Mortality Presenting problems: high Diagnostic procedures: high Management options: high  Critical Care Total time providing critical care: < 30 minutes  Patient Progress Patient progress: improved  Final Clinical Impression(s) / ED Diagnoses Final diagnoses:  Idiopathic acute pancreatitis with uninfected necrosis  Dehydration    Rx / DC Orders ED Discharge Orders         Ordered    famotidine (PEPCID) 20 MG tablet  2 times daily        05/26/21 1453    sucralfate (CARAFATE) 1 g tablet  3 times daily with meals & bedtime       Note to Pharmacy: Take for one week   05/26/21 1453           Carmin Muskrat, MD 05/26/21 1454

## 2021-05-26 NOTE — Discharge Instructions (Signed)
As discussed, it is important that you follow-up with your physician and our gastroenterologist as needed.  Focus on staying well-hydrated, and do not hesitate to return here for concerning changes in your condition.

## 2021-05-27 LAB — SARS CORONAVIRUS 2 (TAT 6-24 HRS): SARS Coronavirus 2: NEGATIVE

## 2021-05-30 ENCOUNTER — Ambulatory Visit (HOSPITAL_BASED_OUTPATIENT_CLINIC_OR_DEPARTMENT_OTHER): Payer: Medicaid Other

## 2021-06-03 ENCOUNTER — Other Ambulatory Visit: Payer: Medicaid Other

## 2021-06-04 ENCOUNTER — Other Ambulatory Visit: Payer: Self-pay

## 2021-06-04 ENCOUNTER — Other Ambulatory Visit (INDEPENDENT_AMBULATORY_CARE_PROVIDER_SITE_OTHER): Payer: Medicaid Other

## 2021-06-04 ENCOUNTER — Ambulatory Visit (INDEPENDENT_AMBULATORY_CARE_PROVIDER_SITE_OTHER): Payer: Medicaid Other | Admitting: Gastroenterology

## 2021-06-04 ENCOUNTER — Encounter: Payer: Self-pay | Admitting: Gastroenterology

## 2021-06-04 VITALS — BP 119/70 | HR 94 | Ht 63.0 in | Wt 135.0 lb

## 2021-06-04 DIAGNOSIS — R748 Abnormal levels of other serum enzymes: Secondary | ICD-10-CM

## 2021-06-04 DIAGNOSIS — R112 Nausea with vomiting, unspecified: Secondary | ICD-10-CM

## 2021-06-04 DIAGNOSIS — R1013 Epigastric pain: Secondary | ICD-10-CM

## 2021-06-04 DIAGNOSIS — D72829 Elevated white blood cell count, unspecified: Secondary | ICD-10-CM

## 2021-06-04 LAB — CBC WITH DIFFERENTIAL/PLATELET
Basophils Absolute: 0 10*3/uL (ref 0.0–0.1)
Basophils Relative: 0.5 % (ref 0.0–3.0)
Eosinophils Absolute: 0.3 10*3/uL (ref 0.0–0.7)
Eosinophils Relative: 3.1 % (ref 0.0–5.0)
HCT: 34.3 % — ABNORMAL LOW (ref 36.0–46.0)
Hemoglobin: 11.9 g/dL — ABNORMAL LOW (ref 12.0–15.0)
Lymphocytes Relative: 34.2 % (ref 12.0–46.0)
Lymphs Abs: 3.1 10*3/uL (ref 0.7–4.0)
MCHC: 34.6 g/dL (ref 30.0–36.0)
MCV: 79.9 fl (ref 78.0–100.0)
Monocytes Absolute: 0.6 10*3/uL (ref 0.1–1.0)
Monocytes Relative: 7.1 % (ref 3.0–12.0)
Neutro Abs: 5 10*3/uL (ref 1.4–7.7)
Neutrophils Relative %: 55.1 % (ref 43.0–77.0)
Platelets: 220 10*3/uL (ref 150.0–400.0)
RBC: 4.29 Mil/uL (ref 3.87–5.11)
RDW: 14.4 % (ref 11.5–15.5)
WBC: 9 10*3/uL (ref 4.0–10.5)

## 2021-06-04 LAB — COMPREHENSIVE METABOLIC PANEL
ALT: 26 U/L (ref 0–35)
AST: 18 U/L (ref 0–37)
Albumin: 4.3 g/dL (ref 3.5–5.2)
Alkaline Phosphatase: 45 U/L (ref 39–117)
BUN: 11 mg/dL (ref 6–23)
CO2: 26 mEq/L (ref 19–32)
Calcium: 8.7 mg/dL (ref 8.4–10.5)
Chloride: 108 mEq/L (ref 96–112)
Creatinine, Ser: 0.92 mg/dL (ref 0.40–1.20)
GFR: 87.14 mL/min (ref >60.00)
Glucose, Bld: 86 mg/dL (ref 70–99)
Potassium: 3.9 mEq/L (ref 3.5–5.1)
Sodium: 139 mEq/L (ref 135–145)
Total Bilirubin: 0.3 mg/dL (ref 0.2–1.2)
Total Protein: 6.6 g/dL (ref 6.0–8.3)

## 2021-06-04 LAB — LIPASE: Lipase: 71 U/L — ABNORMAL HIGH (ref 11.0–59.0)

## 2021-06-04 NOTE — Patient Instructions (Signed)
We have sent the following medications to your pharmacy for you to pick up at your convenience: Pantoprazole 40 mg daily 30-60 minutes before breakfast. Carafate 1 gm tablet before meals and at bedtime.   Continue Pepcid at bedtime  Your provider has requested that you go to the basement level for lab work before leaving today. Press "B" on the elevator. The lab is located at the first door on the left as you exit the elevator.  You have been scheduled for a CT scan of the abdomen and pelvis at [facility], 1st floor Radiology. You are scheduled on Wednesday 06/12/21  at 9 am. You should arrive 15 minutes prior to your appointment time for registration. The solution may taste better if refrigerated, but do NOT add ice or any other liquid to this solution. Shake well before drinking.   Please follow the written instructions below on the day of your exam:   1) Do not eat anything after 5 am (4 hours prior to your test)   2) Drink 1 bottle of contrast @ 7 am (2 hours prior to your exam)  Remember to shake well before drinking and do NOT pour over ice.     Drink 1 bottle of contrast @ 8 am (1 hour prior to your exam)   You may take any medications as prescribed with a small amount of water, if necessary. If you take any of the following medications: METFORMIN, GLUCOPHAGE, GLUCOVANCE, AVANDAMET, RIOMET, FORTAMET, [place] MET, JANUMET, GLUMETZA or METAGLIP, you MAY be asked to HOLD this medication 48 hours AFTER the exam.   The purpose of you drinking the oral contrast is to aid in the visualization of your intestinal tract. The contrast solution may cause some diarrhea. Depending on your individual set of symptoms, you may also receive an intravenous injection of x-ray contrast/dye. Plan on being at [facility] for 45 minutes or longer, depending on the type of exam you are having performed.   If you have any questions regarding your exam or if you need to reschedule, you may call [facility] at [phone] between the hours of 8:00 am and 5:00 pm, Monday-Friday.

## 2021-06-04 NOTE — Progress Notes (Signed)
06/19/2021 LENNAN MALONE [id number] Mar 16, 1996   HISTORY OF PRESENT ILLNESS: This is a 25 year old female who is a patient of Dr. Tarri Glenn.  She is here today for follow-up after recent ER visit.  She went to the ER on 6/5 for complaints of epigastric abdominal pain with nausea and vomiting.  Was found to have an elevated [place] blood cell count at 22K and an elevated lipase at 124.  Ultrasound was unremarkable.  They diagnosed her with pancreatitis of unknown source and placed her on Pepcid twice daily and Carafate tablet 4 times a day for 1 week.  She has completed the Carafate and continues on the Pepcid twice daily.  She says she feels a little bit better, but is still having a lot of epigastric abdominal pain and vomiting at times.  She complains of a lot of bloating and gas.  Of note, she was started on Augmentin on 6/3 at her 6-week postpartum visit for diagnosis of endometritis.  It appears that this was prescribed twice a day for 7 days and she has completed it.  EGD 03/2020 with Dr. Tarri Glenn she had a small hiatal hernia.  1. Surgical [P], duodenum - DUODENAL MUCOSA WITH NO SIGNIFICANT PATHOLOGIC FINDINGS. - NEGATIVE FOR INCREASED INTRAEPITHELIAL LYMPHOCYTES AND VILLOUS ARCHITECTURAL CHANGES. 2. Surgical [P], gastric antrum and gastric body - GASTRIC ANTRAL MUCOSA WITH MILD REACTIVE GASTROPATHY. - GASTRIC OXYNTIC MUCOSA WITH MILD CHRONIC GASTRITIS. - WARTHIN-STARRY STAIN IS NEGATIVE FOR HELICOBACTER PYLORI. 3. Surgical [P], distal esophagus - SQUAMOCOLUMNAR ESOPHAGEAL MUCOSA WITH REACTIVE/REGENERATIVE CHANGES. - NEGATIVE FOR INTESTINAL METAPLASIA (GOBLET CELL METAPLASIA). - NEGATIVE FOR INCREASED INTRAEPITHELIAL EOSINOPHILS. 4. Surgical [P], mid/proximal esophagus - SQUAMOUS ESOPHAGEAL EPITHELIUM WITH NO SIGNIFICANT PATHOLOGIC FINDINGS. - NEGATIVE FOR INCREASED INTRAEPITHELIAL EOSINOPHILS.   Past Medical History:  Diagnosis Date   Chlamydia infection    GDM (gestational  diabetes mellitus) 02/2021   Trichomonas 01/2012   UTI (lower urinary tract infection)    Past Surgical History:  Procedure Laterality Date   DRUG INDUCED ENDOSCOPY     NO PAST SURGERIES      reports that she quit smoking about 7 months ago. Her smoking use included cigarettes. She smoked an average of 0.50 packs per day. She has never used smokeless tobacco. She reports previous alcohol use. She reports current drug use. Drug: Marijuana. family history includes Diabetes in her maternal aunt; Healthy in her father and mother. No Known Allergies    Outpatient Encounter Medications as of 06/04/2021  Medication Sig   amoxicillin-clavulanate (AUGMENTIN) 875-125 MG tablet Take 1 tablet by mouth 2 (two) times daily.   famotidine (PEPCID) 20 MG tablet Take 20 mg by mouth 2 (two) times daily.   ondansetron (ZOFRAN ODT) 8 MG disintegrating tablet Take 1 tablet (8 mg total) by mouth every 8 (eight) hours as needed for nausea or vomiting.   Prenatal Vit-Fe Fumarate-FA (PRENATAL MULTIVITAMIN) TABS tablet Take 1 tablet by mouth daily.   sucralfate (CARAFATE) 1 g tablet Take 1 tablet (1 g total) by mouth 4 (four) times daily -  with meals and at bedtime.   [DISCONTINUED] acetaminophen (TYLENOL) 500 MG tablet Take 2 tablets (1,000 mg total) by mouth every 6 (six) hours as needed (for pain scale < 4). (Patient not taking: No sig reported)   [DISCONTINUED] Blood Pressure Monitoring (BLOOD PRESSURE KIT) DEVI 1 kit by Does not apply route once a week. (Patient not taking: No sig reported)   [DISCONTINUED] coconut oil OIL Apply 1 application topically as  needed. (Patient not taking: No sig reported)   [DISCONTINUED] docusate calcium (SURFAK) 240 MG capsule Take 1 capsule (240 mg total) by mouth 2 (two) times daily. (Patient not taking: No sig reported)   [DISCONTINUED] famotidine (PEPCID) 20 MG tablet Take 1 tablet (20 mg total) by mouth 2 (two) times daily. (Patient not taking: Reported on 06/04/2021)    [DISCONTINUED] FLUoxetine (PROZAC) 20 MG capsule Take 40 mg by mouth daily.  (Patient not taking: No sig reported)   [DISCONTINUED] ibuprofen (ADVIL) 600 MG tablet Take 1 tablet (600 mg total) by mouth every 6 (six) hours as needed. (Patient not taking: No sig reported)   [DISCONTINUED] QUEtiapine (SEROQUEL) 50 MG tablet Take 50 mg by mouth at bedtime. (Patient not taking: No sig reported)   No facility-administered encounter medications on file as of 06/04/2021.    REVIEW OF SYSTEMS  : All other systems reviewed and negative except where noted in the History of Present Illness.   PHYSICAL EXAM: BP 119/70   Pulse 94   Ht _0  (1.6 m)   Wt 135 lb (61.2 kg)   LMP 07/28/2020   SpO2 97%   BMI 23.91 kg/m  General: Well developed AA female in no acute distress Head: Normocephalic and atraumatic Eyes:  Sclerae anicteric, conjunctiva pink. Ears: Normal auditory acuity Lungs: Clear throughout to auscultation; no W/R/R. Heart: Regular rate and rhythm; no M/R/G. Abdomen: Soft, non-distended.  BS present.  Mild epigastric TTP. Musculoskeletal: Symmetrical with no gross deformities  Skin: No lesions on visible extremities Extremities: No edema  Neurological: Alert oriented x 4, grossly non-focal Psychological:  Alert and cooperative. Normal mood and affect  ASSESSMENT AND PLAN: *25 year old female with complaints of epigastric abdominal pain with nausea and vomiting.  Recent ER visit showed an elevated lipase up to 124 and a leukocytosis of 22K.  Still having symptoms, although slightly better.  Is on Pepcid 20 mg twice daily and had taken Carafate tablets 4 times a day for a week.  On repeat labs today including a CBC, CMP, and lipase.  We will plan for CT scan of the abdomen and pelvis.  I am going to place her on pantoprazole 40 mg once daily in the morning and have her continue Pepcid at bedtime.  I am also going to place her back on Carafate tablet 4 times a day.  Prescriptions sent to  pharmacy.  Of note, she was on Augmentin starting 6/3 for 7 days  This can obviously cause abdominal pain and nausea/vomiting, but does not have a side effect of pancreatitis and she completed that medicine few days ago now.  CC:  Duard Larsen, MD

## 2021-06-05 ENCOUNTER — Ambulatory Visit: Payer: Medicaid Other | Admitting: Nurse Practitioner

## 2021-06-07 ENCOUNTER — Other Ambulatory Visit: Payer: Self-pay

## 2021-06-07 ENCOUNTER — Ambulatory Visit (HOSPITAL_BASED_OUTPATIENT_CLINIC_OR_DEPARTMENT_OTHER)
Admission: RE | Admit: 2021-06-07 | Discharge: 2021-06-07 | Disposition: A | Discharge status: Home / Self Care | Payer: Medicaid Other | Source: Ambulatory Visit | Attending: Obstetrics and Gynecology | Admitting: Obstetrics and Gynecology

## 2021-06-07 DIAGNOSIS — N719 Inflammatory disease of uterus, unspecified: Secondary | ICD-10-CM | POA: Insufficient documentation

## 2021-06-12 ENCOUNTER — Encounter (HOSPITAL_COMMUNITY): Payer: Self-pay

## 2021-06-12 ENCOUNTER — Ambulatory Visit (HOSPITAL_COMMUNITY): Payer: Medicaid Other

## 2021-06-19 ENCOUNTER — Encounter: Payer: Self-pay | Admitting: Gastroenterology

## 2021-06-19 DIAGNOSIS — R748 Abnormal levels of other serum enzymes: Secondary | ICD-10-CM | POA: Insufficient documentation

## 2021-06-19 DIAGNOSIS — R1013 Epigastric pain: Secondary | ICD-10-CM | POA: Insufficient documentation

## 2021-06-19 NOTE — Progress Notes (Signed)
Reviewed and agree with management plans. I agree with plans for cross-sectional abdominal imaging.   Teagan Ozawa L. Orvan Falconer, MD, MPH

## 2021-07-01 ENCOUNTER — Other Ambulatory Visit: Payer: Self-pay

## 2021-07-01 MED ORDER — MEDROXYPROGESTERONE ACETATE 150 MG/ML IM SUSP: 150.0000 mg | INTRAMUSCULAR | 0 refills | Status: DC

## 2021-07-08 ENCOUNTER — Ambulatory Visit (INDEPENDENT_AMBULATORY_CARE_PROVIDER_SITE_OTHER): Payer: Medicaid Other

## 2021-07-08 ENCOUNTER — Other Ambulatory Visit: Payer: Self-pay

## 2021-07-08 VITALS — BP 100/66 | HR 82 | Ht 63.0 in | Wt 137.0 lb

## 2021-07-08 DIAGNOSIS — Z3042 Encounter for surveillance of injectable contraceptive: Secondary | ICD-10-CM | POA: Diagnosis not present

## 2021-07-08 MED ORDER — MEDROXYPROGESTERONE ACETATE 150 MG/ML IM SUSP
150.0000 mg | Freq: Once | INTRAMUSCULAR | Status: AC
  Administered 2021-07-08: 150 mg via INTRAMUSCULAR

## 2021-07-08 NOTE — Progress Notes (Addendum)
SUBJECTIVE: [name] Weber is a 25 y.o. female who presents for DEPO Injection.  OBJECTIVE: Appears well, in no apparent distress.  Vital signs are normal.   ASSESSMENT: within window for DEPO BC Last PAP 07/06/2019  PLAN:  Needs Annual Exam   DEPO given in LD, tolerated well. Next DEPO due October 3-17, 2022.  Administrations This Visit     medroxyPROGESTERone (DEPO-PROVERA) injection 150 mg     Admin Date 07/08/2021 Action Given Dose 150 mg Route Intramuscular Administered By Maretta Bees, RMA

## 2021-07-09 NOTE — Progress Notes (Signed)
Patient was assessed and managed by nursing staff during this encounter. I have reviewed the chart and agree with the documentation and plan. I have also made any necessary editorial changes.  Meliah Appleman, MD 07/09/2021 9:48 AM   

## 2021-08-12 ENCOUNTER — Encounter: Payer: Self-pay | Admitting: Obstetrics and Gynecology

## 2021-08-12 ENCOUNTER — Ambulatory Visit (INDEPENDENT_AMBULATORY_CARE_PROVIDER_SITE_OTHER): Payer: Medicaid Other | Admitting: Obstetrics and Gynecology

## 2021-08-12 ENCOUNTER — Other Ambulatory Visit: Payer: Self-pay

## 2021-08-12 ENCOUNTER — Other Ambulatory Visit (HOSPITAL_COMMUNITY)
Admission: RE | Admit: 2021-08-12 | Discharge: 2021-08-12 | Disposition: A | Discharge status: Home / Self Care | Payer: Medicaid Other | Source: Ambulatory Visit | Attending: Obstetrics and Gynecology | Admitting: Obstetrics and Gynecology

## 2021-08-12 VITALS — BP 119/77 | HR 81 | Ht 63.0 in | Wt 141.0 lb

## 2021-08-12 DIAGNOSIS — Z01419 Encounter for gynecological examination (general) (routine) without abnormal findings: Secondary | ICD-10-CM | POA: Insufficient documentation

## 2021-08-12 DIAGNOSIS — Z113 Encounter for screening for infections with a predominantly sexual mode of transmission: Secondary | ICD-10-CM | POA: Insufficient documentation

## 2021-08-12 DIAGNOSIS — N764 Abscess of vulva: Secondary | ICD-10-CM | POA: Diagnosis not present

## 2021-08-12 MED ORDER — TRIPLE ANTIBIOTIC 5-400-5000 EX OINT: TOPICAL_OINTMENT | Freq: Four times a day (QID) | CUTANEOUS | 0 refills | Status: DC

## 2021-08-12 MED ORDER — MEDROXYPROGESTERONE ACETATE 150 MG/ML IM SUSP: 150.0000 mg | INTRAMUSCULAR | 4 refills | Status: DC

## 2021-08-12 NOTE — Progress Notes (Signed)
Pt is in office for AEX. Pt is doing well and is on Depo for birth control. Pt states she is on cycle today, declines any testing today.   Pt states she does have a boil like area she would like checked.

## 2021-08-12 NOTE — Progress Notes (Signed)
GYNECOLOGY ANNUAL PREVENTATIVE CARE ENCOUNTER NOTE  History:     [name] Weber is a 25 y.o. [phone] female here for a routine annual gynecologic exam.  Current complaints: small boil on the labia majora on the left - painful - she gets them intermittently. She has one that is resolving on the mons.   Denies abnormal vaginal bleeding, discharge, pelvic pain, problems with intercourse or other gynecologic concerns.      Gynecologic History Patient's last menstrual period was 07/30/2021. Contraception: Depo-Provera injections Last Pap: 06/2019. Result was normal    Social History:  reports that she has been smoking cigarettes. She has been smoking an average of .5 packs per day. She has never used smokeless tobacco. She reports that she does not currently use alcohol. She reports that she does not currently use drugs after having used the following drugs: Marijuana.   The following portions of the patient's history were reviewed and updated as appropriate: allergies, current medications, past family history, past medical history, past social history, past surgical history and problem list.  Review of Systems Pertinent items noted in HPI and remainder of comprehensive ROS otherwise negative.  Physical Exam:  BP 119/77   Pulse 81   Ht 5\' 3"  (1.6 m)   Wt 141 lb (64 kg)   LMP 07/30/2021   BMI 24.98 kg/m  CONSTITUTIONAL: Well-developed, well-nourished female in no acute distress.  HENT:  Normocephalic, atraumatic, External right and left ear normal.  EYES: Conjunctivae and EOM are normal. Pupils are equal, round, and reactive to light. No scleral icterus.  NECK: Normal range of motion, supple, no masses.  Normal thyroid.  SKIN: Skin is warm and dry. No rash noted. Not diaphoretic. No erythema. No pallor. MUSCULOSKELETAL: Normal range of motion. No tenderness.  No cyanosis, clubbing, or edema. NEUROLOGIC: Alert and oriented to person, place, and time. Normal reflexes, muscle tone  coordination.  PSYCHIATRIC: Normal mood and affect. Normal behavior. Normal judgment and thought content.  CARDIOVASCULAR: Normal heart rate noted, regular rhythm RESPIRATORY: Clear to auscultation bilaterally. Effort and breath sounds normal, no problems with respiration noted.  BREASTS: Symmetric in size. No masses, tenderness, skin changes, nipple drainage, or lymphadenopathy bilaterally. Performed in the presence of a chaperone. ABDOMEN: Soft, no distention noted.  No tenderness, rebound or guarding.  PELVIC: Vagina normal without discharge, Urethra without abnormality or discharge, no bladder tenderness, cervix normal in appearance, no CMT, uterus normal size, shape, and consistency, no adnexal masses or tenderness, Small 1 cm vulvar abscess that is superficial and tender, small amount of fluid within it. Performed in the presence of a chaperone.    Assessment and Plan:    1. Encounter for annual routine gynecological examination - Cervical cancer screening: Discussed screening Q3 years. Reviewed importance of annual exams and limits of pap smear. Pap with reflex HPV done  06/2019 - GC/CT: Discussed and recommended. Pt  accepts - Birth Control: Discussed options and their risks, benefits and common side effects; discussed VTE with estrogen containing options. Desires: Depo - she would like to continue this at this time, but is considering another baby in about a year. Discussed impact of depo on fertility and medication switching as she approaches within 6 months of TOC.  - Breast Health: Encouraged self breast awareness/exams. Teaching provided. - Follow-up: 12 months and prn  - Cervicovaginal ancillary only - medroxyPROGESTERone (DEPO-PROVERA) 150 MG/ML injection; Inject 1 mL (150 mg total) into the muscle every 3 (three) months.  Dispense: 1 mL;  Refill: 4  2. Vulvar abscess - Offered drainage today. She would like to try topical therapy first and if refractory, she will come in for  I&D - neomycin-bacitracin-polymyxin (NEOSPORIN) [phone] ointment; Apply topically 4 (four) times daily.  Dispense: 28.3 g; Refill: 0  3. Routine screening for STI (sexually transmitted infection) - No complaints, desires screening - Cervicovaginal ancillary only   Routine preventative health maintenance measures emphasized. Please refer to After Visit Summary for other counseling recommendations.      Milas Hock, MD, FACOG Obstetrician & Gynecologist, [facility], [facility]

## 2021-08-13 LAB — CERVICOVAGINAL ANCILLARY ONLY
Chlamydia: NEGATIVE
Comment: NEGATIVE
Comment: NEGATIVE
Comment: NORMAL
Neisseria Gonorrhea: NEGATIVE
Trichomonas: NEGATIVE

## 2021-09-27 ENCOUNTER — Ambulatory Visit: Payer: Medicaid Other

## 2021-10-07 ENCOUNTER — Emergency Department (HOSPITAL_COMMUNITY)
Admission: EM | Admit: 2021-10-07 | Discharge: 2021-10-07 | Disposition: A | Discharge status: Home / Self Care | Payer: Medicaid Other | Attending: Emergency Medicine | Admitting: Emergency Medicine

## 2021-10-07 ENCOUNTER — Other Ambulatory Visit: Payer: Self-pay

## 2021-10-07 ENCOUNTER — Emergency Department (HOSPITAL_COMMUNITY): Payer: Medicaid Other

## 2021-10-07 ENCOUNTER — Encounter (HOSPITAL_COMMUNITY): Payer: Self-pay

## 2021-10-07 DIAGNOSIS — N9489 Other specified conditions associated with female genital organs and menstrual cycle: Secondary | ICD-10-CM | POA: Diagnosis not present

## 2021-10-07 DIAGNOSIS — R1013 Epigastric pain: Secondary | ICD-10-CM | POA: Insufficient documentation

## 2021-10-07 DIAGNOSIS — R109 Unspecified abdominal pain: Secondary | ICD-10-CM | POA: Diagnosis present

## 2021-10-07 DIAGNOSIS — R1110 Vomiting, unspecified: Secondary | ICD-10-CM | POA: Diagnosis present

## 2021-10-07 DIAGNOSIS — F17210 Nicotine dependence, cigarettes, uncomplicated: Secondary | ICD-10-CM | POA: Diagnosis not present

## 2021-10-07 DIAGNOSIS — E860 Dehydration: Secondary | ICD-10-CM | POA: Diagnosis not present

## 2021-10-07 DIAGNOSIS — Z5321 Procedure and treatment not carried out due to patient leaving prior to being seen by health care provider: Secondary | ICD-10-CM | POA: Insufficient documentation

## 2021-10-07 LAB — URINALYSIS, ROUTINE W REFLEX MICROSCOPIC
Bilirubin Urine: NEGATIVE
Glucose, UA: NEGATIVE mg/dL
Hgb urine dipstick: NEGATIVE
Ketones, ur: 20 mg/dL — AB
Leukocytes,Ua: NEGATIVE
Nitrite: NEGATIVE
Protein, ur: >=300 mg/dL — AB
Specific Gravity, Urine: 1.01 (ref 1.005–1.030)
pH: 6 (ref 5.0–8.0)

## 2021-10-07 LAB — COMPREHENSIVE METABOLIC PANEL
ALT: 26 U/L (ref 0–44)
AST: 24 U/L (ref 15–41)
Albumin: 5.5 g/dL — ABNORMAL HIGH (ref 3.5–5.0)
Alkaline Phosphatase: 47 U/L (ref 38–126)
Anion gap: 13 (ref 5–15)
BUN: 11 mg/dL (ref 6–20)
CO2: 23 mmol/L (ref 22–32)
Calcium: 9.9 mg/dL (ref 8.9–10.3)
Chloride: 104 mmol/L (ref 98–111)
Creatinine, Ser: 0.83 mg/dL (ref 0.44–1.00)
GFR, Estimated: >60 mL/min (ref >60)
Glucose, Bld: 147 mg/dL — ABNORMAL HIGH (ref 70–99)
Potassium: 4 mmol/L (ref 3.5–5.1)
Sodium: 140 mmol/L (ref 135–145)
Total Bilirubin: 1.1 mg/dL (ref 0.3–1.2)
Total Protein: 9.2 g/dL — ABNORMAL HIGH (ref 6.5–8.1)

## 2021-10-07 LAB — CBC
HCT: 40.8 % (ref 36.0–46.0)
Hemoglobin: 14.5 g/dL (ref 12.0–15.0)
MCH: 27.2 pg (ref 26.0–34.0)
MCHC: 35.5 g/dL (ref 30.0–36.0)
MCV: 76.4 fL — ABNORMAL LOW (ref 80.0–100.0)
Platelets: 266 10*3/uL (ref 150–400)
RBC: 5.34 MIL/uL — ABNORMAL HIGH (ref 3.87–5.11)
RDW: 14.1 % (ref 11.5–15.5)
WBC: 18.4 10*3/uL — ABNORMAL HIGH (ref 4.0–10.5)
nRBC: 0 % (ref 0.0–0.2)

## 2021-10-07 LAB — LIPASE, BLOOD: Lipase: 28 U/L (ref 11–51)

## 2021-10-07 LAB — I-STAT BETA HCG BLOOD, ED (MC, WL, AP ONLY): I-stat hCG, quantitative: <5 m[IU]/mL (ref <5)

## 2021-10-07 MED ORDER — IOHEXOL 350 MG/ML SOLN
80.0000 mL | Freq: Once | INTRAVENOUS | Status: AC | PRN
  Administered 2021-10-07: 80 mL via INTRAVENOUS

## 2021-10-07 MED ORDER — PROMETHAZINE HCL 25 MG RE SUPP: 25.0000 mg | Freq: Four times a day (QID) | RECTAL | 0 refills | Status: DC | PRN

## 2021-10-07 MED ORDER — DROPERIDOL 2.5 MG/ML IJ SOLN
2.5000 mg | Freq: Once | INTRAMUSCULAR | Status: AC
  Administered 2021-10-07: 2.5 mg via INTRAVENOUS
  Filled 2021-10-07: qty 2

## 2021-10-07 MED ORDER — PANTOPRAZOLE SODIUM 40 MG IV SOLR
40.0000 mg | Freq: Once | INTRAVENOUS | Status: AC
  Administered 2021-10-07: 40 mg via INTRAVENOUS
  Filled 2021-10-07: qty 40

## 2021-10-07 MED ORDER — ONDANSETRON 4 MG PO TBDP: ORAL_TABLET | ORAL | 0 refills | Status: DC

## 2021-10-07 MED ORDER — ALUM & MAG HYDROXIDE-SIMETH 200-200-20 MG/5ML PO SUSP
30.0000 mL | Freq: Once | ORAL | Status: AC
  Administered 2021-10-07: 30 mL via ORAL
  Filled 2021-10-07: qty 30

## 2021-10-07 MED ORDER — LIDOCAINE VISCOUS HCL 2 % MT SOLN
15.0000 mL | Freq: Once | OROMUCOSAL | Status: AC
  Administered 2021-10-07: 15 mL via ORAL
  Filled 2021-10-07: qty 15

## 2021-10-07 MED ORDER — LACTATED RINGERS IV BOLUS
1000.0000 mL | Freq: Once | INTRAVENOUS | Status: AC
  Administered 2021-10-07: 1000 mL via INTRAVENOUS

## 2021-10-07 NOTE — ED Notes (Signed)
Patient states she is not able to use the bathroom yet.

## 2021-10-07 NOTE — ED Provider Notes (Signed)
25 yo F with a chief complaints of epigastric abdominal pain.  I received the patient in signout from Dr. Clayborne Dana.  Patient plan for CT scan.  CT scan is resulted and is negative.  I have reviewed her lab work which is unremarkable LFTs and lipase are negative.  She is tolerating p.o.  Will prescribe her antiemetics have her take Pepcid or Tagamet at home.  There is a discussion with her earlier about stopping marijuana use.  We will give her GI follow-up.   Melene Plan, DO 10/07/21 [phone]

## 2021-10-07 NOTE — Discharge Instructions (Signed)
Try pepcid or tagamet up to twice a day.  Try to avoid things that may make this worse, most commonly these are spicy foods tomato based products fatty foods chocolate and peppermint.  Alcohol and tobacco can also make this worse.  Return to the emergency department for sudden worsening pain fever or inability to eat or drink.  

## 2021-10-07 NOTE — ED Triage Notes (Signed)
Patient woke up at 9am yesterday morning vomiting. Patient threw up over 20 times and it is a yellow color. Patient has epigastric pain that radiates to her upper back.

## 2021-10-07 NOTE — ED Notes (Signed)
Pt has a labeled urine specimen cup when she is ready to provide a sample.

## 2021-10-07 NOTE — ED Notes (Signed)
Back from Ct.

## 2021-10-07 NOTE — ED Provider Notes (Signed)
[facility]-EMERGENCY DEPT Provider Note   CSN: [id number] Arrival date & time: 10/07/21  0335     History Chief Complaint  Patient presents with   Emesis    [name] Weber is a 25 y.o. female.   Emesis Severity:  Mild Timing:  Constant Quality:  Stomach contents Progression:  Worsening Chronicity:  New Recent urination:  Normal Relieved by:  Nothing Worsened by:  Nothing Ineffective treatments:  None tried Associated symptoms: abdominal pain   Associated symptoms: no URI       Past Medical History:  Diagnosis Date   Chlamydia infection    GDM (gestational diabetes mellitus) 02/2021   Trichomonas 01/2012   UTI (lower urinary tract infection)     Patient Active Problem List   Diagnosis Date Noted   Elevated lipase 06/19/2021   Gestational diabetes 03/07/2021   Alpha thalassemia silent carrier 10/29/2020   Former tobacco use 10/18/2020   Leukocytosis 10/04/2020   Cannabinoid hyperemesis syndrome 11/22/2018   Alcohol abuse 11/22/2018   Nausea and vomiting 11/21/2018   Hypokalemia 11/21/2018   Sickle cell trait (HCC) 07/03/2014    Past Surgical History:  Procedure Laterality Date   DRUG INDUCED ENDOSCOPY     NO PAST SURGERIES       OB History     Gravida  5   Para  4   Term  3   Preterm  1   AB  1   Living  4      SAB  1   IAB  0   Ectopic      Multiple  0   Live Births  4           Family History  Problem Relation Age of Onset   Healthy Mother    Healthy Father    Breast cancer Maternal Grandmother 49   Diabetes Maternal Aunt    Alcohol abuse Neg Hx    Arthritis Neg Hx    Asthma Neg Hx    Birth defects Neg Hx    Cancer Neg Hx    COPD Neg Hx    Depression Neg Hx    Drug abuse Neg Hx    Early death Neg Hx    Hearing loss Neg Hx    Heart disease Neg Hx    Hyperlipidemia Neg Hx    Hypertension Neg Hx    Kidney disease Neg Hx    Learning disabilities Neg Hx    Mental illness Neg Hx     Mental retardation Neg Hx    Miscarriages / Stillbirths Neg Hx    Stroke Neg Hx    Vision loss Neg Hx     Social History   Tobacco Use   Smoking status: Every Day    Packs/day: 0.50    Types: Cigarettes    Last attempt to quit: 10/22/2020    Years since quitting: 0.9   Smokeless tobacco: Never   Tobacco comments:    3 cigs/day  Vaping Use   Vaping Use: Never used  Substance Use Topics   Alcohol use: Not Currently   Drug use: Not Currently    Types: Marijuana    Comment: Last used early October    Home Medications Prior to Admission medications   Medication Sig Start Date End Date Taking? Authorizing Provider  ondansetron (ZOFRAN ODT) 4 MG disintegrating tablet 4mg  ODT q4 hours prn nausea/vomit 10/07/21  Yes 10/09/21, DO  promethazine (PHENERGAN) 25 MG suppository Place 1 suppository (  25 mg total) rectally every 6 (six) hours as needed for nausea or vomiting. 10/07/21  Yes Melene Plan, DO  famotidine (PEPCID) 20 MG tablet Take 20 mg by mouth 2 (two) times daily.    [provider]  medroxyPROGESTERone (DEPO-PROVERA) 150 MG/ML injection Inject 1 mL (150 mg total) into the muscle every 3 (three) months. 08/12/21   Milas Hock, MD  neomycin-bacitracin-polymyxin (NEOSPORIN) [phone] ointment Apply topically 4 (four) times daily. 08/12/21   Milas Hock, MD  Prenatal Vit-Fe Fumarate-FA (PRENATAL MULTIVITAMIN) TABS tablet Take 1 tablet by mouth daily.    [provider]  sucralfate (CARAFATE) 1 g tablet Take 1 tablet (1 g total) by mouth 4 (four) times daily -  with meals and at bedtime. 05/26/21   Gerhard Munch, MD    Allergies    Patient has no known allergies.  Review of Systems   Review of Systems  Gastrointestinal:  Positive for abdominal pain and vomiting.  All other systems reviewed and are negative.  Physical Exam Updated Vital Signs BP 118/75   Pulse (!) 104   Temp 98 F (36.7 C) (Oral)   Resp 17   Ht 5\' 3"  (1.6 m)   Wt 65 kg   SpO2 100%    BMI 25.38 kg/m   Physical Exam Vitals and nursing note reviewed.  Constitutional:      Appearance: She is well-developed.  HENT:     Head: Normocephalic and atraumatic.     Nose: No congestion or rhinorrhea.     Mouth/Throat:     Mouth: Mucous membranes are moist.     Pharynx: Oropharynx is clear.  Eyes:     Pupils: Pupils are equal, round, and reactive to light.  Cardiovascular:     Rate and Rhythm: Normal rate and regular rhythm.  Pulmonary:     Effort: No respiratory distress.     Breath sounds: No stridor.  Abdominal:     General: Abdomen is flat. There is no distension.  Musculoskeletal:        General: No swelling or tenderness.     Cervical back: Normal range of motion.  Skin:    General: Skin is warm and dry.  Neurological:     General: No focal deficit present.     Mental Status: She is alert.    ED Results / Procedures / Treatments   Labs (all labs ordered are listed, but only abnormal results are displayed) Labs Reviewed  COMPREHENSIVE METABOLIC PANEL - Abnormal; Notable for the following components:      Result Value   Glucose, Bld 147 (*)    Total Protein 9.2 (*)    Albumin 5.5 (*)    All other components within normal limits  CBC - Abnormal; Notable for the following components:   WBC 18.4 (*)    RBC 5.34 (*)    MCV 76.4 (*)    All other components within normal limits  URINALYSIS, ROUTINE W REFLEX MICROSCOPIC - Abnormal; Notable for the following components:   Ketones, ur 20 (*)    Protein, ur >=300 (*)    Bacteria, UA RARE (*)    All other components within normal limits  LIPASE, BLOOD  I-STAT BETA HCG BLOOD, ED ([facility], [facility], [facility] ONLY)    EKG None  Radiology   Procedures Procedures   Medications Ordered in ED Medications  droperidol (INAPSINE) 2.5 MG/ML injection 2.5 mg (2.5 mg Intravenous Given 10/07/21 0522)  lactated ringers bolus 1,000 mL (0 mLs Intravenous Stopped 10/07/21  7829)  pantoprazole (PROTONIX) injection 40 mg (40 mg  Intravenous Given 10/07/21 0521)  alum & mag hydroxide-simeth (MAALOX/MYLANTA) 200-200-20 MG/5ML suspension 30 mL (30 mLs Oral Given 10/07/21 0521)    And  lidocaine (XYLOCAINE) 2 % viscous mouth solution 15 mL (15 mLs Oral Given 10/07/21 0521)  iohexol (OMNIPAQUE) 350 MG/ML injection 80 mL (80 mLs Intravenous Contrast Given 10/07/21 5621)    ED Course  I have reviewed the triage vital signs and the nursing notes.  Pertinent labs & imaging results that were available during my care of the patient were reviewed by me and considered in my medical decision making (see chart for details).    MDM Rules/Calculators/A&P                         Vomiting of unclear etiology. Thought she might have pancreatitis but lipase negative so pending ct scan but overall symptoms improved.   Final Clinical Impression(s) / ED Diagnoses Final diagnoses:  Epigastric abdominal pain    Rx / DC Orders ED Discharge Orders          Ordered    ondansetron (ZOFRAN ODT) 4 MG disintegrating tablet        10/07/21 0741    promethazine (PHENERGAN) 25 MG suppository  Every 6 hours PRN        10/07/21 0741             Alek Borges, Barbara Cower, MD 10/07/21 2305

## 2021-10-07 NOTE — ED Notes (Signed)
Patient to CT.

## 2021-10-09 ENCOUNTER — Emergency Department (HOSPITAL_COMMUNITY)
Admission: EM | Admit: 2021-10-09 | Discharge: 2021-10-09 | Discharge status: Against Medical Advice | Payer: Medicaid Other | Attending: Emergency Medicine | Admitting: Emergency Medicine

## 2021-10-09 ENCOUNTER — Other Ambulatory Visit: Payer: Self-pay

## 2021-10-09 ENCOUNTER — Encounter (HOSPITAL_COMMUNITY): Payer: Self-pay | Admitting: Oncology

## 2021-10-09 ENCOUNTER — Emergency Department (HOSPITAL_COMMUNITY): Payer: Medicaid Other

## 2021-10-09 DIAGNOSIS — E876 Hypokalemia: Secondary | ICD-10-CM | POA: Insufficient documentation

## 2021-10-09 DIAGNOSIS — F17210 Nicotine dependence, cigarettes, uncomplicated: Secondary | ICD-10-CM | POA: Insufficient documentation

## 2021-10-09 DIAGNOSIS — R1084 Generalized abdominal pain: Secondary | ICD-10-CM | POA: Diagnosis present

## 2021-10-09 DIAGNOSIS — R112 Nausea with vomiting, unspecified: Secondary | ICD-10-CM | POA: Diagnosis not present

## 2021-10-09 DIAGNOSIS — D72829 Elevated white blood cell count, unspecified: Secondary | ICD-10-CM | POA: Diagnosis not present

## 2021-10-09 LAB — I-STAT BETA HCG BLOOD, ED (MC, WL, AP ONLY): I-stat hCG, quantitative: <5 m[IU]/mL (ref <5)

## 2021-10-09 LAB — COMPREHENSIVE METABOLIC PANEL
ALT: 20 U/L (ref 0–44)
AST: 17 U/L (ref 15–41)
Albumin: 5 g/dL (ref 3.5–5.0)
Alkaline Phosphatase: 42 U/L (ref 38–126)
Anion gap: 12 (ref 5–15)
BUN: 13 mg/dL (ref 6–20)
CO2: 22 mmol/L (ref 22–32)
Calcium: 9.3 mg/dL (ref 8.9–10.3)
Chloride: 101 mmol/L (ref 98–111)
Creatinine, Ser: 0.65 mg/dL (ref 0.44–1.00)
GFR, Estimated: >60 mL/min (ref >60)
Glucose, Bld: 114 mg/dL — ABNORMAL HIGH (ref 70–99)
Potassium: 3 mmol/L — ABNORMAL LOW (ref 3.5–5.1)
Sodium: 135 mmol/L (ref 135–145)
Total Bilirubin: 1.2 mg/dL (ref 0.3–1.2)
Total Protein: 7.9 g/dL (ref 6.5–8.1)

## 2021-10-09 LAB — CBC
HCT: 42.2 % (ref 36.0–46.0)
Hemoglobin: 15 g/dL (ref 12.0–15.0)
MCH: 27.2 pg (ref 26.0–34.0)
MCHC: 35.5 g/dL (ref 30.0–36.0)
MCV: 76.4 fL — ABNORMAL LOW (ref 80.0–100.0)
Platelets: 265 10*3/uL (ref 150–400)
RBC: 5.52 MIL/uL — ABNORMAL HIGH (ref 3.87–5.11)
RDW: 13.8 % (ref 11.5–15.5)
WBC: 13.7 10*3/uL — ABNORMAL HIGH (ref 4.0–10.5)
nRBC: 0 % (ref 0.0–0.2)

## 2021-10-09 LAB — LIPASE, BLOOD: Lipase: 36 U/L (ref 11–51)

## 2021-10-09 MED ORDER — DIPHENHYDRAMINE HCL 50 MG/ML IJ SOLN
12.5000 mg | Freq: Once | INTRAMUSCULAR | Status: AC
  Administered 2021-10-09: 12.5 mg via INTRAVENOUS
  Filled 2021-10-09: qty 1

## 2021-10-09 MED ORDER — SODIUM CHLORIDE 0.9 % IV SOLN: 1000.0000 mL | INTRAVENOUS | Status: DC

## 2021-10-09 MED ORDER — PROCHLORPERAZINE EDISYLATE 10 MG/2ML IJ SOLN
10.0000 mg | Freq: Once | INTRAMUSCULAR | Status: AC
  Administered 2021-10-09: 10 mg via INTRAVENOUS
  Filled 2021-10-09: qty 2

## 2021-10-09 MED ORDER — SODIUM CHLORIDE 0.9 % IV BOLUS (SEPSIS)
1000.0000 mL | Freq: Once | INTRAVENOUS | Status: AC
  Administered 2021-10-09: 1000 mL via INTRAVENOUS

## 2021-10-09 NOTE — ED Provider Notes (Signed)
Emergency Medicine Provider Triage Evaluation Note  [name] Weber , a 25 y.o. female  was evaluated in triage.  Pt complains of abdominal pain.  Review of Systems  Positive: Vomiting abdominal pain Negative: Fever  Physical Exam  BP (!) 150/97   Pulse 99   Temp 99 F (37.2 C) (Oral)   Resp 16   Ht 1.6 m (5\' 3" )   Wt 67.6 kg   SpO2 96%   BMI 26.39 kg/m  Gen:   Awake, appears to be in pain, sitting crouched over forward in a chair Resp:  Normal effort  MSK:   Moves extremities without difficulty  Other:    Medical Decision Making  Medically screening exam initiated at 8:47 AM.  Appropriate orders placed.  [name] Weber was informed that the remainder of the evaluation will be completed by another provider, this initial triage assessment does not replace that evaluation, and the importance of remaining in the ED until their evaluation is complete.  Presents with persistent abdominal pain.  Previously seen a couple days ago.    We will proceed with laboratory test symptomatic treatment, review prior visit.   [name], MD 10/09/21 [phone]

## 2021-10-09 NOTE — ED Provider Notes (Signed)
[facility]-EMERGENCY DEPT Provider Note   CSN: [id number] Arrival date & time: 10/09/21  0809     History Chief Complaint  Patient presents with   Abdominal Pain    [name] Weber is a 25 y.o. female with past medical history significant for STI, cannabinoid hyperemesis syndrome who presents for evaluation nausea, vomiting and abdominal pain.  Symptoms over the last few days.  Seen here in the ED a few days ago and had CT scan which did not show significant findings.  Patient states she is taking ODT Zofran at home without relief.  She denies any marijuana use over the last few days.  Pain located to general abdomen however worse epigastric region.  No radiation. She denies any melena or blood per rectum.  Denies any alcohol use, chronic NSAID use.  Multiple episodes of NBNB emesis.  No fever, chills, chest pain, shortness of breath, back pain, vaginal discharge, UTI symptoms.  No weakness.  Denies additional aggravating or alleviating factors.  History obtained from patient and past medical records.  No interpreter used.  HPI     Past Medical History:  Diagnosis Date   Chlamydia infection    GDM (gestational diabetes mellitus) 02/2021   Trichomonas 01/2012   UTI (lower urinary tract infection)     Patient Active Problem List   Diagnosis Date Noted   Elevated lipase 06/19/2021   Gestational diabetes 03/07/2021   Alpha thalassemia silent carrier 10/29/2020   Former tobacco use 10/18/2020   Leukocytosis 10/04/2020   Cannabinoid hyperemesis syndrome 11/22/2018   Alcohol abuse 11/22/2018   Nausea and vomiting 11/21/2018   Hypokalemia 11/21/2018   Sickle cell trait (HCC) 07/03/2014    Past Surgical History:  Procedure Laterality Date   DRUG INDUCED ENDOSCOPY     NO PAST SURGERIES       OB History     Gravida  5   Para  4   Term  3   Preterm  1   AB  1   Living  4      SAB  1   IAB  0   Ectopic      Multiple  0   Live Births   4           Family History  Problem Relation Age of Onset   Healthy Mother    Healthy Father    Breast cancer Maternal Grandmother 21   Diabetes Maternal Aunt    Alcohol abuse Neg Hx    Arthritis Neg Hx    Asthma Neg Hx    Birth defects Neg Hx    Cancer Neg Hx    COPD Neg Hx    Depression Neg Hx    Drug abuse Neg Hx    Early death Neg Hx    Hearing loss Neg Hx    Heart disease Neg Hx    Hyperlipidemia Neg Hx    Hypertension Neg Hx    Kidney disease Neg Hx    Learning disabilities Neg Hx    Mental illness Neg Hx    Mental retardation Neg Hx    Miscarriages / Stillbirths Neg Hx    Stroke Neg Hx    Vision loss Neg Hx     Social History   Tobacco Use   Smoking status: Every Day    Packs/day: 0.50    Types: Cigarettes    Last attempt to quit: 10/22/2020    Years since quitting: 0.9   Smokeless tobacco:  Never   Tobacco comments:    3 cigs/day  Vaping Use   Vaping Use: Never used  Substance Use Topics   Alcohol use: Not Currently   Drug use: Not Currently    Types: Marijuana    Comment: Last used early October    Home Medications Prior to Admission medications   Medication Sig Start Date End Date Taking? Authorizing Provider  famotidine (PEPCID) 20 MG tablet Take 20 mg by mouth 2 (two) times daily.    [provider]  medroxyPROGESTERone (DEPO-PROVERA) 150 MG/ML injection Inject 1 mL (150 mg total) into the muscle every 3 (three) months. 08/12/21   Milas Hock, MD  neomycin-bacitracin-polymyxin (NEOSPORIN) [phone] ointment Apply topically 4 (four) times daily. 08/12/21   Milas Hock, MD  ondansetron (ZOFRAN ODT) 4 MG disintegrating tablet 4mg  ODT q4 hours prn nausea/vomit 10/07/21   10/09/21, DO  Prenatal Vit-Fe Fumarate-FA (PRENATAL MULTIVITAMIN) TABS tablet Take 1 tablet by mouth daily.    [provider]  promethazine (PHENERGAN) 25 MG suppository Place 1 suppository (25 mg total) rectally every 6 (six) hours as needed for nausea or  vomiting. 10/07/21   10/09/21, DO  sucralfate (CARAFATE) 1 g tablet Take 1 tablet (1 g total) by mouth 4 (four) times daily -  with meals and at bedtime. 05/26/21   07/26/21, MD    Allergies    Patient has no known allergies.  Review of Systems   Review of Systems  Constitutional: Negative.   HENT: Negative.    Respiratory: Negative.    Cardiovascular: Negative.   Gastrointestinal:  Positive for abdominal pain, nausea and vomiting. Negative for abdominal distention, anal bleeding, blood in stool, constipation, diarrhea and rectal pain.  Genitourinary: Negative.   Musculoskeletal: Negative.   Skin: Negative.   Neurological: Negative.   All other systems reviewed and are negative.  Physical Exam Updated Vital Signs BP 117/73   Pulse 86   Temp 99 F (37.2 C) (Oral)   Resp 16   Ht 5\' 3"  (1.6 m)   Wt 67.6 kg   SpO2 100%   BMI 26.39 kg/m   Physical Exam Vitals and nursing note reviewed.  Constitutional:      General: She is not in acute distress.    Appearance: She is well-developed. She is not ill-appearing or toxic-appearing.     Comments: On phone, speaking without difficulty  HENT:     Head: Normocephalic and atraumatic.  Eyes:     Pupils: Pupils are equal, round, and reactive to light.  Cardiovascular:     Rate and Rhythm: Normal rate.     Heart sounds: Normal heart sounds.  Pulmonary:     Effort: Pulmonary effort is normal. No respiratory distress.     Breath sounds: Normal breath sounds.  Abdominal:     General: Bowel sounds are normal. There is no distension.     Palpations: Abdomen is soft.     Tenderness: There is generalized abdominal tenderness. There is no right CVA tenderness, left CVA tenderness, guarding or rebound.  Musculoskeletal:        General: Normal range of motion.     Cervical back: Normal range of motion.  Skin:    General: Skin is warm and dry.  Neurological:     General: No focal deficit present.     Mental Status: She is  alert.  Psychiatric:        Mood and Affect: Mood normal.    ED Results / Procedures /  Treatments   Labs (all labs ordered are listed, but only abnormal results are displayed) Labs Reviewed  COMPREHENSIVE METABOLIC PANEL - Abnormal; Notable for the following components:      Result Value   Potassium 3.0 (*)    Glucose, Bld 114 (*)    All other components within normal limits  CBC - Abnormal; Notable for the following components:   WBC 13.7 (*)    RBC 5.52 (*)    MCV 76.4 (*)    All other components within normal limits  LIPASE, BLOOD  URINALYSIS, ROUTINE W REFLEX MICROSCOPIC  I-STAT BETA HCG BLOOD, ED ([facility], [facility], [facility] ONLY)    EKG None  Radiology DG Abdomen 1 View  Result Date: 10/09/2021 CLINICAL DATA:  Persistent emesis. Additional history provided: Patient reports nausea and vomiting for 4 days. EXAM: ABDOMEN - 1 VIEW COMPARISON:  CT abdomen/pelvis 10/07/2021. FINDINGS: No dilated loops of bowel are demonstrated to suggest small bowel obstruction. Moderate stool burden within the left colon. No evidence of acute bony abnormality. IMPRESSION: No radiographic evidence of small-bowel obstruction. Moderate stool burden within the left colon. Electronically Signed   By: Jackey Loge D.O.   On: 10/09/2021 14:07    Procedures Procedures   Medications Ordered in ED Medications  sodium chloride 0.9 % bolus 1,000 mL (0 mLs Intravenous Stopped 10/09/21 1357)    Followed by  0.9 %  sodium chloride infusion (has no administration in time range)  prochlorperazine (COMPAZINE) injection 10 mg (10 mg Intravenous Given 10/09/21 1227)  diphenhydrAMINE (BENADRYL) injection 12.5 mg (12.5 mg Intravenous Given 10/09/21 1228)   ED Course  I have reviewed the triage vital signs and the nursing notes.  Pertinent labs & imaging results that were available during my care of the patient were reviewed by me and considered in my medical decision making (see chart for details).  25 year old here for  evaluation nausea, vomiting and abdominal pain.  Seen here 2 days ago for similar complaints.  I reviewed her prior imaging of CT scan which not show significant normality.  Taking ODT Zofran without relief.  She is afebrile, nonseptic, not ill-appearing.  No symptoms to suggest upper GI bleed, ulcer, pancreatitis.  Work-up started from triage today personally reviewed and interpreted:  CBC leukocytosis at 13.7, improved from 2 days Metabolic panel hypokalemia at 3.0, glucose 114 Pregnancy test negative Lipase 36 DG abdomen without any free air, does show moderate stool in colon  I went to reassess patient.    Was informed by nursing patient had left AGAINST MEDICAL ADVICE as she stated she had to pick up her child from school.  Aware only after patient had left the ED.        MDM Rules/Calculators/A&P                            Final Clinical Impression(s) / ED Diagnoses Final diagnoses:  Generalized abdominal pain  Nausea and vomiting, unspecified vomiting type  Hypokalemia    Rx / DC Orders ED Discharge Orders     None        Casara Perrier A, PA-C 10/09/21 1413    Mancel Bale, MD 10/09/21 1808

## 2021-10-09 NOTE — ED Notes (Signed)
Pt wishing to leave AMA. Pt informed of risks involved in leaving AMA. Encouraged to return if needed. Pt verbalizes understanding. Ambulatory out of ED at this time.

## 2021-10-09 NOTE — ED Triage Notes (Signed)
Pt bib [facility] from home d/t abdominal pain N/V x 3 days.  Was seen for the same here 3 days ago and given zofran.  Pt reports taking zofran w/o relief.

## 2021-10-16 ENCOUNTER — Ambulatory Visit: Payer: Medicaid Other

## 2021-10-17 ENCOUNTER — Other Ambulatory Visit: Payer: Self-pay

## 2021-10-17 ENCOUNTER — Ambulatory Visit (INDEPENDENT_AMBULATORY_CARE_PROVIDER_SITE_OTHER): Payer: Medicaid Other | Admitting: *Deleted

## 2021-10-17 DIAGNOSIS — Z3009 Encounter for other general counseling and advice on contraception: Secondary | ICD-10-CM | POA: Diagnosis not present

## 2021-10-17 LAB — POCT URINE PREGNANCY: Preg Test, Ur: NEGATIVE

## 2021-10-17 NOTE — Progress Notes (Signed)
Presents for Depo injection. Last injection 07/08/21. Advised need for UPT, no unprotected intercourse for 2 weeks, appointment for UPT and restart in 2 weeks. UPT negative today.

## 2021-10-31 ENCOUNTER — Ambulatory Visit (INDEPENDENT_AMBULATORY_CARE_PROVIDER_SITE_OTHER): Payer: Medicaid Other

## 2021-10-31 ENCOUNTER — Other Ambulatory Visit: Payer: Self-pay

## 2021-10-31 VITALS — BP 110/74 | HR 81 | Ht 63.0 in | Wt 149.0 lb

## 2021-10-31 DIAGNOSIS — Z3042 Encounter for surveillance of injectable contraceptive: Secondary | ICD-10-CM | POA: Diagnosis not present

## 2021-10-31 DIAGNOSIS — Z30013 Encounter for initial prescription of injectable contraceptive: Secondary | ICD-10-CM

## 2021-10-31 LAB — POCT URINE PREGNANCY: Preg Test, Ur: NEGATIVE

## 2021-10-31 MED ORDER — MEDROXYPROGESTERONE ACETATE 150 MG/ML IM SUSP
150.0000 mg | Freq: Once | INTRAMUSCULAR | Status: AC
  Administered 2021-10-31: 150 mg via INTRAMUSCULAR

## 2021-10-31 NOTE — Progress Notes (Signed)
Pt presents today for 2nd upt to restart Depo.  UPT negative.  Depo Provera 150mg  given IM RUOQ. Pt tolerated well with no adverse side effects noted. Pt to return to office between 01/16/22-01/30/22 for repeat injection.

## 2021-12-03 ENCOUNTER — Emergency Department (HOSPITAL_COMMUNITY)
Admission: EM | Admit: 2021-12-03 | Discharge: 2021-12-03 | Discharge status: Against Medical Advice | Payer: Medicaid Other

## 2021-12-03 NOTE — ED Notes (Signed)
Pt called for triage, no answer

## 2021-12-03 NOTE — ED Notes (Signed)
Called for vitals x3, no answer. 

## 2021-12-04 ENCOUNTER — Emergency Department (HOSPITAL_COMMUNITY)
Admission: EM | Admit: 2021-12-04 | Discharge: 2021-12-04 | Disposition: A | Discharge status: Home / Self Care | Payer: Medicaid Other | Attending: Emergency Medicine | Admitting: Emergency Medicine

## 2021-12-04 ENCOUNTER — Encounter (HOSPITAL_COMMUNITY): Payer: Self-pay

## 2021-12-04 ENCOUNTER — Emergency Department (HOSPITAL_COMMUNITY): Payer: Medicaid Other

## 2021-12-04 ENCOUNTER — Other Ambulatory Visit: Payer: Self-pay

## 2021-12-04 DIAGNOSIS — Z20822 Contact with and (suspected) exposure to covid-19: Secondary | ICD-10-CM | POA: Diagnosis not present

## 2021-12-04 DIAGNOSIS — R1013 Epigastric pain: Secondary | ICD-10-CM | POA: Diagnosis not present

## 2021-12-04 DIAGNOSIS — R112 Nausea with vomiting, unspecified: Secondary | ICD-10-CM

## 2021-12-04 DIAGNOSIS — E876 Hypokalemia: Secondary | ICD-10-CM

## 2021-12-04 DIAGNOSIS — F17210 Nicotine dependence, cigarettes, uncomplicated: Secondary | ICD-10-CM | POA: Diagnosis not present

## 2021-12-04 DIAGNOSIS — N9489 Other specified conditions associated with female genital organs and menstrual cycle: Secondary | ICD-10-CM | POA: Diagnosis not present

## 2021-12-04 LAB — I-STAT BETA HCG BLOOD, ED (MC, WL, AP ONLY): I-stat hCG, quantitative: <5 m[IU]/mL (ref <5)

## 2021-12-04 LAB — RESP PANEL BY RT-PCR (FLU A&B, COVID) ARPGX2
Influenza A by PCR: NEGATIVE
Influenza B by PCR: NEGATIVE
SARS Coronavirus 2 by RT PCR: NEGATIVE

## 2021-12-04 LAB — COMPREHENSIVE METABOLIC PANEL
ALT: 20 U/L (ref 0–44)
AST: 18 U/L (ref 15–41)
Albumin: 5.1 g/dL — ABNORMAL HIGH (ref 3.5–5.0)
Alkaline Phosphatase: 42 U/L (ref 38–126)
Anion gap: 12 (ref 5–15)
BUN: 11 mg/dL (ref 6–20)
CO2: 22 mmol/L (ref 22–32)
Calcium: 9.5 mg/dL (ref 8.9–10.3)
Chloride: 99 mmol/L (ref 98–111)
Creatinine, Ser: 0.76 mg/dL (ref 0.44–1.00)
GFR, Estimated: >60 mL/min (ref >60)
Glucose, Bld: 112 mg/dL — ABNORMAL HIGH (ref 70–99)
Potassium: 3.3 mmol/L — ABNORMAL LOW (ref 3.5–5.1)
Sodium: 133 mmol/L — ABNORMAL LOW (ref 135–145)
Total Bilirubin: 1.4 mg/dL — ABNORMAL HIGH (ref 0.3–1.2)
Total Protein: 8.7 g/dL — ABNORMAL HIGH (ref 6.5–8.1)

## 2021-12-04 LAB — CBC WITH DIFFERENTIAL/PLATELET
Abs Immature Granulocytes: 0.05 10*3/uL (ref 0.00–0.07)
Basophils Absolute: 0 10*3/uL (ref 0.0–0.1)
Basophils Relative: 0 %
Eosinophils Absolute: 0 10*3/uL (ref 0.0–0.5)
Eosinophils Relative: 0 %
HCT: 42.4 % (ref 36.0–46.0)
Hemoglobin: 15.3 g/dL — ABNORMAL HIGH (ref 12.0–15.0)
Immature Granulocytes: 0 %
Lymphocytes Relative: 14 %
Lymphs Abs: 2.2 10*3/uL (ref 0.7–4.0)
MCH: 27.1 pg (ref 26.0–34.0)
MCHC: 36.1 g/dL — ABNORMAL HIGH (ref 30.0–36.0)
MCV: 75.2 fL — ABNORMAL LOW (ref 80.0–100.0)
Monocytes Absolute: 1.2 10*3/uL — ABNORMAL HIGH (ref 0.1–1.0)
Monocytes Relative: 7 %
Neutro Abs: 12.5 10*3/uL — ABNORMAL HIGH (ref 1.7–7.7)
Neutrophils Relative %: 79 %
Platelets: 277 10*3/uL (ref 150–400)
RBC: 5.64 MIL/uL — ABNORMAL HIGH (ref 3.87–5.11)
RDW: 13.6 % (ref 11.5–15.5)
WBC: 15.9 10*3/uL — ABNORMAL HIGH (ref 4.0–10.5)
nRBC: 0 % (ref 0.0–0.2)

## 2021-12-04 LAB — LIPASE, BLOOD: Lipase: 110 U/L — ABNORMAL HIGH (ref 11–51)

## 2021-12-04 MED ORDER — MORPHINE SULFATE (PF) 4 MG/ML IV SOLN
4.0000 mg | Freq: Once | INTRAVENOUS | Status: AC
  Administered 2021-12-04: 04:00:00 4 mg via INTRAVENOUS
  Filled 2021-12-04: qty 1

## 2021-12-04 MED ORDER — LACTATED RINGERS IV BOLUS
1500.0000 mL | Freq: Once | INTRAVENOUS | Status: AC
  Administered 2021-12-04: 04:00:00 1500 mL via INTRAVENOUS

## 2021-12-04 MED ORDER — ALUM & MAG HYDROXIDE-SIMETH 200-200-20 MG/5ML PO SUSP
30.0000 mL | Freq: Once | ORAL | Status: AC
  Administered 2021-12-04: 05:00:00 30 mL via ORAL
  Filled 2021-12-04: qty 30

## 2021-12-04 MED ORDER — SODIUM CHLORIDE (PF) 0.9 % IJ SOLN
INTRAMUSCULAR | Status: AC
  Filled 2021-12-04: qty 50

## 2021-12-04 MED ORDER — FAMOTIDINE 20 MG PO TABS
20.0000 mg | ORAL_TABLET | Freq: Once | ORAL | Status: AC
  Administered 2021-12-04: 05:00:00 20 mg via ORAL
  Filled 2021-12-04: qty 1

## 2021-12-04 MED ORDER — ONDANSETRON HCL 4 MG/2ML IJ SOLN
4.0000 mg | Freq: Once | INTRAMUSCULAR | Status: AC
  Administered 2021-12-04: 03:00:00 4 mg via INTRAVENOUS
  Filled 2021-12-04: qty 2

## 2021-12-04 MED ORDER — POTASSIUM CHLORIDE CRYS ER 20 MEQ PO TBCR
40.0000 meq | EXTENDED_RELEASE_TABLET | Freq: Once | ORAL | Status: AC
  Administered 2021-12-04: 05:00:00 40 meq via ORAL
  Filled 2021-12-04: qty 2

## 2021-12-04 MED ORDER — LIDOCAINE VISCOUS HCL 2 % MT SOLN
15.0000 mL | Freq: Once | OROMUCOSAL | Status: AC
  Administered 2021-12-04: 05:00:00 15 mL via ORAL
  Filled 2021-12-04: qty 15

## 2021-12-04 MED ORDER — PANTOPRAZOLE SODIUM 20 MG PO TBEC: 20.0000 mg | DELAYED_RELEASE_TABLET | Freq: Every day | ORAL | 0 refills | Status: DC

## 2021-12-04 MED ORDER — ONDANSETRON 4 MG PO TBDP
4.0000 mg | ORAL_TABLET | Freq: Three times a day (TID) | ORAL | 0 refills | Status: DC | PRN
Start: 2021-12-04 — End: 2023-01-21

## 2021-12-04 MED ORDER — IOHEXOL 350 MG/ML SOLN
80.0000 mL | Freq: Once | INTRAVENOUS | Status: AC | PRN
  Administered 2021-12-04: 04:00:00 80 mL via INTRAVENOUS

## 2021-12-04 NOTE — ED Provider Notes (Signed)
[facility]-EMERGENCY DEPT Provider Note   CSN: [id number] Arrival date & time: 12/04/21  0108     History Chief Complaint  Patient presents with   Abdominal Pain   Emesis    [name] Weber is a 25 y.o. female.  HPI  Patient is a 25 year old female with a medical history as noted below.  She presents to the emergency department due to epigastric pain as well as intractable nausea/vomiting for the past 3 days.  Patient states she has a history of GERD and has been out of her Protonix for about 1 month.  States her symptoms began worsening 3 days ago and have been persistent.  States that she feels dehydrated.  Denies any URI symptoms, diarrhea, urinary complaints.  States that she stopped smoking marijuana about 1 month ago.     Past Medical History:  Diagnosis Date   Chlamydia infection    GDM (gestational diabetes mellitus) 02/2021   Trichomonas 01/2012   UTI (lower urinary tract infection)     Patient Active Problem List   Diagnosis Date Noted   Elevated lipase 06/19/2021   Gestational diabetes 03/07/2021   Alpha thalassemia silent carrier 10/29/2020   Former tobacco use 10/18/2020   Leukocytosis 10/04/2020   Cannabinoid hyperemesis syndrome 11/22/2018   Alcohol abuse 11/22/2018   Nausea and vomiting 11/21/2018   Hypokalemia 11/21/2018   Sickle cell trait (HCC) 07/03/2014    Past Surgical History:  Procedure Laterality Date   DRUG INDUCED ENDOSCOPY     NO PAST SURGERIES       OB History     Gravida  5   Para  4   Term  3   Preterm  1   AB  1   Living  4      SAB  1   IAB  0   Ectopic      Multiple  0   Live Births  4           Family History  Problem Relation Age of Onset   Healthy Mother    Healthy Father    Breast cancer Maternal Grandmother 8   Diabetes Maternal Aunt    Alcohol abuse Neg Hx    Arthritis Neg Hx    Asthma Neg Hx    Birth defects Neg Hx    Cancer Neg Hx    COPD Neg Hx    Depression  Neg Hx    Drug abuse Neg Hx    Early death Neg Hx    Hearing loss Neg Hx    Heart disease Neg Hx    Hyperlipidemia Neg Hx    Hypertension Neg Hx    Kidney disease Neg Hx    Learning disabilities Neg Hx    Mental illness Neg Hx    Mental retardation Neg Hx    Miscarriages / Stillbirths Neg Hx    Stroke Neg Hx    Vision loss Neg Hx     Social History   Tobacco Use   Smoking status: Every Day    Packs/day: 0.50    Types: Cigarettes    Last attempt to quit: 10/22/2020    Years since quitting: 1.1   Smokeless tobacco: Never   Tobacco comments:    3 cigs/day  Vaping Use   Vaping Use: Never used  Substance Use Topics   Alcohol use: Not Currently   Drug use: Not Currently    Types: Marijuana    Comment: Last used early  October    Home Medications Prior to Admission medications   Medication Sig Start Date End Date Taking? Authorizing Provider  ondansetron (ZOFRAN-ODT) 4 MG disintegrating tablet Take 1 tablet (4 mg total) by mouth every 8 (eight) hours as needed for nausea or vomiting. 12/04/21  Yes Placido Sou, PA-C  pantoprazole (PROTONIX) 20 MG tablet Take 1 tablet (20 mg total) by mouth daily. 12/04/21  Yes Placido Sou, PA-C  famotidine (PEPCID) 20 MG tablet Take 20 mg by mouth 2 (two) times daily. Patient not taking: Reported on 10/31/2021    [provider]  medroxyPROGESTERone (DEPO-PROVERA) 150 MG/ML injection Inject 1 mL (150 mg total) into the muscle every 3 (three) months. 08/12/21   Milas Hock, MD  neomycin-bacitracin-polymyxin (NEOSPORIN) [phone] ointment Apply topically 4 (four) times daily. Patient not taking: Reported on 10/31/2021 08/12/21   Milas Hock, MD  Prenatal Vit-Fe Fumarate-FA (PRENATAL MULTIVITAMIN) TABS tablet Take 1 tablet by mouth daily. Patient not taking: Reported on 10/31/2021    [provider]  promethazine (PHENERGAN) 25 MG suppository Place 1 suppository (25 mg total) rectally every 6 (six) hours as needed for  nausea or vomiting. Patient not taking: Reported on 10/31/2021 10/07/21   Melene Plan, DO  sucralfate (CARAFATE) 1 g tablet Take 1 tablet (1 g total) by mouth 4 (four) times daily -  with meals and at bedtime. Patient not taking: Reported on 10/31/2021 05/26/21   Gerhard Munch, MD    Allergies    Patient has no known allergies.  Review of Systems   Review of Systems  All other systems reviewed and are negative. Ten systems reviewed and are negative for acute change, except as noted in the HPI.   Physical Exam Updated Vital Signs BP (!) 149/101    Pulse 94    Temp 99.1 F (37.3 C) (Oral)    Resp 17    SpO2 99%   Physical Exam Vitals and nursing note reviewed.  Constitutional:      General: She is not in acute distress.    Appearance: Normal appearance. She is well-developed and normal weight. She is not ill-appearing, toxic-appearing or diaphoretic.  HENT:     Head: Normocephalic and atraumatic.     Right Ear: External ear normal.     Left Ear: External ear normal.     Nose: Nose normal.     Mouth/Throat:     Mouth: Mucous membranes are moist.     Pharynx: Oropharynx is clear. No oropharyngeal exudate or posterior oropharyngeal erythema.  Eyes:     Extraocular Movements: Extraocular movements intact.  Cardiovascular:     Rate and Rhythm: Regular rhythm. Tachycardia present.     Pulses: Normal pulses.     Heart sounds: Normal heart sounds. No murmur heard.   No friction rub. No gallop.  Pulmonary:     Effort: Pulmonary effort is normal. No respiratory distress.     Breath sounds: Normal breath sounds. No stridor. No wheezing, rhonchi or rales.  Abdominal:     General: Abdomen is flat.     Palpations: Abdomen is soft.     Tenderness: There is abdominal tenderness in the epigastric area.  Musculoskeletal:        General: Normal range of motion.     Cervical back: Normal range of motion and neck supple. No tenderness.  Skin:    General: Skin is warm and dry.   Neurological:     General: No focal deficit present.     Mental Status:  She is alert and oriented to person, place, and time.  Psychiatric:        Mood and Affect: Mood normal.        Behavior: Behavior normal.    ED Results / Procedures / Treatments   Labs (all labs ordered are listed, but only abnormal results are displayed) Labs Reviewed  CBC WITH DIFFERENTIAL/PLATELET - Abnormal; Notable for the following components:      Result Value   WBC 15.9 (*)    RBC 5.64 (*)    Hemoglobin 15.3 (*)    MCV 75.2 (*)    MCHC 36.1 (*)    Neutro Abs 12.5 (*)    Monocytes Absolute 1.2 (*)    All other components within normal limits  COMPREHENSIVE METABOLIC PANEL - Abnormal; Notable for the following components:   Sodium 133 (*)    Potassium 3.3 (*)    Glucose, Bld 112 (*)    Total Protein 8.7 (*)    Albumin 5.1 (*)    Total Bilirubin 1.4 (*)    All other components within normal limits  LIPASE, BLOOD - Abnormal; Notable for the following components:   Lipase 110 (*)    All other components within normal limits  RESP PANEL BY RT-PCR (FLU A&B, COVID) ARPGX2  I-STAT BETA HCG BLOOD, ED ([facility], [facility], [facility] ONLY)   EKG None  Radiology CT ABDOMEN PELVIS W CONTRAST  Result Date: 12/04/2021 CLINICAL DATA:  Epigastric pain.  Elevated lipase EXAM: CT ABDOMEN AND PELVIS WITH CONTRAST TECHNIQUE: Multidetector CT imaging of the abdomen and pelvis was performed using the standard protocol following bolus administration of intravenous contrast. CONTRAST:  80mL OMNIPAQUE IOHEXOL 350 MG/ML SOLN COMPARISON:  10/07/2021 FINDINGS: Lower chest:  No contributory findings. Hepatobiliary: No focal liver abnormality.No evidence of biliary obstruction or stone. Pancreas: Unremarkable. Spleen: Unremarkable. Adrenals/Urinary Tract: Negative adrenals. No hydronephrosis or stone. Unremarkable bladder. Stomach/Bowel:  No obstruction. No appendicitis. Vascular/Lymphatic: No acute vascular abnormality. No mass or adenopathy.  Reproductive:No pathologic findings. Other: No ascites or pneumoperitoneum. Musculoskeletal: No acute abnormalities. IMPRESSION: Negative abdominal CT. Electronically Signed   By: Tiburcio Pea M.D.   On: 12/04/2021 04:30    Procedures Procedures   Medications Ordered in ED Medications  sodium chloride (PF) 0.9 % injection (has no administration in time range)  morphine 4 MG/ML injection 4 mg (4 mg Intravenous Given 12/04/21 0330)  lactated ringers bolus 1,500 mL (0 mLs Intravenous Stopped 12/04/21 0506)  ondansetron (ZOFRAN) injection 4 mg (4 mg Intravenous Given 12/04/21 0329)  alum & mag hydroxide-simeth (MAALOX/MYLANTA) 200-200-20 MG/5ML suspension 30 mL (30 mLs Oral Given 12/04/21 0438)    And  lidocaine (XYLOCAINE) 2 % viscous mouth solution 15 mL (15 mLs Oral Given 12/04/21 0438)  potassium chloride SA (KLOR-CON M) CR tablet 40 mEq (40 mEq Oral Given 12/04/21 0438)  famotidine (PEPCID) tablet 20 mg (20 mg Oral Given 12/04/21 0438)  iohexol (OMNIPAQUE) 350 MG/ML injection 80 mL (80 mLs Intravenous Contrast Given 12/04/21 0409)   ED Course  I have reviewed the triage vital signs and the nursing notes.  Pertinent labs & imaging results that were available during my care of the patient were reviewed by me and considered in my medical decision making (see chart for details).  Clinical Course as of 12/04/21 0531  Wed Dec 04, 2021  0503 Patient reassessed.  Tolerating p.o. intake.  Lying comfortably in bed.  We will continue to monitor.  Will likely discharge. [LJ]    Clinical Course User  Index [LJ] Placido Sou, PA-C   MDM Rules/Calculators/A&P                          Pt is a 25 y.o. female who presents to the emergency department due to upper abdominal pain as well as nausea/vomiting.  Symptoms started 3 days ago.  Reports history of similar symptoms and states that she ran out of her Protonix about 1 month ago.  Labs: CBC with a Vandruff count of 15.9, hemoglobin of 15.3,  MCV of 75.2, MCHC of 36.1, neutrophils of 12.5, monocytes of 1.2. CMP with a sodium of 133, potassium of 3.3, glucose of 112, total protein of 8.7, albumin of 5.1, total bilirubin of 1.4. Lipase of 110. I-STAT beta-hCG less than 5. Respiratory panel is negative.  Imaging: CT scan obtained of the abdomen/pelvis with contrast is negative.  I, Placido Sou, PA-C, personally reviewed and evaluated these images and lab results as part of my medical decision-making.  Symptoms appear to be consistent with gastritis.  Patient with moderate tenderness along the epigastrium.  Leukocytosis of 15.9 that is likely reactive.  Patient afebrile.  Tachycardia has resolved.  Doubt infectious etiology at this time.  Elevated hemoglobin at 15.3.  Likely due to hemoconcentration.  Patient given 1500 cc of lactated Ringer's.  CMP with a mild hypokalemia of 3.3.  This was repleted with Klor-Con.  Lipase moderately elevated at 110.  I obtained a CT scan of the abdomen/pelvis which is negative.  Doubt acute pancreatitis at this time.  Patient treated with GI cocktail, Pepcid, morphine, Zofran.  Reports moderate improvement in her symptoms.  Tolerating p.o. intake.  Feel that the patient is stable for discharge at this time and she is agreeable.  Will discharge on a course of Zofran for breakthrough nausea/vomiting.  Will refill patient's Protonix.  We discussed return precautions.  Recommended follow-up with her gastroenterologist.  Her questions were answered and she was amicable at the time of discharge.  Note: Portions of this report may have been transcribed using voice recognition software. Every effort was made to ensure accuracy; however, inadvertent computerized transcription errors may be present.   Final Clinical Impression(s) / ED Diagnoses Final diagnoses:  Non-intractable vomiting with nausea  Epigastric pain  Hypokalemia   Rx / DC Orders ED Discharge Orders          Ordered    ondansetron  (ZOFRAN-ODT) 4 MG disintegrating tablet  Every 8 hours PRN        12/04/21 0509    pantoprazole (PROTONIX) 20 MG tablet  Daily        12/04/21 0509             Placido Sou, PA-C 12/04/21 0533    Molpus, Jonny Ruiz, MD 12/04/21 [phone]

## 2021-12-04 NOTE — ED Notes (Signed)
Pt feeling to nauseous at this time to take PO meds. EDP aware

## 2021-12-04 NOTE — ED Triage Notes (Signed)
Pt reports with mid upper abdominal pain and vomiting x 3 days. Pt states that she has been off of her Protonix x 1 month.

## 2021-12-04 NOTE — Discharge Instructions (Addendum)
I am prescribing you a medication called Zofran.  This is a disintegrating tablet you can use up to 3 times a day for management of your nausea and vomiting.  Please only take this as prescribed.  Please only take this if you are experiencing nausea and vomiting that you cannot control.  I am also prescribing you Protonix.  This is an antacid that will hopefully help with your abdominal pain.  Please follow-up with [facility].  You develop any new or worsening symptoms please come back to the emergency department.

## 2021-12-05 ENCOUNTER — Other Ambulatory Visit: Payer: Self-pay

## 2021-12-05 ENCOUNTER — Emergency Department (HOSPITAL_COMMUNITY): Payer: Medicaid Other

## 2021-12-05 ENCOUNTER — Encounter (HOSPITAL_COMMUNITY): Payer: Self-pay | Admitting: Emergency Medicine

## 2021-12-05 ENCOUNTER — Emergency Department (HOSPITAL_COMMUNITY)
Admission: EM | Admit: 2021-12-05 | Discharge: 2021-12-05 | Disposition: A | Discharge status: Home / Self Care | Payer: Medicaid Other | Attending: Emergency Medicine | Admitting: Emergency Medicine

## 2021-12-05 DIAGNOSIS — F1290 Cannabis use, unspecified, uncomplicated: Secondary | ICD-10-CM

## 2021-12-05 DIAGNOSIS — F12188 Cannabis abuse with other cannabis-induced disorder: Secondary | ICD-10-CM | POA: Insufficient documentation

## 2021-12-05 DIAGNOSIS — E876 Hypokalemia: Secondary | ICD-10-CM

## 2021-12-05 DIAGNOSIS — F17210 Nicotine dependence, cigarettes, uncomplicated: Secondary | ICD-10-CM | POA: Insufficient documentation

## 2021-12-05 DIAGNOSIS — N9489 Other specified conditions associated with female genital organs and menstrual cycle: Secondary | ICD-10-CM | POA: Diagnosis not present

## 2021-12-05 LAB — COMPREHENSIVE METABOLIC PANEL
ALT: 16 U/L (ref 0–44)
AST: 16 U/L (ref 15–41)
Albumin: 4.8 g/dL (ref 3.5–5.0)
Alkaline Phosphatase: 38 U/L (ref 38–126)
Anion gap: 10 (ref 5–15)
BUN: 12 mg/dL (ref 6–20)
CO2: 21 mmol/L — ABNORMAL LOW (ref 22–32)
Calcium: 8.9 mg/dL (ref 8.9–10.3)
Chloride: 101 mmol/L (ref 98–111)
Creatinine, Ser: 0.79 mg/dL (ref 0.44–1.00)
GFR, Estimated: >60 mL/min (ref >60)
Glucose, Bld: 96 mg/dL (ref 70–99)
Potassium: 3 mmol/L — ABNORMAL LOW (ref 3.5–5.1)
Sodium: 132 mmol/L — ABNORMAL LOW (ref 135–145)
Total Bilirubin: 1.4 mg/dL — ABNORMAL HIGH (ref 0.3–1.2)
Total Protein: 7.8 g/dL (ref 6.5–8.1)

## 2021-12-05 LAB — URINALYSIS, ROUTINE W REFLEX MICROSCOPIC
Glucose, UA: NEGATIVE mg/dL
Ketones, ur: >=80 mg/dL — AB
Nitrite: NEGATIVE
Protein, ur: 30 mg/dL — AB
Specific Gravity, Urine: 1.02 (ref 1.005–1.030)
pH: 7 (ref 5.0–8.0)

## 2021-12-05 LAB — RAPID URINE DRUG SCREEN, HOSP PERFORMED
Amphetamines: NOT DETECTED
Barbiturates: NOT DETECTED
Benzodiazepines: NOT DETECTED
Cocaine: NOT DETECTED
Opiates: POSITIVE — AB
Tetrahydrocannabinol: POSITIVE — AB

## 2021-12-05 LAB — CBC
HCT: 40.7 % (ref 36.0–46.0)
Hemoglobin: 14.6 g/dL (ref 12.0–15.0)
MCH: 27.3 pg (ref 26.0–34.0)
MCHC: 35.9 g/dL (ref 30.0–36.0)
MCV: 76.1 fL — ABNORMAL LOW (ref 80.0–100.0)
Platelets: 250 10*3/uL (ref 150–400)
RBC: 5.35 MIL/uL — ABNORMAL HIGH (ref 3.87–5.11)
RDW: 13.4 % (ref 11.5–15.5)
WBC: 12.6 10*3/uL — ABNORMAL HIGH (ref 4.0–10.5)
nRBC: 0 % (ref 0.0–0.2)

## 2021-12-05 LAB — I-STAT BETA HCG BLOOD, ED (MC, WL, AP ONLY): I-stat hCG, quantitative: <5 m[IU]/mL (ref <5)

## 2021-12-05 LAB — LIPASE, BLOOD: Lipase: 59 U/L — ABNORMAL HIGH (ref 11–51)

## 2021-12-05 MED ORDER — SODIUM CHLORIDE 0.9 % IV BOLUS
1000.0000 mL | Freq: Once | INTRAVENOUS | Status: AC
  Administered 2021-12-05: 1000 mL via INTRAVENOUS

## 2021-12-05 MED ORDER — POTASSIUM CHLORIDE 10 MEQ/100ML IV SOLN
10.0000 meq | INTRAVENOUS | Status: DC
  Administered 2021-12-05: 10 meq via INTRAVENOUS
  Filled 2021-12-05 (×2): qty 100

## 2021-12-05 MED ORDER — ONDANSETRON HCL 4 MG/2ML IJ SOLN
4.0000 mg | Freq: Once | INTRAMUSCULAR | Status: AC
  Administered 2021-12-05: 4 mg via INTRAVENOUS
  Filled 2021-12-05: qty 2

## 2021-12-05 MED ORDER — DIPHENHYDRAMINE HCL 50 MG/ML IJ SOLN
INTRAMUSCULAR | Status: AC
  Administered 2021-12-05: 50 mg via INTRAVENOUS
  Filled 2021-12-05: qty 1

## 2021-12-05 MED ORDER — HALOPERIDOL LACTATE 5 MG/ML IJ SOLN
5.0000 mg | Freq: Once | INTRAMUSCULAR | Status: AC
  Administered 2021-12-05: 5 mg via INTRAVENOUS
  Filled 2021-12-05: qty 1

## 2021-12-05 MED ORDER — DEXAMETHASONE SODIUM PHOSPHATE 10 MG/ML IJ SOLN
10.0000 mg | Freq: Once | INTRAMUSCULAR | Status: AC
  Administered 2021-12-05: 10 mg via INTRAVENOUS
  Filled 2021-12-05: qty 1

## 2021-12-05 MED ORDER — DIPHENHYDRAMINE HCL 50 MG/ML IJ SOLN: 50.0000 mg | Freq: Once | INTRAMUSCULAR | Status: AC

## 2021-12-05 NOTE — ED Notes (Signed)
Pt given gingerale at this time for PO challenge. ED RN educated pt on small sips.

## 2021-12-05 NOTE — ED Notes (Signed)
Pt states understanding of dc instructions and importance of follow up. Pt denies questions or concerns upon dc. Pt declined wheelchair assistance upon dc. Pt ambulated out of ed w/ steady gait. No belongings left in room upon dc.

## 2021-12-05 NOTE — Discharge Instructions (Addendum)
Please avoid marijuana use as it may contribute to your symptoms.  Eat a banana daily for the next week as well as take prenatal vitamin to help replenish your potassium as it is low today.  Have a recheck at your earliest convenience.  Return to ER if you have any concern.

## 2021-12-05 NOTE — ED Notes (Signed)
ED RN to MD in person to inform of possible allergic reaction.  PA to room to assess pt.  Verbal orders given, see MAR for orders and Medication administration.

## 2021-12-05 NOTE — ED Triage Notes (Signed)
BIB EMS from home, N/V x3 days. Also complains of generalized lower back pain, ODT Zofran w/o relief. CBG 98, reports she has not eaten in 4 days.

## 2021-12-05 NOTE — ED Provider Notes (Signed)
[facility]-EMERGENCY DEPT Provider Note   CSN: [id number] Arrival date & time: 12/05/21  4696     History Chief Complaint  Patient presents with   Emesis   Back Pain   Abdominal Pain    [name] Weber is a 25 y.o. female.  The history is provided by the patient and medical records. No language interpreter was used.  Emesis Associated symptoms: abdominal pain   Back Pain Associated symptoms: abdominal pain   Abdominal Pain Associated symptoms: vomiting    25 year old female with significant history of cannabinoid hyperemesis syndrome, alcohol use, sickle cell trait brought here via EMS from home with complaints of nausea and vomiting.  Patient report for the past 4 days she has had persistent pain.  She described pain as a constant sensation across her Abdomen with associated nausea and vomiting.  Vomitus is nonbloody nonbilious.  No diarrhea or constipation.  She has been unable to eat or drink anything.  The pain is persistent and moderate to severe.  States she has had symptoms like this in the past.  Nothing seems to make it better or worse.  Denies fever runny nose sneezing or coughing or dysuria.  Last marijuana use was 3 weeks ago and last alcohol use was 2 years ago according to patient.  Past Medical History:  Diagnosis Date   Chlamydia infection    GDM (gestational diabetes mellitus) 02/2021   Trichomonas 01/2012   UTI (lower urinary tract infection)     Patient Active Problem List   Diagnosis Date Noted   Elevated lipase 06/19/2021   Gestational diabetes 03/07/2021   Alpha thalassemia silent carrier 10/29/2020   Former tobacco use 10/18/2020   Leukocytosis 10/04/2020   Cannabinoid hyperemesis syndrome 11/22/2018   Alcohol abuse 11/22/2018   Nausea and vomiting 11/21/2018   Hypokalemia 11/21/2018   Sickle cell trait (HCC) 07/03/2014    Past Surgical History:  Procedure Laterality Date   DRUG INDUCED ENDOSCOPY     NO PAST SURGERIES        OB History     Gravida  5   Para  4   Term  3   Preterm  1   AB  1   Living  4      SAB  1   IAB  0   Ectopic      Multiple  0   Live Births  4           Family History  Problem Relation Age of Onset   Healthy Mother    Healthy Father    Breast cancer Maternal Grandmother 69   Diabetes Maternal Aunt    Alcohol abuse Neg Hx    Arthritis Neg Hx    Asthma Neg Hx    Birth defects Neg Hx    Cancer Neg Hx    COPD Neg Hx    Depression Neg Hx    Drug abuse Neg Hx    Early death Neg Hx    Hearing loss Neg Hx    Heart disease Neg Hx    Hyperlipidemia Neg Hx    Hypertension Neg Hx    Kidney disease Neg Hx    Learning disabilities Neg Hx    Mental illness Neg Hx    Mental retardation Neg Hx    Miscarriages / Stillbirths Neg Hx    Stroke Neg Hx    Vision loss Neg Hx     Social History   Tobacco Use  Smoking status: Every Day    Packs/day: 0.50    Types: Cigarettes    Last attempt to quit: 10/22/2020    Years since quitting: 1.1   Smokeless tobacco: Never   Tobacco comments:    3 cigs/day  Vaping Use   Vaping Use: Never used  Substance Use Topics   Alcohol use: Not Currently   Drug use: Not Currently    Types: Marijuana    Comment: Last used early October    Home Medications Prior to Admission medications   Medication Sig Start Date End Date Taking? Authorizing Provider  famotidine (PEPCID) 20 MG tablet Take 20 mg by mouth 2 (two) times daily. Patient not taking: Reported on 10/31/2021    [provider]  medroxyPROGESTERone (DEPO-PROVERA) 150 MG/ML injection Inject 1 mL (150 mg total) into the muscle every 3 (three) months. 08/12/21   Milas Hock, MD  neomycin-bacitracin-polymyxin (NEOSPORIN) [phone] ointment Apply topically 4 (four) times daily. Patient not taking: Reported on 10/31/2021 08/12/21   Milas Hock, MD  ondansetron (ZOFRAN-ODT) 4 MG disintegrating tablet Take 1 tablet (4 mg total) by mouth every 8 (eight)  hours as needed for nausea or vomiting. 12/04/21   Placido Sou, PA-C  pantoprazole (PROTONIX) 20 MG tablet Take 1 tablet (20 mg total) by mouth daily. 12/04/21   Placido Sou, PA-C  Prenatal Vit-Fe Fumarate-FA (PRENATAL MULTIVITAMIN) TABS tablet Take 1 tablet by mouth daily. Patient not taking: Reported on 10/31/2021    [provider]  promethazine (PHENERGAN) 25 MG suppository Place 1 suppository (25 mg total) rectally every 6 (six) hours as needed for nausea or vomiting. Patient not taking: Reported on 10/31/2021 10/07/21   Melene Plan, DO  sucralfate (CARAFATE) 1 g tablet Take 1 tablet (1 g total) by mouth 4 (four) times daily -  with meals and at bedtime. Patient not taking: Reported on 10/31/2021 05/26/21   Gerhard Munch, MD    Allergies    Patient has no known allergies.  Review of Systems   Review of Systems  Gastrointestinal:  Positive for abdominal pain and vomiting.  Musculoskeletal:  Positive for back pain.  All other systems reviewed and are negative.  Physical Exam Updated Vital Signs BP (!) 141/114 (BP Location: Right Arm)    Pulse 88    Temp 99.2 F (37.3 C) (Oral)    Resp 15    SpO2 100%   Physical Exam Vitals and nursing note reviewed.  Constitutional:      General: She is not in acute distress.    Appearance: She is well-developed.     Comments: Patient appears uncomfortable but nontoxic  HENT:     Head: Atraumatic.  Eyes:     Conjunctiva/sclera: Conjunctivae normal.  Cardiovascular:     Rate and Rhythm: Normal rate and regular rhythm.  Pulmonary:     Effort: Pulmonary effort is normal.  Abdominal:     General: Abdomen is flat.     Palpations: Abdomen is soft.     Tenderness: There is generalized abdominal tenderness (Lactulose abdominal tenderness without focal point tenderness no guarding or rebound tenderness).  Musculoskeletal:     Cervical back: Neck supple.  Skin:    Findings: No rash.  Neurological:     Mental Status: She is  alert.  Psychiatric:        Mood and Affect: Mood normal.    ED Results / Procedures / Treatments   Labs (all labs ordered are listed, but only abnormal results are displayed) Labs Reviewed  LIPASE, BLOOD - Abnormal; Notable for the following components:      Result Value   Lipase 59 (*)    All other components within normal limits  COMPREHENSIVE METABOLIC PANEL - Abnormal; Notable for the following components:   Sodium 132 (*)    Potassium 3.0 (*)    CO2 21 (*)    Total Bilirubin 1.4 (*)    All other components within normal limits  CBC - Abnormal; Notable for the following components:   WBC 12.6 (*)    RBC 5.35 (*)    MCV 76.1 (*)    All other components within normal limits  URINALYSIS, ROUTINE W REFLEX MICROSCOPIC  I-STAT BETA HCG BLOOD, ED ([facility], [facility], [facility] ONLY)    EKG None  Radiology DG Chest 2 View  Result Date: 12/05/2021 CLINICAL DATA:  Lower chest pain.  Nausea and vomiting. EXAM: CHEST - 2 VIEW COMPARISON:  03/08/2012 FINDINGS: The cardiomediastinal silhouette is within normal limits. The lungs are well inflated and clear. There is no evidence of pleural effusion or pneumothorax. No acute osseous abnormality is identified. IMPRESSION: No active cardiopulmonary disease. Electronically Signed   By: Sebastian Ache M.D.   On: 12/05/2021 10:32   CT ABDOMEN PELVIS W CONTRAST  Result Date: 12/04/2021 CLINICAL DATA:  Epigastric pain.  Elevated lipase EXAM: CT ABDOMEN AND PELVIS WITH CONTRAST TECHNIQUE: Multidetector CT imaging of the abdomen and pelvis was performed using the standard protocol following bolus administration of intravenous contrast. CONTRAST:  80mL OMNIPAQUE IOHEXOL 350 MG/ML SOLN COMPARISON:  10/07/2021 FINDINGS: Lower chest:  No contributory findings. Hepatobiliary: No focal liver abnormality.No evidence of biliary obstruction or stone. Pancreas: Unremarkable. Spleen: Unremarkable. Adrenals/Urinary Tract: Negative adrenals. No hydronephrosis or stone.  Unremarkable bladder. Stomach/Bowel:  No obstruction. No appendicitis. Vascular/Lymphatic: No acute vascular abnormality. No mass or adenopathy. Reproductive:No pathologic findings. Other: No ascites or pneumoperitoneum. Musculoskeletal: No acute abnormalities. IMPRESSION: Negative abdominal CT. Electronically Signed   By: Tiburcio Pea M.D.   On: 12/04/2021 04:30    Procedures Procedures   Medications Ordered in ED Medications  potassium chloride 10 mEq in 100 mL IVPB (0 mEq Intravenous Stopped 12/05/21 1736)  sodium chloride 0.9 % bolus 1,000 mL (0 mLs Intravenous Stopped 12/05/21 1628)  haloperidol lactate (HALDOL) injection 5 mg (5 mg Intravenous Given 12/05/21 1530)  ondansetron (ZOFRAN) injection 4 mg (4 mg Intravenous Given 12/05/21 1530)  diphenhydrAMINE (BENADRYL) injection 50 mg (50 mg Intravenous Given 12/05/21 1553)  dexamethasone (DECADRON) injection 10 mg (10 mg Intravenous Given 12/05/21 1554)    ED Course  I have reviewed the triage vital signs and the nursing notes.  Pertinent labs & imaging results that were available during my care of the patient were reviewed by me and considered in my medical decision making (see chart for details).    MDM Rules/Calculators/A&P                         BP 134/78    Pulse 88    Temp 99.2 F (37.3 C) (Oral)    Resp (!) 22    SpO2 100%      Final Clinical Impression(s) / ED Diagnoses Final diagnoses:  Cannabinoid hyperemesis syndrome  Hypokalemia    Rx / DC Orders ED Discharge Orders     None      3:09 PM Patient here with upper abdominal pain associate nausea and vomiting.  Does have history of marijuana use which I suspect her symptom is  likely cannabinoid hyperemesis syndrome.  Patient was seen in the ED yesterday for same complaint and was treated for gastritis.  She had an abdominal pelvis CT scan that was obtained yesterday and was negative.  She did receive morphine Zofran GI cocktail and Pepcid as well as IV fluid  from this visit.  Her symptoms returned.  We will treat her with Haldol, IV fluid, and antinausea medication.  3:53 PM After receiving 5 mg of Haldol, patient subsequently developed sensation of lock jaw which is likely a dyskinesia side effect of the medication.  No urticaria no tongue swelling no wheezing.  Patient given 50 mg of Benadryl, IV fluid as well as 10 mg of Decadron.  Will monitor closely.  4:07 PM After receiving treatment, patient now feeling much better, dyskinesia resolved.  Will monitor closely.  5:40 PM At this time patient report feeling much better, tolerates p.o., stable for discharge.  I spent time encourage patient to avoid marijuana use as it may contribute to her symptoms.  Encourage patient to eat a banana daily to help replenish potassium and to have it rechecked at her earliest convenience.  Will discharge home with antinausea medication as well.   Fayrene Helper, PA-C 12/05/21 1743    Cathren Laine, MD 12/05/21 [phone]

## 2021-12-05 NOTE — ED Provider Notes (Signed)
Emergency Medicine Provider Triage Evaluation Note  [name] Weber , a 25 y.o. female  was evaluated in triage.  Pt complains of epigastric abdominal pain and nausea/vomiting for the past 4 days. The patient was seen here yesterday with a negative CT scan.. She reports her back is now hurting and she has been vomiting despite the nausea medication.  Review of Systems  Positive: Abdominal pain, nausea, vomiting, back pain Negative: Dysuria, hematuria, fevers  Physical Exam  BP (!) 139/117 (BP Location: Left Arm)    Pulse 97    Temp 99.2 F (37.3 C) (Oral)    Resp 16    SpO2 98%  Gen:   Awake, no distress, uncomfortable Resp:  Normal effort  MSK:   Moves extremities without difficulty  Other:  Abdomen soft, epigastric tenderness. No distention. No guarding or rebound.   Medical Decision Making  Medically screening exam initiated at 9:58 AM.  Appropriate orders placed.  [name] Weber was informed that the remainder of the evaluation will be completed by another provider, this initial triage assessment does not replace that evaluation, and the importance of remaining in the ED until their evaluation is complete.  CT was just performed yesterday and was negative will obtain CXR to r/o pneumo.    Achille Rich, PA-C 12/05/21 1001    Wynetta Fines, MD 12/09/21 [phone]

## 2021-12-05 NOTE — ED Notes (Signed)
Pt called ED RN into room for reaction to medication. ED RN into room to assess pt.  ED PA and ED MD notified via secure chat that pt having allergic reaction.

## 2022-01-21 ENCOUNTER — Ambulatory Visit: Payer: Medicaid Other

## 2022-01-23 ENCOUNTER — Ambulatory Visit (INDEPENDENT_AMBULATORY_CARE_PROVIDER_SITE_OTHER): Payer: Medicaid Other | Admitting: *Deleted

## 2022-01-23 ENCOUNTER — Other Ambulatory Visit: Payer: Self-pay

## 2022-01-23 DIAGNOSIS — Z3042 Encounter for surveillance of injectable contraceptive: Secondary | ICD-10-CM | POA: Diagnosis not present

## 2022-01-23 MED ORDER — MEDROXYPROGESTERONE ACETATE 150 MG/ML IM SUSP
150.0000 mg | Freq: Once | INTRAMUSCULAR | Status: AC
Start: 2022-01-23 — End: 2022-01-23
  Administered 2022-01-23: 150 mg via INTRAMUSCULAR

## 2022-01-23 NOTE — Progress Notes (Signed)
Patient was assessed and managed by nursing staff during this encounter. I have reviewed the chart and agree with the documentation and plan. I have also made any necessary editorial changes.  Catalina Antigua, MD 01/23/2022 2:53 PM

## 2022-01-23 NOTE — Progress Notes (Signed)
Ms. Gabrys presents today for Depo injection.    OBJECTIVE: Appears well, in no apparent distress.   I have reviewed the patient's allergies and medications.   ASSESSMENT: Depo injection given  PLAN Follow up for Depo 4/20-04/24/22  Administrations This Visit     medroxyPROGESTERone (DEPO-PROVERA) injection 150 mg     Admin Date 01/23/2022 Action Given Dose 150 mg Route Intramuscular Administered By Lanney Gins, CMA

## 2022-04-10 ENCOUNTER — Ambulatory Visit: Payer: Medicaid Other

## 2022-04-23 ENCOUNTER — Ambulatory Visit: Payer: Medicaid Other

## 2022-04-23 ENCOUNTER — Ambulatory Visit (INDEPENDENT_AMBULATORY_CARE_PROVIDER_SITE_OTHER): Payer: Medicaid Other | Admitting: Emergency Medicine

## 2022-04-23 VITALS — BP 122/77 | HR 91 | Wt 150.6 lb

## 2022-04-23 DIAGNOSIS — Z3042 Encounter for surveillance of injectable contraceptive: Secondary | ICD-10-CM | POA: Diagnosis not present

## 2022-04-23 MED ORDER — MEDROXYPROGESTERONE ACETATE 150 MG/ML IM SUSP
150.0000 mg | Freq: Once | INTRAMUSCULAR | Status: AC
  Administered 2022-04-23: 150 mg via INTRAMUSCULAR

## 2022-04-23 NOTE — Progress Notes (Signed)
Date last pap: 07/06/2019. ?Last Depo-Provera: 01/23/22. ?Side Effects if any: NA. ?Serum HCG indicated? NA. ?Depo-Provera 150 mg IM given by: Resa Miner, RN into [facility] tolerated well, no adverse effects noted. ?Next appointment due July 19-Aug 2.  ?

## 2022-04-30 ENCOUNTER — Encounter (HOSPITAL_COMMUNITY): Payer: Self-pay | Admitting: Emergency Medicine

## 2022-04-30 ENCOUNTER — Emergency Department (HOSPITAL_COMMUNITY)
Admission: EM | Admit: 2022-04-30 | Discharge: 2022-05-01 | Disposition: A | Discharge status: Home / Self Care | Payer: Medicaid Other | Attending: Emergency Medicine | Admitting: Emergency Medicine

## 2022-04-30 ENCOUNTER — Other Ambulatory Visit: Payer: Self-pay

## 2022-04-30 ENCOUNTER — Emergency Department (HOSPITAL_COMMUNITY): Payer: Medicaid Other

## 2022-04-30 DIAGNOSIS — D72829 Elevated white blood cell count, unspecified: Secondary | ICD-10-CM | POA: Insufficient documentation

## 2022-04-30 DIAGNOSIS — R112 Nausea with vomiting, unspecified: Secondary | ICD-10-CM | POA: Insufficient documentation

## 2022-04-30 DIAGNOSIS — R1013 Epigastric pain: Secondary | ICD-10-CM | POA: Diagnosis not present

## 2022-04-30 LAB — CBC
HCT: 43 % (ref 36.0–46.0)
Hemoglobin: 15.7 g/dL — ABNORMAL HIGH (ref 12.0–15.0)
MCH: 28.1 pg (ref 26.0–34.0)
MCHC: 36.5 g/dL — ABNORMAL HIGH (ref 30.0–36.0)
MCV: 77.1 fL — ABNORMAL LOW (ref 80.0–100.0)
Platelets: 322 10*3/uL (ref 150–400)
RBC: 5.58 MIL/uL — ABNORMAL HIGH (ref 3.87–5.11)
RDW: 13.2 % (ref 11.5–15.5)
WBC: 18.8 10*3/uL — ABNORMAL HIGH (ref 4.0–10.5)
nRBC: 0 % (ref 0.0–0.2)

## 2022-04-30 LAB — RAPID URINE DRUG SCREEN, HOSP PERFORMED
Amphetamines: NOT DETECTED
Barbiturates: NOT DETECTED
Benzodiazepines: NOT DETECTED
Cocaine: NOT DETECTED
Opiates: POSITIVE — AB
Tetrahydrocannabinol: POSITIVE — AB

## 2022-04-30 LAB — COMPREHENSIVE METABOLIC PANEL
ALT: 18 U/L (ref 0–44)
AST: 17 U/L (ref 15–41)
Albumin: 5 g/dL (ref 3.5–5.0)
Alkaline Phosphatase: 39 U/L (ref 38–126)
Anion gap: 13 (ref 5–15)
BUN: 15 mg/dL (ref 6–20)
CO2: 21 mmol/L — ABNORMAL LOW (ref 22–32)
Calcium: 9.6 mg/dL (ref 8.9–10.3)
Chloride: 102 mmol/L (ref 98–111)
Creatinine, Ser: 0.85 mg/dL (ref 0.44–1.00)
GFR, Estimated: >60 mL/min (ref >60)
Glucose, Bld: 141 mg/dL — ABNORMAL HIGH (ref 70–99)
Potassium: 2.9 mmol/L — ABNORMAL LOW (ref 3.5–5.1)
Sodium: 136 mmol/L (ref 135–145)
Total Bilirubin: 1.5 mg/dL — ABNORMAL HIGH (ref 0.3–1.2)
Total Protein: 8.7 g/dL — ABNORMAL HIGH (ref 6.5–8.1)

## 2022-04-30 LAB — I-STAT BETA HCG BLOOD, ED (MC, WL, AP ONLY): I-stat hCG, quantitative: <5 m[IU]/mL (ref <5)

## 2022-04-30 LAB — LIPASE, BLOOD: Lipase: 28 U/L (ref 11–51)

## 2022-04-30 LAB — MAGNESIUM: Magnesium: 2.5 mg/dL — ABNORMAL HIGH (ref 1.7–2.4)

## 2022-04-30 MED ORDER — IOHEXOL 300 MG/ML  SOLN
100.0000 mL | Freq: Once | INTRAMUSCULAR | Status: AC | PRN
  Administered 2022-04-30: 100 mL via INTRAVENOUS

## 2022-04-30 MED ORDER — ONDANSETRON 4 MG PO TBDP
4.0000 mg | ORAL_TABLET | Freq: Once | ORAL | Status: AC | PRN
  Administered 2022-04-30: 4 mg via ORAL
  Filled 2022-04-30: qty 1

## 2022-04-30 MED ORDER — PANTOPRAZOLE SODIUM 40 MG IV SOLR
40.0000 mg | Freq: Once | INTRAVENOUS | Status: AC
  Administered 2022-04-30: 40 mg via INTRAVENOUS
  Filled 2022-04-30: qty 10

## 2022-04-30 MED ORDER — SODIUM CHLORIDE 0.9 % IV SOLN
12.5000 mg | Freq: Four times a day (QID) | INTRAVENOUS | Status: DC | PRN
  Administered 2022-04-30: 12.5 mg via INTRAVENOUS
  Filled 2022-04-30: qty 12.5

## 2022-04-30 MED ORDER — POTASSIUM CHLORIDE CRYS ER 20 MEQ PO TBCR: 20.0000 meq | EXTENDED_RELEASE_TABLET | Freq: Two times a day (BID) | ORAL | 0 refills | Status: DC

## 2022-04-30 MED ORDER — PROMETHAZINE HCL 25 MG PO TABS: 25.0000 mg | ORAL_TABLET | Freq: Four times a day (QID) | ORAL | 0 refills | Status: DC | PRN

## 2022-04-30 MED ORDER — POTASSIUM CHLORIDE 10 MEQ/100ML IV SOLN
10.0000 meq | INTRAVENOUS | Status: AC
  Administered 2022-04-30: 10 meq via INTRAVENOUS
  Filled 2022-04-30: qty 100

## 2022-04-30 MED ORDER — POTASSIUM CHLORIDE CRYS ER 20 MEQ PO TBCR
20.0000 meq | EXTENDED_RELEASE_TABLET | Freq: Once | ORAL | Status: AC
  Administered 2022-04-30: 20 meq via ORAL
  Filled 2022-04-30: qty 1

## 2022-04-30 MED ORDER — SODIUM CHLORIDE 0.9 % IV BOLUS
1000.0000 mL | Freq: Once | INTRAVENOUS | Status: AC
  Administered 2022-04-30: 1000 mL via INTRAVENOUS

## 2022-04-30 MED ORDER — LIDOCAINE VISCOUS HCL 2 % MT SOLN
15.0000 mL | Freq: Once | OROMUCOSAL | Status: AC
Start: 2022-04-30 — End: 2022-04-30
  Administered 2022-04-30: 15 mL via ORAL
  Filled 2022-04-30: qty 15

## 2022-04-30 MED ORDER — MORPHINE SULFATE (PF) 4 MG/ML IV SOLN
4.0000 mg | Freq: Once | INTRAVENOUS | Status: AC
  Administered 2022-04-30: 4 mg via INTRAVENOUS
  Filled 2022-04-30: qty 1

## 2022-04-30 MED ORDER — ALUM & MAG HYDROXIDE-SIMETH 200-200-20 MG/5ML PO SUSP
30.0000 mL | Freq: Once | ORAL | Status: AC
  Administered 2022-04-30: 30 mL via ORAL
  Filled 2022-04-30: qty 30

## 2022-04-30 MED ORDER — POTASSIUM CHLORIDE CRYS ER 20 MEQ PO TBCR
40.0000 meq | EXTENDED_RELEASE_TABLET | Freq: Once | ORAL | Status: AC
  Administered 2022-04-30: 40 meq via ORAL
  Filled 2022-04-30: qty 2

## 2022-04-30 NOTE — Discharge Instructions (Addendum)
You came to the emergency department today to be evaluated for your nausea, vomiting, and abdominal pain.  Your nausea and vomiting may be due to marijuana use.  Please refrain from smoking marijuana in the future. ? ?Your potassium was found to be low while in the emergency department.  Please take the potassium as prescribed and follow-up with your primary care doctor after completion for repeat evaluation. ? ?Have given you prescription for Phenergan you may take 1 pill every 6 hours as needed for nausea and vomiting. ? ?Get help right away if: ?Your pain does not go away as soon as your health care provider told you to expect. ?You cannot stop vomiting. ?Your pain is only in areas of the abdomen, such as the right side or the left lower portion of the abdomen. Pain on the right side could be caused by appendicitis. ?You have bloody or black stools, or stools that look like tar. ?You have severe pain, cramping, or bloating in your abdomen. ?You have signs of dehydration, such as: ?Dark urine, very little urine, or no urine. ?Cracked lips. ?Dry mouth. ?Sunken eyes. ?Sleepiness. ?Weakness. ?You have trouble breathing or chest pain. ?

## 2022-04-30 NOTE — ED Provider Triage Note (Signed)
Emergency Medicine Provider Triage Evaluation Note ? ?[name] Weber , a 26 y.o. female  was evaluated in triage.  Pt complains of nausea, vomiting and upper abdominal pain x 3 days.Prior hx of CHS, she does endorse a an ongoing history of marijuana use.  Did try to do some rectal antiemetics without any improvement in symptoms.  Also endorsing burning sensation to the epigastric region.  Unable to tolerate any fluids or solids. ? ?Review of Systems  ?Positive: Nausea, vomiting, epigastric pain ?Negative: Fever, diarrhea, urinary symptoms ? ?Physical Exam  ?BP (!) 148/118   Pulse 98   Temp 98.2 ?F (36.8 ?C)   Resp 20   SpO2 97%  ?Gen:   Awake, no distress   ?Resp:  Normal effort  ?MSK:   Moves extremities without difficulty  ?Other:   ? ?Medical Decision Making  ?Medically screening exam initiated at 3:25 PM.  Appropriate orders placed.  [name] Weber was informed that the remainder of the evaluation will be completed by another provider, this initial triage assessment does not replace that evaluation, and the importance of remaining in the ED until their evaluation is complete. ? ? ?  ?Claude Manges, PA-C ?04/30/22 1527 ? ?

## 2022-04-30 NOTE — ED Provider Notes (Signed)
?[facility]-EMERGENCY DEPT ?Provider Note ? ? ?CSN: [id number] ?Arrival date & time: 04/30/22  1457 ? ?  ? ?History ? ?Chief Complaint  ?Patient presents with  ? Abdominal Pain  ? Nausea  ? Emesis  ? ? ?[name] Weber is a 26 y.o. female with history of cannabinoid hyperemesis syndrome, alcohol use, sickle cell trait.  Presents to the emergency department with a chief complaint of nausea, vomiting, and epigastric abdominal pain. ? ?Patient reports that symptoms have been present over the last 3 days.  Patient reports vomiting to many times in the last 24 hours to count.  Patient reports that emesis is stomach contents or bilious.  Reports that she used suppository Phenergan yesterday with no relief of her symptoms.  Denies any hematemesis or coffee-ground emesis.  Patient reports that she is having gradually worsening pain to epigastric area.  Pain does not radiate. ? ?Patient endorses regular marijuana use.  Denies any alcohol use.  Patient does not have regular menstrual periods due to Depo-Provera shot. ? ?Denies any fever, chills, blood in stool, melena, diarrhea, constipation, dysuria, hematuria, urinary urgency, vaginal pain, vaginal bleeding, vaginal discharge, lightheadedness, syncope. ? ? ?Abdominal Pain ?Associated symptoms: nausea and vomiting   ?Associated symptoms: no chest pain, no chills, no constipation, no diarrhea, no dysuria, no fever, no hematuria, no shortness of breath, no vaginal bleeding and no vaginal discharge   ?Emesis ?Associated symptoms: abdominal pain   ?Associated symptoms: no chills, no diarrhea, no fever and no headaches   ? ?  ? ?Home Medications ?Prior to Admission medications   ?Medication Sig Start Date End Date Taking? Authorizing Provider  ?famotidine (PEPCID) 20 MG tablet Take 20 mg by mouth 2 (two) times daily. ?Patient not taking: Reported on 04/23/2022    [provider]  ?FLUoxetine (PROZAC) 40 MG capsule Take 40 mg by mouth every morning.  02/17/22   [provider]  ?medroxyPROGESTERone (DEPO-PROVERA) 150 MG/ML injection Inject 1 mL (150 mg total) into the muscle every 3 (three) months. 08/12/21   Milas Hock, MD  ?neomycin-bacitracin-polymyxin (NEOSPORIN) [phone] ointment Apply topically 4 (four) times daily. ?Patient not taking: Reported on 10/31/2021 08/12/21   Milas Hock, MD  ?ondansetron (ZOFRAN-ODT) 4 MG disintegrating tablet Take 1 tablet (4 mg total) by mouth every 8 (eight) hours as needed for nausea or vomiting. 12/04/21   Placido Sou, PA-C  ?Prenatal Vit-Fe Fumarate-FA (PRENATAL MULTIVITAMIN) TABS tablet Take 1 tablet by mouth daily.    [provider]  ?QUEtiapine (SEROQUEL) 100 MG tablet Take 100 mg by mouth at bedtime. 02/17/22   [provider]  ?   ? ?Allergies    ?Haloperidol lactate   ? ?Review of Systems   ?Review of Systems  ?Constitutional:  Negative for chills and fever.  ?Eyes:  Negative for visual disturbance.  ?Respiratory:  Negative for shortness of breath.   ?Cardiovascular:  Negative for chest pain.  ?Gastrointestinal:  Positive for abdominal pain, nausea and vomiting. Negative for abdominal distention, anal bleeding, blood in stool, constipation, diarrhea and rectal pain.  ?Genitourinary:  Negative for decreased urine volume, difficulty urinating, dysuria, flank pain, frequency, genital sores, hematuria, pelvic pain, urgency, vaginal bleeding, vaginal discharge and vaginal pain.  ?Musculoskeletal:  Negative for back pain and neck pain.  ?Skin:  Negative for color change, pallor, rash and wound.  ?Neurological:  Negative for dizziness, syncope, light-headedness and headaches.  ?Psychiatric/Behavioral:  Negative for confusion.   ? ?Physical Exam ?Updated Vital Signs ?BP (!) 132/99 (  BP Location: Left Arm)   Pulse 81   Temp 98.2 ?F (36.8 ?C)   Resp 18   SpO2 95%  ?Physical Exam ?Vitals and nursing note reviewed.  ?Constitutional:   ?   General: She is not in acute distress. ?    Appearance: She is not ill-appearing, toxic-appearing or diaphoretic.  ?   Comments: Appears uncomfortable due to complaints of pain  ?HENT:  ?   Head: Normocephalic.  ?Eyes:  ?   General: No scleral icterus.    ?   Right eye: No discharge.     ?   Left eye: No discharge.  ?Cardiovascular:  ?   Rate and Rhythm: Normal rate.  ?Pulmonary:  ?   Effort: Pulmonary effort is normal.  ?Abdominal:  ?   General: Abdomen is flat. Bowel sounds are normal. There is no distension. There are no signs of injury.  ?   Palpations: Abdomen is soft. There is no mass or pulsatile mass.  ?   Tenderness: There is abdominal tenderness in the epigastric area. There is guarding. There is no right CVA tenderness, left CVA tenderness or rebound.  ?   Hernia: There is no hernia in the umbilical area or ventral area.  ?   Comments: Tenderness to epigastric area with guarding  ?Skin: ?   General: Skin is warm and dry.  ?Neurological:  ?   General: No focal deficit present.  ?   Mental Status: She is alert.  ?Psychiatric:     ?   Behavior: Behavior is cooperative.  ? ? ?ED Results / Procedures / Treatments   ?Labs ?(all labs ordered are listed, but only abnormal results are displayed) ?Labs Reviewed  ?COMPREHENSIVE METABOLIC PANEL - Abnormal; Notable for the following components:  ?    Result Value  ? Potassium 2.9 (*)   ? CO2 21 (*)   ? Glucose, Bld 141 (*)   ? Total Protein 8.7 (*)   ? Total Bilirubin 1.5 (*)   ? All other components within normal limits  ?CBC - Abnormal; Notable for the following components:  ? WBC 18.8 (*)   ? RBC 5.58 (*)   ? Hemoglobin 15.7 (*)   ? MCV 77.1 (*)   ? MCHC 36.5 (*)   ? All other components within normal limits  ?MAGNESIUM - Abnormal; Notable for the following components:  ? Magnesium 2.5 (*)   ? All other components within normal limits  ?LIPASE, BLOOD  ?URINALYSIS, ROUTINE W REFLEX MICROSCOPIC  ?RAPID URINE DRUG SCREEN, HOSP PERFORMED  ?I-STAT BETA HCG BLOOD, ED ([facility], [facility], [facility] ONLY)  ? ? ?EKG ?EKG  Interpretation ? ?Date/Time:  Wednesday Apr 30 2022 20:04:48 EDT ?Ventricular Rate:  84 ?PR Interval:  175 ?QRS Duration: 84 ?QT Interval:  374 ?QTC Calculation: 443 ?R Axis:   87 ?Text Interpretation: Sinus rhythm Borderline T wave abnormalities Confirmed by Alona BeneLong, Joshua [phone]([id number]) on 04/30/2022 8:10:51 PM ? ?Radiology ?CT ABDOMEN PELVIS W CONTRAST ? ?Result Date: 04/30/2022 ?CLINICAL DATA:  Epigastric pain, nausea, vomiting x3 days EXAM: CT ABDOMEN AND PELVIS WITH CONTRAST TECHNIQUE: Multidetector CT imaging of the abdomen and pelvis was performed using the standard protocol following bolus administration of intravenous contrast. RADIATION DOSE REDUCTION: This exam was performed according to the departmental dose-optimization program which includes automated exposure control, adjustment of the mA and/or kV according to patient size and/or use of iterative reconstruction technique. CONTRAST:  100mL OMNIPAQUE IOHEXOL 300 MG/ML  SOLN COMPARISON:  12/04/2021 FINDINGS: Lower chest:  Unremarkable. Hepatobiliary: No focal abnormality is seen in the liver. There is no dilation of bile ducts. Gallbladder is unremarkable. Pancreas: No focal abnormality is seen. Spleen: Unremarkable. Adrenals/Urinary Tract: Adrenals are unremarkable. There is no hydronephrosis. There are no renal or ureteral stones. Urinary bladder is unremarkable. Stomach/Bowel: Stomach is unremarkable. Small bowel loops are not dilated. Appendix is not seen. There is no pericecal inflammation. There is no significant wall thickening in the colon. There is incomplete distention of left colon. There is no pericolic stranding. Vascular/Lymphatic: Unremarkable. Reproductive: Unremarkable. Other: There is no ascites or pneumoperitoneum. Small umbilical hernia containing fat is seen. Musculoskeletal: Unremarkable. IMPRESSION: No acute findings are seen in the CT scan of abdomen and pelvis. There is no evidence of intestinal obstruction or pneumoperitoneum. There is no  hydronephrosis. Electronically Signed   By: Ernie Avena M.D.   On: 04/30/2022 20:45   ? ?Procedures ?Procedures  ? ? ?Medications Ordered in ED ?Medications  ?promethazine (PHENERGAN) 12.5 mg in sodium chloride 0.9 % 50

## 2022-04-30 NOTE — ED Triage Notes (Signed)
Pt reports N/V and upper abd pain x 3 days.  ?

## 2022-05-01 LAB — URINALYSIS, ROUTINE W REFLEX MICROSCOPIC
Bacteria, UA: NONE SEEN
Bilirubin Urine: NEGATIVE
Glucose, UA: NEGATIVE mg/dL
Ketones, ur: 5 mg/dL — AB
Leukocytes,Ua: NEGATIVE
Nitrite: NEGATIVE
Protein, ur: 30 mg/dL — AB
Specific Gravity, Urine: <=1.005 (ref 1.005–1.030)
pH: 7 (ref 5.0–8.0)

## 2022-05-01 NOTE — ED Provider Notes (Signed)
Patient is a 26 year old female whose care was transferred to me at shift change from [facility].  Please see his HPI below: ? ?[name] Weber is a 26 y.o. female with history of cannabinoid hyperemesis syndrome, alcohol use, sickle cell trait.  Presents to the emergency department with a chief complaint of nausea, vomiting, and epigastric abdominal pain. ?  ?Patient reports that symptoms have been present over the last 3 days.  Patient reports vomiting to many times in the last 24 hours to count.  Patient reports that emesis is stomach contents or bilious.  Reports that she used suppository Phenergan yesterday with no relief of her symptoms.  Denies any hematemesis or coffee-ground emesis.  Patient reports that she is having gradually worsening pain to epigastric area.  Pain does not radiate. ?  ?Patient endorses regular marijuana use.  Denies any alcohol use.  Patient does not have regular menstrual periods due to Depo-Provera shot. ?  ?Denies any fever, chills, blood in stool, melena, diarrhea, constipation, dysuria, hematuria, urinary urgency, vaginal pain, vaginal bleeding, vaginal discharge, lightheadedness, syncope. ?  ?  ?Abdominal Pain ?Associated symptoms: nausea and vomiting   ?Associated symptoms: no chest pain, no chills, no constipation, no diarrhea, no dysuria, no fever, no hematuria, no shortness of breath, no vaginal bleeding and no vaginal discharge   ?Emesis ?Associated symptoms: abdominal pain   ?Associated symptoms: no chills, no diarrhea, no fever and no headaches   ?Physical Exam  ?BP 115/70   Pulse 85   Temp 98.2 ?F (36.8 ?C)   Resp 12   SpO2 100%  ? ?Physical Exam ?Vitals and nursing note reviewed.  ?Constitutional:   ?   General: She is not in acute distress. ?   Appearance: She is well-developed.  ?HENT:  ?   Head: Normocephalic and atraumatic.  ?   Right Ear: External ear normal.  ?   Left Ear: External ear normal.  ?Eyes:  ?   General: No scleral icterus.    ?   Right eye:  No discharge.     ?   Left eye: No discharge.  ?   Conjunctiva/sclera: Conjunctivae normal.  ?Neck:  ?   Trachea: No tracheal deviation.  ?Cardiovascular:  ?   Rate and Rhythm: Normal rate.  ?Pulmonary:  ?   Effort: Pulmonary effort is normal. No respiratory distress.  ?   Breath sounds: No stridor.  ?Abdominal:  ?   General: There is no distension.  ?Musculoskeletal:     ?   General: No swelling or deformity.  ?   Cervical back: Neck supple.  ?Skin: ?   General: Skin is warm and dry.  ?   Findings: No rash.  ?Neurological:  ?   Mental Status: She is alert.  ?   Cranial Nerves: Cranial nerve deficit: no gross deficits.  ? ? ?Procedures  ?Procedures ? ?ED Course / MDM  ?  ?Medical Decision Making ?Amount and/or Complexity of Data Reviewed ?Labs: ordered. ?Radiology: ordered. ? ?Risk ?OTC drugs. ?Prescription drug management. ? ?Patient is a 26 year old female whose care was transferred to me at shift change from [facility].  Please see his note for additional information.  In summary, patient presents today due to nausea, vomiting, as well as abdominal pain.  Patient has a history of marijuana use with cyclic vomiting.  Patient's blood work had resulted prior to shift change. ? ?Patient found to be hypokalemic at 2.9 with a magnesium of 2.5.  Hypokalemia was treated with  60 mEq of Klor-Con as well as 10 mEq of IV potassium.  Remaining symptoms were treated with Protonix, IV fluids, GI cocktail, morphine, as well as Zofran.  She reports significant improvement in her symptoms.  At the time of shift change she was pending UDS as well as UA.  These have since resulted.  UA shows 5 ketones as well as 30 protein.  UDS positive for opiates as well as tetrahydrocannabinol.  Given patient's history of marijuana use as well as cyclic vomiting, this could possibly be the cause of her symptoms today.  Previous PA-C obtained a CT scan of the abdomen/pelvis with contrast which also appeared reassuring. ? ?Patient eager to  be discharged.  Feel that this is reasonable.  We discussed return precautions.  She was given prescriptions for Klor-Con as well as Phenergan.  Recommended PCP follow-up.  Her questions were answered and she was amicable at the time of discharge. ? ? ?  ?Placido Sou, PA-C ?05/01/22 0016 ? ?  ?Geoffery Lyons, MD ?05/01/22 [phone] ? ?

## 2022-06-03 IMAGING — CT CT ABD-PELV W/ CM
2 of 4 series · 17 of 46 positions shown, 19 images · IV contrast (agent unspecified)
Comparison: 12/04/2021

CLINICAL DATA: Epigastric pain, nausea, vomiting x3 days

EXAM:
CT ABDOMEN AND PELVIS WITH CONTRAST
TECHNIQUE: Multidetector CT imaging of the abdomen and pelvis was performed
using the standard protocol following bolus administration of
intravenous contrast.

[Series 2: axial st · axial · 0.94mm/px · z∈[-438,-68]mm · 14 of 86 slices shown, 16 images]
[im 6/86  soft-tissue]
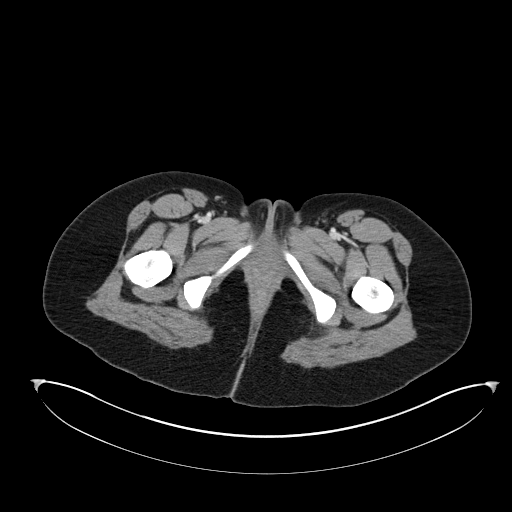
[im 6/86  bone]
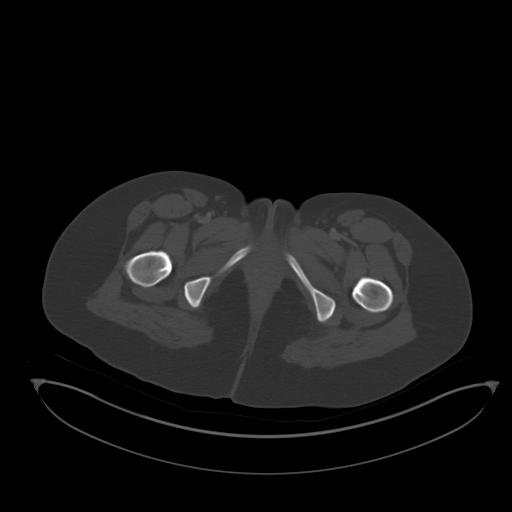
[im 11/86  soft-tissue]
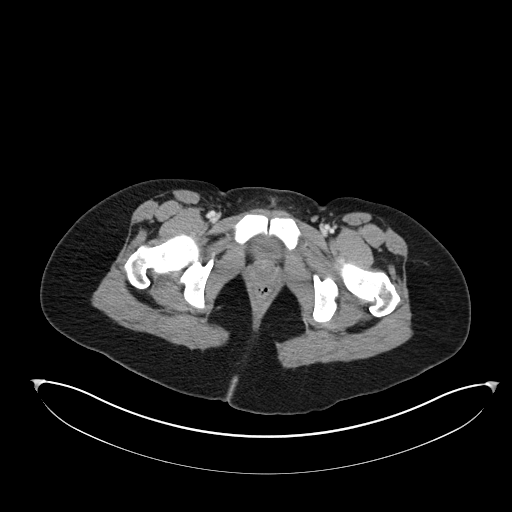
[im 16/86  soft-tissue]
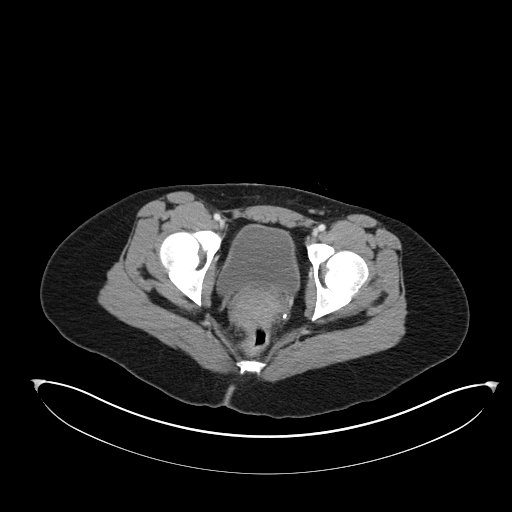
[im 22/86  soft-tissue]
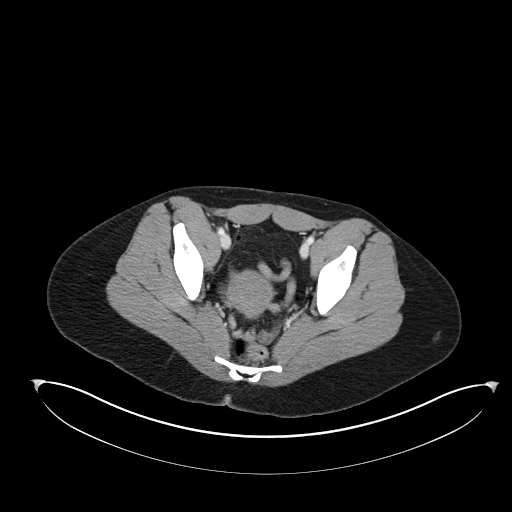
[im 27/86  soft-tissue]
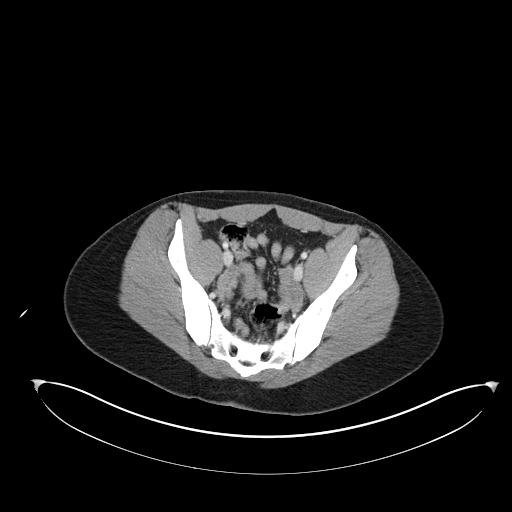
[im 32/86  soft-tissue]
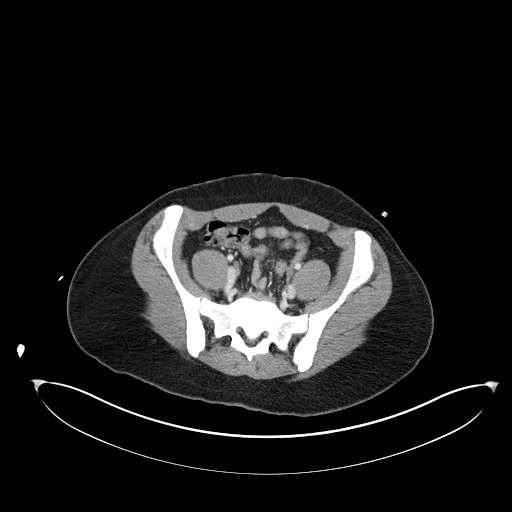
[im 38/86  soft-tissue]
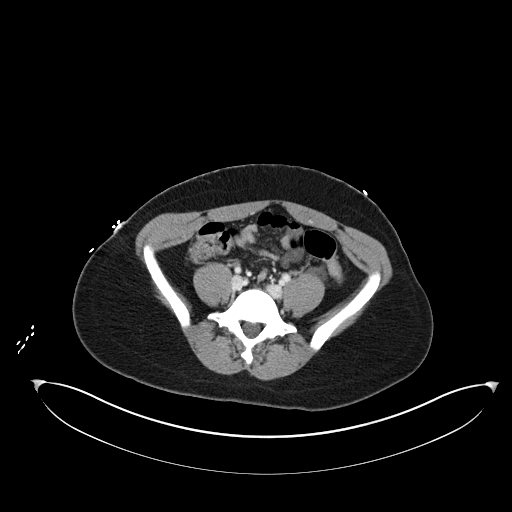
[im 48/86  soft-tissue]
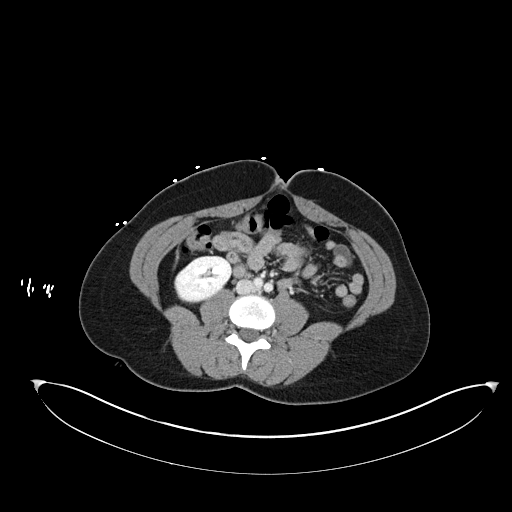
[im 54/86  soft-tissue]
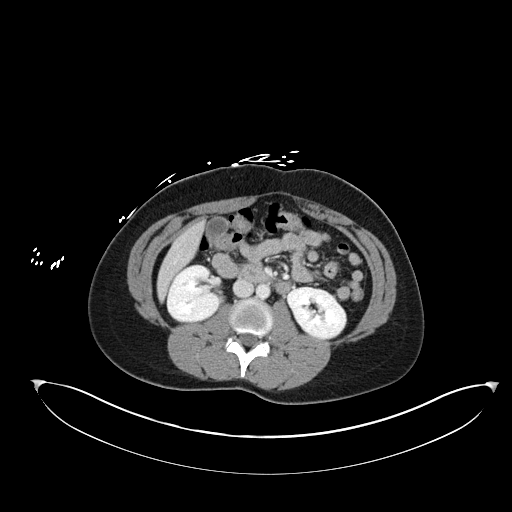
[im 54/86  bone]
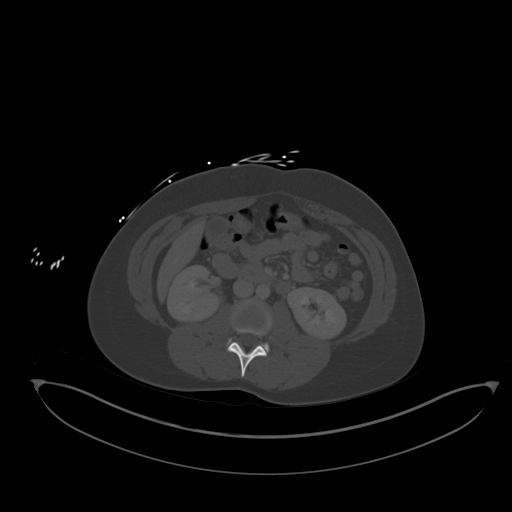
[im 59/86  soft-tissue]
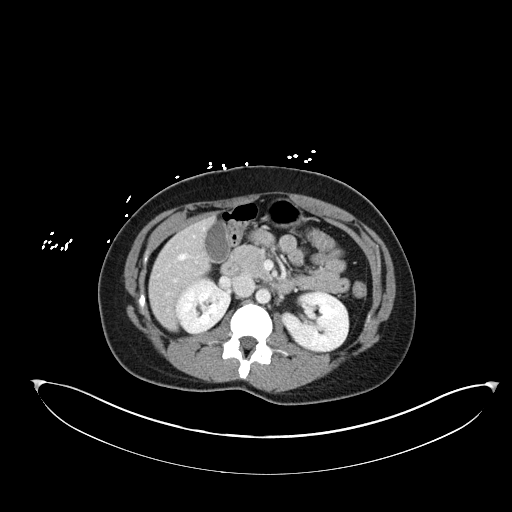
[im 64/86  soft-tissue]
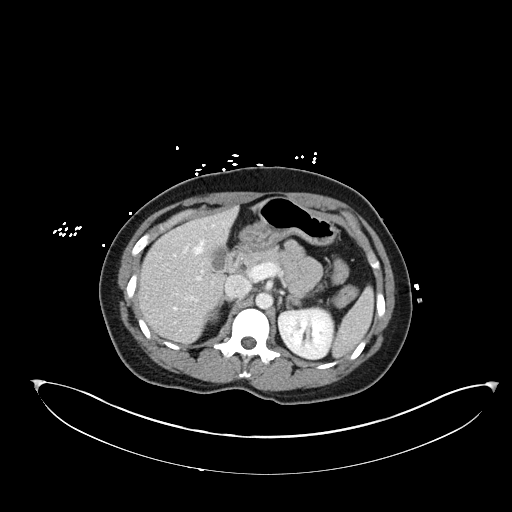
[im 70/86  soft-tissue]
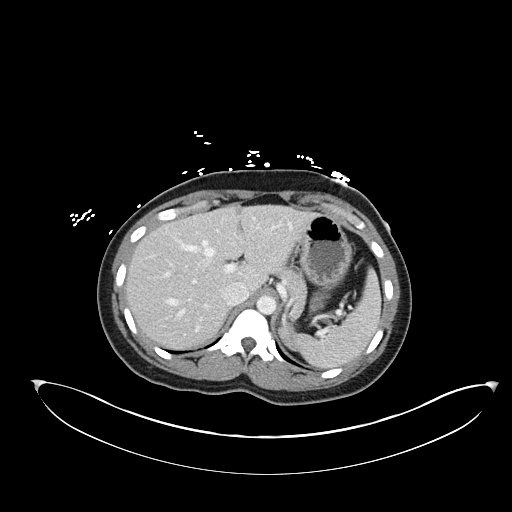
[im 75/86  soft-tissue]
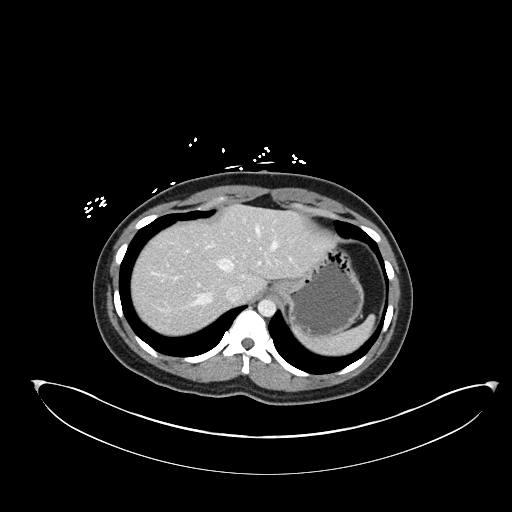
[im 80/86  soft-tissue]
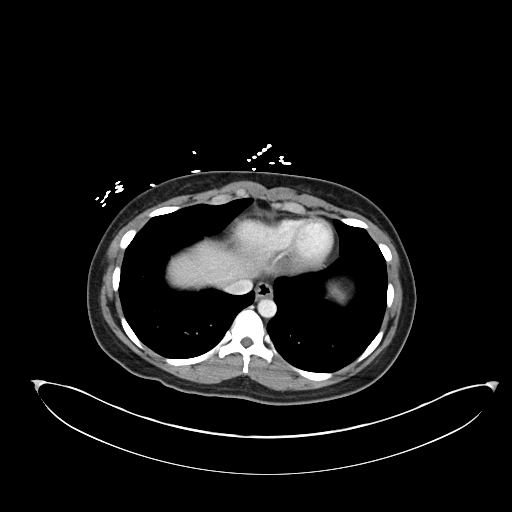

[Series 5: coronal st · coronal · 0.86mm/px · 3 of 145 slices shown]
[im 49/145  soft-tissue]
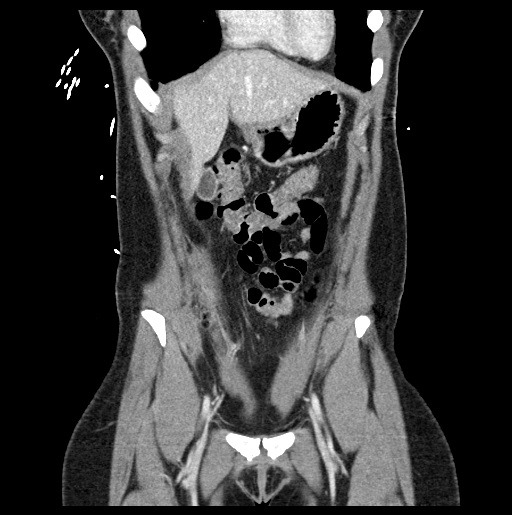
[im 65/145  soft-tissue]
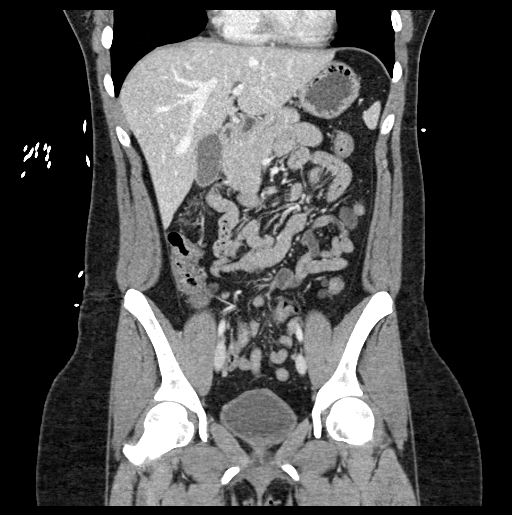
[im 81/145  soft-tissue]
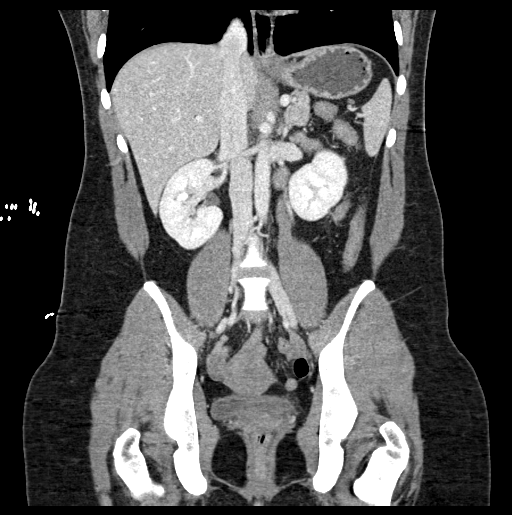

[17 of 46 positions shown; findings below may reference images not displayed]

RADIATION DOSE REDUCTION: This exam was performed according to the
departmental dose-optimization program which includes automated
exposure control, adjustment of the mA and/or kV according to
patient size and/or use of iterative reconstruction technique.

CONTRAST:  100mL OMNIPAQUE IOHEXOL 300 MG/ML  SOLN
FINDINGS: Lower chest: Unremarkable.

Hepatobiliary: No focal abnormality is seen in the liver. There is
no dilation of bile ducts. Gallbladder is unremarkable.

Pancreas: No focal abnormality is seen.

Spleen: Unremarkable.

Adrenals/Urinary Tract: Adrenals are unremarkable. There is no
hydronephrosis. There are no renal or ureteral stones. Urinary
bladder is unremarkable.

Stomach/Bowel: Stomach is unremarkable. Small bowel loops are not
dilated. Appendix is not seen. There is no pericecal inflammation.
There is no significant wall thickening in the colon. There is
incomplete distention of left colon. There is no pericolic
stranding.

Vascular/Lymphatic: Unremarkable.

Reproductive: Unremarkable.

Other: There is no ascites or pneumoperitoneum. Small umbilical
hernia containing fat is seen.

Musculoskeletal: Unremarkable.
IMPRESSION: No acute findings are seen in the CT scan of abdomen and pelvis.
There is no evidence of intestinal obstruction or pneumoperitoneum.
There is no hydronephrosis.

## 2022-07-23 ENCOUNTER — Ambulatory Visit: Payer: Medicaid Other

## 2022-07-29 ENCOUNTER — Ambulatory Visit: Payer: Medicaid Other

## 2022-07-31 ENCOUNTER — Ambulatory Visit (INDEPENDENT_AMBULATORY_CARE_PROVIDER_SITE_OTHER): Payer: Medicaid Other

## 2022-07-31 DIAGNOSIS — Z3042 Encounter for surveillance of injectable contraceptive: Secondary | ICD-10-CM | POA: Diagnosis not present

## 2022-07-31 LAB — POCT URINE PREGNANCY: Preg Test, Ur: NEGATIVE

## 2022-07-31 NOTE — Progress Notes (Signed)
Pt is in the office for Depo Restart, reports unprotected sexual activity outside of depo injection window. UPT is negative in office today Pt will return in 2 weeks for 2nd UPT and depo injection.

## 2022-08-14 ENCOUNTER — Ambulatory Visit: Payer: Medicaid Other

## 2022-08-18 ENCOUNTER — Ambulatory Visit (INDEPENDENT_AMBULATORY_CARE_PROVIDER_SITE_OTHER): Payer: Medicaid Other | Admitting: Emergency Medicine

## 2022-08-18 VITALS — BP 111/75 | HR 85 | Ht 63.0 in | Wt 152.6 lb

## 2022-08-18 DIAGNOSIS — Z3042 Encounter for surveillance of injectable contraceptive: Secondary | ICD-10-CM

## 2022-08-18 LAB — POCT URINE PREGNANCY: Preg Test, Ur: NEGATIVE

## 2022-08-18 MED ORDER — MEDROXYPROGESTERONE ACETATE 150 MG/ML IM SUSP
150.0000 mg | Freq: Once | INTRAMUSCULAR | Status: AC
  Administered 2022-08-18: 150 mg via INTRAMUSCULAR

## 2022-08-18 NOTE — Progress Notes (Signed)
Date last pap: 07/06/2019. Last Depo-Provera: 04/23/2022. Side Effects if any: NA. Serum HCG indicated? Yes- Out of window, NEGATIVE in office today. Depo-Provera 150 mg IM given by: Resa Miner, RN given into LUOQ, tolerated well. Next appointment due Nov 13-27.

## 2022-10-10 ENCOUNTER — Telehealth: Payer: Self-pay

## 2022-10-30 NOTE — Telephone Encounter (Signed)
na

## 2022-11-06 ENCOUNTER — Other Ambulatory Visit: Payer: Self-pay | Admitting: Obstetrics and Gynecology

## 2022-11-06 ENCOUNTER — Ambulatory Visit: Payer: Medicaid Other

## 2022-11-06 DIAGNOSIS — Z01419 Encounter for gynecological examination (general) (routine) without abnormal findings: Secondary | ICD-10-CM

## 2022-11-07 ENCOUNTER — Ambulatory Visit: Payer: Medicaid Other

## 2022-11-20 ENCOUNTER — Ambulatory Visit: Payer: Medicaid Other | Admitting: Obstetrics

## 2022-11-20 ENCOUNTER — Ambulatory Visit (INDEPENDENT_AMBULATORY_CARE_PROVIDER_SITE_OTHER): Payer: Medicaid Other | Admitting: *Deleted

## 2022-11-20 DIAGNOSIS — Z3042 Encounter for surveillance of injectable contraceptive: Secondary | ICD-10-CM

## 2022-11-20 LAB — POCT URINE PREGNANCY: Preg Test, Ur: NEGATIVE

## 2022-11-20 MED ORDER — MEDROXYPROGESTERONE ACETATE 150 MG/ML IM SUSP
150.0000 mg | Freq: Once | INTRAMUSCULAR | Status: AC
  Administered 2022-11-20: 150 mg via INTRAMUSCULAR

## 2022-11-20 NOTE — Progress Notes (Signed)
Pt is in office for Depo injection.  Pt is 3 days past due for Depo.   UPT in office today is Negative. Office supply Depo given per policy in [facility], pt tolerated well.    Next Depo due 2/15-02/20/23 Pt advised to schedule AEX prior to next Depo appt.  Administrations This Visit     medroxyPROGESTERone (DEPO-PROVERA) injection 150 mg     Admin Date 11/20/2022 Action Given Dose 150 mg Route Intramuscular Administered By Lanney Gins, CMA

## 2022-12-26 ENCOUNTER — Ambulatory Visit: Payer: Medicaid Other | Admitting: Gastroenterology

## 2023-01-01 ENCOUNTER — Encounter: Payer: Self-pay | Admitting: Obstetrics

## 2023-01-01 ENCOUNTER — Ambulatory Visit (INDEPENDENT_AMBULATORY_CARE_PROVIDER_SITE_OTHER): Payer: Medicaid Other | Admitting: Obstetrics

## 2023-01-01 ENCOUNTER — Other Ambulatory Visit (HOSPITAL_COMMUNITY)
Admission: RE | Admit: 2023-01-01 | Discharge: 2023-01-01 | Disposition: A | Discharge status: Home / Self Care | Payer: Medicaid Other | Source: Ambulatory Visit | Attending: Obstetrics | Admitting: Obstetrics

## 2023-01-01 VITALS — BP 109/70 | HR 85 | Ht 63.0 in | Wt 149.0 lb

## 2023-01-01 DIAGNOSIS — N898 Other specified noninflammatory disorders of vagina: Secondary | ICD-10-CM | POA: Insufficient documentation

## 2023-01-01 DIAGNOSIS — Z01419 Encounter for gynecological examination (general) (routine) without abnormal findings: Secondary | ICD-10-CM | POA: Diagnosis present

## 2023-01-01 DIAGNOSIS — Z113 Encounter for screening for infections with a predominantly sexual mode of transmission: Secondary | ICD-10-CM

## 2023-01-01 DIAGNOSIS — Z0000 Encounter for general adult medical examination without abnormal findings: Secondary | ICD-10-CM | POA: Diagnosis not present

## 2023-01-01 DIAGNOSIS — Z3042 Encounter for surveillance of injectable contraceptive: Secondary | ICD-10-CM

## 2023-01-01 DIAGNOSIS — F17200 Nicotine dependence, unspecified, uncomplicated: Secondary | ICD-10-CM

## 2023-01-01 MED ORDER — MEDROXYPROGESTERONE ACETATE 150 MG/ML IM SUSP: 150.0000 mg | INTRAMUSCULAR | 4 refills | Status: DC

## 2023-01-01 NOTE — Progress Notes (Signed)
27 y.o GYN presents for AEX/PAP.   Needs Rx refills on Depo.  Next Depo Injection due 02/05/23 - 02/20/23.

## 2023-01-01 NOTE — Progress Notes (Signed)
Subjective:        [name] Weber is a 27 y.o. female here for a routine exam.  Current complaints: Vaginal discharge.    Personal health questionnaire:  Is patient Ashkenazi Jewish, have a family history of breast and/or ovarian cancer: no Is there a family history of uterine cancer diagnosed at age < 69, gastrointestinal cancer, urinary tract cancer, family member who is a Personnel officer syndrome-associated carrier: no Is the patient overweight and hypertensive, family history of diabetes, personal history of gestational diabetes, preeclampsia or PCOS: no Is patient over 21, have PCOS,  family history of premature CHD under age 34, diabetes, smoke, have hypertension or peripheral artery disease:  no At any time, has a partner hit, kicked or otherwise hurt or frightened you?: no Over the past 2 weeks, have you felt down, depressed or hopeless?: no Over the past 2 weeks, have you felt little interest or pleasure in doing things?:no   Gynecologic History No LMP recorded. Patient has had an injection. Contraception: Depo-Provera injections Last Pap: 2020. Results were: normal Last mammogram: n/a. Results were: n/a  Obstetric History OB History  Gravida Para Term Preterm AB Living  5 4 3 1 1 4   SAB IAB Ectopic Multiple Live Births  1 0   0 4    # Outcome Date GA Lbr Len/2nd Weight Sex Delivery Anes PTL Lv  5 Preterm 04/12/21 [redacted]w[redacted]d  4 lb 14.8 oz (2.234 kg) F Vag-Spont None  LIV  4 Term 10/06/14 [redacted]w[redacted]d 12:35 / 00:10 5 lb 2.2 oz (2.33 kg) M Vag-Spont EPI  LIV  3 Term 08/20/13 [redacted]w[redacted]d 01:37 / 00:12 5 lb 13.8 oz (2.66 kg) F Vag-Spont None  LIV  2 SAB 09/2012    U         Birth Comments: Positive pregnancy test October 2013, then next conception in December  1 Term 2012    F Vag-Spont   LIV    Past Medical History:  Diagnosis Date   Chlamydia infection    GDM (gestational diabetes mellitus) 02/2021   Trichomonas 01/2012   UTI (lower urinary tract infection)     Past Surgical History:   Procedure Laterality Date   DRUG INDUCED ENDOSCOPY     NO PAST SURGERIES       Current Outpatient Medications:    medroxyPROGESTERone (DEPO-PROVERA) 150 MG/ML injection, Inject 1 mL (150 mg total) into the muscle every 3 (three) months., Disp: 1 mL, Rfl: 4   ARIPiprazole (ABILIFY) 2 MG tablet, Take 2 mg by mouth daily., Disp: , Rfl:    famotidine (PEPCID) 20 MG tablet, Take 20 mg by mouth 2 (two) times daily. (Patient not taking: Reported on 04/23/2022), Disp: , Rfl:    FLUoxetine (PROZAC) 40 MG capsule, Take 40 mg by mouth every morning., Disp: , Rfl:    hydrOXYzine (ATARAX) 10 MG tablet, Take 10 mg by mouth 3 (three) times daily as needed., Disp: , Rfl:    medroxyPROGESTERone (DEPO-PROVERA) 150 MG/ML injection, Inject 1 mL (150 mg total) into the muscle every 3 (three) months., Disp: 1 mL, Rfl: 4   neomycin-bacitracin-polymyxin (NEOSPORIN) [phone] ointment, Apply topically 4 (four) times daily. (Patient not taking: Reported on 10/31/2021), Disp: 28.3 g, Rfl: 0   ondansetron (ZOFRAN-ODT) 4 MG disintegrating tablet, Take 1 tablet (4 mg total) by mouth every 8 (eight) hours as needed for nausea or vomiting. (Patient not taking: Reported on 07/31/2022), Disp: 8 tablet, Rfl: 0   potassium chloride SA (KLOR-CON M) 20 MEQ  tablet, Take 1 tablet (20 mEq total) by mouth 2 (two) times daily for 5 days., Disp: 10 tablet, Rfl: 0   Prenatal Vit-Fe Fumarate-FA (PRENATAL MULTIVITAMIN) TABS tablet, Take 1 tablet by mouth daily. (Patient not taking: Reported on 07/31/2022), Disp: , Rfl:    promethazine (PHENERGAN) 25 MG tablet, Take 1 tablet (25 mg total) by mouth every 6 (six) hours as needed for nausea or vomiting. (Patient not taking: Reported on 07/31/2022), Disp: 15 tablet, Rfl: 0   QUEtiapine (SEROQUEL) 100 MG tablet, Take 100 mg by mouth at bedtime. (Patient not taking: Reported on 07/31/2022), Disp: , Rfl:  Allergies  Allergen Reactions   Haloperidol Lactate Other (See Comments)    Tetanus (lockjaw)     Social History   Tobacco Use   Smoking status: Every Day    Packs/day: 0.50    Types: Cigarettes    Last attempt to quit: 10/22/2020    Years since quitting: 2.1   Smokeless tobacco: Never   Tobacco comments:    3 cigs/day  Substance Use Topics   Alcohol use: Not Currently    Family History  Problem Relation Age of Onset   Healthy Mother    Healthy Father    Breast cancer Maternal Grandmother 60   Diabetes Maternal Aunt    Alcohol abuse Neg Hx    Arthritis Neg Hx    Asthma Neg Hx    Birth defects Neg Hx    Cancer Neg Hx    COPD Neg Hx    Depression Neg Hx    Drug abuse Neg Hx    Early death Neg Hx    Hearing loss Neg Hx    Heart disease Neg Hx    Hyperlipidemia Neg Hx    Hypertension Neg Hx    Kidney disease Neg Hx    Learning disabilities Neg Hx    Mental illness Neg Hx    Mental retardation Neg Hx    Miscarriages / Stillbirths Neg Hx    Stroke Neg Hx    Vision loss Neg Hx       Review of Systems  Constitutional: negative for fatigue and weight loss Respiratory: negative for cough and wheezing Cardiovascular: negative for chest pain, fatigue and palpitations Gastrointestinal: negative for abdominal pain and change in bowel habits Musculoskeletal:negative for myalgias Neurological: negative for gait problems and tremors Behavioral/Psych: negative for abusive relationship, depression Endocrine: negative for temperature intolerance    Genitourinary: positive for vaginal discharge.  negative for abnormal menstrual periods, genital lesions, hot flashes, sexual problems  Integument/breast: negative for breast lump, breast tenderness, nipple discharge and skin lesion(s)    Objective:       BP 109/70   Pulse 85   Ht 5\' 3"  (1.6 m)   Wt 149 lb (67.6 kg)   BMI 26.39 kg/m  General:   Alert and no distress  Skin:   no rash or abnormalities  Lungs:   clear to auscultation bilaterally  Heart:   regular rate and rhythm, S1, S2 normal, no murmur, click, rub or  gallop  Breasts:   normal without suspicious masses, skin or nipple changes or axillary nodes  Abdomen:  normal findings: no organomegaly, soft, non-tender and no hernia  Pelvis:  External genitalia: normal general appearance Urinary system: urethral meatus normal and bladder without fullness, nontender Vaginal: normal without tenderness, induration or masses Cervix: normal appearance Adnexa: normal bimanual exam Uterus: anteverted and non-tender, normal size   Lab Review Urine pregnancy test Labs reviewed yes  Radiologic studies reviewed no  I have spent a total of 20 minutes of face-to-face time, excluding clinical staff time, reviewing notes and preparing to see patient, ordering tests and/or medications, and counseling the patient.    Assessment:    1. Encounter for gynecological examination with Papanicolaou smear of cervix Rx: - Cytology - PAP( [facility])  2. Vaginal discharge Rx: - Cervicovaginal ancillary only( )  3. Screening examination for STD (sexually transmitted disease) Rx: - HIV antibody (with reflex) - Hepatitis C Antibody - Hepatitis B Surface AntiGEN - RPR  4. Encounter for surveillance of injectable contraceptive Rx: - medroxyPROGESTERone (DEPO-PROVERA) 150 MG/ML injection; Inject 1 mL (150 mg total) into the muscle every 3 (three) months.  Dispense: 1 mL; Refill: 4  5. Tobacco dependence     Plan:    Education reviewed: calcium supplements, depression evaluation, low fat, low cholesterol diet, safe sex/STD prevention, self breast exams, smoking cessation, and weight bearing exercise. Contraception: Depo-Provera injections. Follow up in: 1 year.   Meds ordered this encounter  Medications   medroxyPROGESTERone (DEPO-PROVERA) 150 MG/ML injection    Sig: Inject 1 mL (150 mg total) into the muscle every 3 (three) months.    Dispense:  1 mL    Refill:  4   Orders Placed This Encounter  Procedures   HIV antibody (with reflex)    Hepatitis C Antibody   Hepatitis B Surface AntiGEN   RPR    Amado Andal A. Jodi Mourning MD 01/01/2023

## 2023-01-02 LAB — RPR: RPR Ser Ql: NONREACTIVE

## 2023-01-02 LAB — CERVICOVAGINAL ANCILLARY ONLY
Bacterial Vaginitis (gardnerella): POSITIVE — AB
Candida Glabrata: NEGATIVE
Candida Vaginitis: NEGATIVE
Chlamydia: NEGATIVE
Comment: NEGATIVE
Comment: NEGATIVE
Comment: NEGATIVE
Comment: NEGATIVE
Comment: NEGATIVE
Comment: NORMAL
Neisseria Gonorrhea: NEGATIVE
Trichomonas: NEGATIVE

## 2023-01-02 LAB — HEPATITIS C ANTIBODY: Hep C Virus Ab: NONREACTIVE

## 2023-01-02 LAB — HEPATITIS B SURFACE ANTIGEN: Hepatitis B Surface Ag: NEGATIVE

## 2023-01-02 LAB — HIV ANTIBODY (ROUTINE TESTING W REFLEX): HIV Screen 4th Generation wRfx: NONREACTIVE

## 2023-01-03 ENCOUNTER — Other Ambulatory Visit: Payer: Self-pay | Admitting: Obstetrics

## 2023-01-03 DIAGNOSIS — B9689 Other specified bacterial agents as the cause of diseases classified elsewhere: Secondary | ICD-10-CM

## 2023-01-03 MED ORDER — METRONIDAZOLE 500 MG PO TABS: 500.0000 mg | ORAL_TABLET | Freq: Two times a day (BID) | ORAL | 2 refills | Status: DC

## 2023-01-05 LAB — CYTOLOGY - PAP: Diagnosis: NEGATIVE

## 2023-01-21 ENCOUNTER — Ambulatory Visit (INDEPENDENT_AMBULATORY_CARE_PROVIDER_SITE_OTHER): Payer: Medicaid Other | Admitting: Gastroenterology

## 2023-01-21 ENCOUNTER — Encounter: Payer: Self-pay | Admitting: Gastroenterology

## 2023-01-21 ENCOUNTER — Other Ambulatory Visit (INDEPENDENT_AMBULATORY_CARE_PROVIDER_SITE_OTHER): Payer: Medicaid Other

## 2023-01-21 VITALS — BP 120/68 | HR 102 | Ht 63.0 in | Wt 146.0 lb

## 2023-01-21 DIAGNOSIS — R112 Nausea with vomiting, unspecified: Secondary | ICD-10-CM

## 2023-01-21 DIAGNOSIS — K219 Gastro-esophageal reflux disease without esophagitis: Secondary | ICD-10-CM

## 2023-01-21 DIAGNOSIS — R1013 Epigastric pain: Secondary | ICD-10-CM

## 2023-01-21 LAB — CBC WITH DIFFERENTIAL/PLATELET
Basophils Absolute: 0.1 10*3/uL (ref 0.0–0.1)
Basophils Relative: 0.8 % (ref 0.0–3.0)
Eosinophils Absolute: 0.1 10*3/uL (ref 0.0–0.7)
Eosinophils Relative: 0.8 % (ref 0.0–5.0)
HCT: 41.5 % (ref 36.0–46.0)
Hemoglobin: 14 g/dL (ref 12.0–15.0)
Lymphocytes Relative: 36.6 % (ref 12.0–46.0)
Lymphs Abs: 3.1 10*3/uL (ref 0.7–4.0)
MCHC: 33.8 g/dL (ref 30.0–36.0)
MCV: 83.2 fl (ref 78.0–100.0)
Monocytes Absolute: 0.5 10*3/uL (ref 0.1–1.0)
Monocytes Relative: 6.3 % (ref 3.0–12.0)
Neutro Abs: 4.7 10*3/uL (ref 1.4–7.7)
Neutrophils Relative %: 55.5 % (ref 43.0–77.0)
Platelets: 227 10*3/uL (ref 150.0–400.0)
RBC: 4.99 Mil/uL (ref 3.87–5.11)
RDW: 14.3 % (ref 11.5–15.5)
WBC: 8.5 10*3/uL (ref 4.0–10.5)

## 2023-01-21 LAB — COMPREHENSIVE METABOLIC PANEL
ALT: 11 U/L (ref 0–35)
AST: 11 U/L (ref 0–37)
Albumin: 4.8 g/dL (ref 3.5–5.2)
Alkaline Phosphatase: 35 U/L — ABNORMAL LOW (ref 39–117)
BUN: 10 mg/dL (ref 6–23)
CO2: 26 mEq/L (ref 19–32)
Calcium: 9.3 mg/dL (ref 8.4–10.5)
Chloride: 106 mEq/L (ref 96–112)
Creatinine, Ser: 0.89 mg/dL (ref 0.40–1.20)
GFR: 89.65 mL/min (ref >60.00)
Glucose, Bld: 97 mg/dL (ref 70–99)
Potassium: 3.7 mEq/L (ref 3.5–5.1)
Sodium: 139 mEq/L (ref 135–145)
Total Bilirubin: 0.8 mg/dL (ref 0.2–1.2)
Total Protein: 7.4 g/dL (ref 6.0–8.3)

## 2023-01-21 LAB — LIPASE: Lipase: 13 U/L (ref 11.0–59.0)

## 2023-01-21 MED ORDER — PANTOPRAZOLE SODIUM 40 MG PO TBEC: 40.0000 mg | DELAYED_RELEASE_TABLET | Freq: Every day | ORAL | 3 refills | Status: DC

## 2023-01-21 MED ORDER — ONDANSETRON 4 MG PO TBDP: 4.0000 mg | ORAL_TABLET | Freq: Four times a day (QID) | ORAL | 1 refills | Status: DC | PRN

## 2023-01-21 NOTE — Progress Notes (Signed)
Reviewed and agree with management plans. Consider evaluation for symptomatic gallbladder disease if symptoms continue.   Zeya Balles L. Tarri Glenn, MD, MPH

## 2023-01-21 NOTE — Progress Notes (Signed)
01/21/2023 VEVA GRIMLEY [id number] 03-14-1996   HISTORY OF PRESENT ILLNESS:  This is a 27 year-old female with recurrent complaints of nausea and vomiting.  Has been treated for GERD in the past and report a lot of heartburn/reflux, not currently on medication.  Reports intermittent epigastric abdominal pain.  Says that the nausea and vomiting tends to come and go.  Has not been having any issues recently.  Does have a little bit of epigastric discomfort and says that it hurts to put her bra on when it presses on that area.  Likes to have Zofran ODT on hand if needed.  No longer smoking marijuana.  Interestingly she has had persistently elevated [place] blood cell count over the past year and a half at least.  Had an elevated lipase in the past as well.  Last seen here 05/2021.  She has had 3 CT scans since October 2022, last in May 2023 that of all been unremarkable.  EGD April 2021 showed only a small hiatal hernia.  1. Surgical [P], duodenum - DUODENAL MUCOSA WITH NO SIGNIFICANT PATHOLOGIC FINDINGS. - NEGATIVE FOR INCREASED INTRAEPITHELIAL LYMPHOCYTES AND VILLOUS ARCHITECTURAL CHANGES. 2. Surgical [P], gastric antrum and gastric body - GASTRIC ANTRAL MUCOSA WITH MILD REACTIVE GASTROPATHY. - GASTRIC OXYNTIC MUCOSA WITH MILD CHRONIC GASTRITIS. - WARTHIN-STARRY STAIN IS NEGATIVE FOR HELICOBACTER PYLORI. 3. Surgical [P], distal esophagus - SQUAMOCOLUMNAR ESOPHAGEAL MUCOSA WITH REACTIVE/REGENERATIVE CHANGES. - NEGATIVE FOR INTESTINAL METAPLASIA (GOBLET CELL METAPLASIA). - NEGATIVE FOR INCREASED INTRAEPITHELIAL EOSINOPHILS. 4. Surgical [P], mid/proximal esophagus - SQUAMOUS ESOPHAGEAL EPITHELIUM WITH NO SIGNIFICANT PATHOLOGIC FINDINGS. - NEGATIVE FOR INCREASED INTRAEPITHELIAL EOSINOPHILS.  Past Medical History:  Diagnosis Date   Chlamydia infection    GDM (gestational diabetes mellitus) 02/2021   Trichomonas 01/2012   UTI (lower urinary tract infection)    Past Surgical History:   Procedure Laterality Date   DRUG INDUCED ENDOSCOPY     NO PAST SURGERIES      reports that she has been smoking cigarettes. She has been smoking an average of .5 packs per day. She has never used smokeless tobacco. She reports that she does not currently use alcohol. She reports that she does not currently use drugs after having used the following drugs: Marijuana. family history includes Breast cancer (age of onset: 92) in her maternal grandmother; Diabetes in her maternal aunt; Healthy in her father and mother. Allergies  Allergen Reactions   Haloperidol Lactate Other (See Comments)    Tetanus (lockjaw)      Outpatient Encounter Medications as of 01/21/2023  Medication Sig   famotidine (PEPCID) 20 MG tablet Take 20 mg by mouth 2 (two) times daily.   FLUoxetine (PROZAC) 40 MG capsule Take 40 mg by mouth every morning.   hydrOXYzine (ATARAX) 10 MG tablet Take 10 mg by mouth 3 (three) times daily as needed.   medroxyPROGESTERone (DEPO-PROVERA) 150 MG/ML injection Inject 1 mL (150 mg total) into the muscle every 3 (three) months.   ARIPiprazole (ABILIFY) 2 MG tablet Take 2 mg by mouth daily. (Patient not taking: Reported on 01/21/2023)   medroxyPROGESTERone (DEPO-PROVERA) 150 MG/ML injection Inject 1 mL (150 mg total) into the muscle every 3 (three) months. (Patient not taking: Reported on 01/21/2023)   metroNIDAZOLE (FLAGYL) 500 MG tablet Take 1 tablet (500 mg total) by mouth 2 (two) times daily. (Patient not taking: Reported on 01/21/2023)   neomycin-bacitracin-polymyxin (NEOSPORIN) [phone] ointment Apply topically 4 (four) times daily. (Patient not taking: Reported on 10/31/2021)   ondansetron (ZOFRAN-ODT)  4 MG disintegrating tablet Take 1 tablet (4 mg total) by mouth every 8 (eight) hours as needed for nausea or vomiting. (Patient not taking: Reported on 01/21/2023)   Prenatal Vit-Fe Fumarate-FA (PRENATAL MULTIVITAMIN) TABS tablet Take 1 tablet by mouth daily. (Patient not taking: Reported  on 07/31/2022)   promethazine (PHENERGAN) 25 MG tablet Take 1 tablet (25 mg total) by mouth every 6 (six) hours as needed for nausea or vomiting. (Patient not taking: Reported on 07/31/2022)   QUEtiapine (SEROQUEL) 100 MG tablet Take 100 mg by mouth at bedtime. (Patient not taking: Reported on 07/31/2022)   [DISCONTINUED] potassium chloride SA (KLOR-CON M) 20 MEQ tablet Take 1 tablet (20 mEq total) by mouth 2 (two) times daily for 5 days.   No facility-administered encounter medications on file as of 01/21/2023.     REVIEW OF SYSTEMS  : All other systems reviewed and negative except where noted in the History of Present Illness.   PHYSICAL EXAM: BP 120/68   Pulse (!) 102   Ht 5\' 3"  (1.6 m)   Wt 146 lb (66.2 kg)   SpO2 95%   BMI 25.86 kg/m  General: Well developed female in no acute distress Head: Normocephalic and atraumatic Eyes:  Sclerae anicteric, conjunctiva pink. Ears: Normal auditory acuity Lungs: Clear throughout to auscultation; no W/R/R. Heart: Regular rate and rhythm; no M/R/G. Abdomen: Soft, non-distended.  BS present.  Mild epigastric TTP. Musculoskeletal: Symmetrical with no gross deformities  Skin: No lesions on visible extremities Extremities: No edema  Neurological: Alert oriented x 4, grossly non-focal Psychological:  Alert and cooperative. Normal mood and affect  ASSESSMENT AND PLAN: *27 year-old female with recurrent complaints of nausea and vomiting.  Has been treated for GERD in the past and report a lot of heartburn/reflux, not currently on medication.  Reports intermittent epigastric abdominal pain.  Says that the nausea and vomiting tends to come and go.  Has not been having any issues recently.  Does have a little bit of epigastric discomfort and says that it hurts to put her bra on when it presses on that area.  Likes to have Zofran ODT on hand if needed.  No longer smoking marijuana.  Interestingly she has had persistently elevated [place] blood cell count over  the past year and a half at least.  Will check repeat labs today including a CBC, CMP, and lipase.  If [place] blood cell count still elevated then may need hematology referral.  I am going to put her on pantoprazole 40 mg daily and will refill her Zofran ODT.  Prescriptions sent to pharmacy.   CC:  Duard Larsen, MD

## 2023-01-21 NOTE — Patient Instructions (Addendum)
We have sent the following medications to your pharmacy for you to pick up at your convenience: Pantoprazole  Zofran   Your provider has requested that you go to the basement level for lab work before leaving today. Press "B" on the elevator. The lab is located at the first door on the left as you exit the elevator.   Due to recent changes in healthcare laws, you may see the results of your imaging and laboratory studies on MyChart before your provider has had a chance to review them.  We understand that in some cases there may be results that are confusing or concerning to you. Not all laboratory results come back in the same time frame and the provider may be waiting for multiple results in order to interpret others.  Please give [place] 48 hours in order for your provider to thoroughly review all the results before contacting the office for clarification of your results.    Thank you for choosing [facility]  Janett Billow Zehr,PA-C

## 2023-02-12 ENCOUNTER — Ambulatory Visit: Payer: Self-pay

## 2023-02-16 ENCOUNTER — Ambulatory Visit: Payer: Medicaid Other

## 2023-02-17 ENCOUNTER — Ambulatory Visit (INDEPENDENT_AMBULATORY_CARE_PROVIDER_SITE_OTHER): Payer: Medicaid Other

## 2023-02-17 DIAGNOSIS — Z3042 Encounter for surveillance of injectable contraceptive: Secondary | ICD-10-CM

## 2023-02-17 MED ORDER — MEDROXYPROGESTERONE ACETATE 150 MG/ML IM SUSY
150.0000 mg | PREFILLED_SYRINGE | Freq: Once | INTRAMUSCULAR | Status: AC
  Administered 2023-02-17: 150 mg via INTRAMUSCULAR

## 2023-02-17 NOTE — Progress Notes (Signed)
Date last pap: 01/01/23. Last Depo-Provera: 11/20/22. Side Effects if any: N/A. Serum HCG indicated? N/A. Depo-Provera 150 mg IM given by: Jenetta Downer, B.,RN. Inj given in [facility]. Patient tolerated well. Next appointment due May 15- May 29.

## 2023-05-11 ENCOUNTER — Ambulatory Visit: Payer: Medicaid Other

## 2023-05-20 ENCOUNTER — Ambulatory Visit (INDEPENDENT_AMBULATORY_CARE_PROVIDER_SITE_OTHER): Payer: Medicaid Other

## 2023-05-20 VITALS — BP 125/85 | HR 86 | Ht 63.0 in | Wt 158.0 lb

## 2023-05-20 DIAGNOSIS — Z3042 Encounter for surveillance of injectable contraceptive: Secondary | ICD-10-CM | POA: Diagnosis not present

## 2023-05-20 MED ORDER — MEDROXYPROGESTERONE ACETATE 150 MG/ML IM SUSP
150.0000 mg | Freq: Once | INTRAMUSCULAR | Status: AC
  Administered 2023-05-20: 150 mg via INTRAMUSCULAR

## 2023-05-20 NOTE — Progress Notes (Signed)
  Date last PAP: 01/01/2023. Last Depo-Provera: 02/17/23. Side Effects if any: NONE. Serum HCG indicated? NA.  Depo-Provera 150 mg IM given by: Georgana Curio in [facility], tolerated well  Next appointment due August 14-28, 2024.   Administrations This Visit     medroxyPROGESTERone (DEPO-PROVERA) injection 150 mg     Admin Date 05/20/2023 Action Given Dose 150 mg Route Intramuscular Administered By Maretta Bees, RMA

## 2023-08-13 ENCOUNTER — Ambulatory Visit: Payer: Medicaid Other

## 2024-08-10 ENCOUNTER — Encounter (HOSPITAL_COMMUNITY): Payer: Self-pay

## 2024-08-10 ENCOUNTER — Other Ambulatory Visit: Payer: Self-pay

## 2024-08-10 ENCOUNTER — Observation Stay (HOSPITAL_COMMUNITY)
Admission: EM | Admit: 2024-08-10 | Discharge: 2024-08-11 | Disposition: A | Discharge status: Home / Self Care | Payer: MEDICAID | Attending: Emergency Medicine | Admitting: Emergency Medicine

## 2024-08-10 ENCOUNTER — Emergency Department (HOSPITAL_COMMUNITY): Payer: MEDICAID

## 2024-08-10 DIAGNOSIS — S0592XA Unspecified injury of left eye and orbit, initial encounter: Secondary | ICD-10-CM | POA: Diagnosis present

## 2024-08-10 DIAGNOSIS — H05332 Deformity of left orbit due to trauma or surgery: Secondary | ICD-10-CM | POA: Diagnosis not present

## 2024-08-10 DIAGNOSIS — S0512XA Contusion of eyeball and orbital tissues, left eye, initial encounter: Secondary | ICD-10-CM

## 2024-08-10 DIAGNOSIS — F17210 Nicotine dependence, cigarettes, uncomplicated: Secondary | ICD-10-CM | POA: Insufficient documentation

## 2024-08-10 DIAGNOSIS — S0232XA Fracture of orbital floor, left side, initial encounter for closed fracture: Secondary | ICD-10-CM | POA: Diagnosis not present

## 2024-08-10 LAB — CBC WITH DIFFERENTIAL/PLATELET
Abs Immature Granulocytes: 0.06 K/uL (ref 0.00–0.07)
Basophils Absolute: 0 K/uL (ref 0.0–0.1)
Basophils Relative: 0 %
Eosinophils Absolute: 0 K/uL (ref 0.0–0.5)
Eosinophils Relative: 0 %
HCT: 40.5 % (ref 36.0–46.0)
Hemoglobin: 13.7 g/dL (ref 12.0–15.0)
Immature Granulocytes: 1 %
Lymphocytes Relative: 12 %
Lymphs Abs: 1.5 K/uL (ref 0.7–4.0)
MCH: 26.5 pg (ref 26.0–34.0)
MCHC: 33.8 g/dL (ref 30.0–36.0)
MCV: 78.3 fL — ABNORMAL LOW (ref 80.0–100.0)
Monocytes Absolute: 0.5 K/uL (ref 0.1–1.0)
Monocytes Relative: 4 %
Neutro Abs: 10.7 K/uL — ABNORMAL HIGH (ref 1.7–7.7)
Neutrophils Relative %: 83 %
Platelets: 293 K/uL (ref 150–400)
RBC: 5.17 MIL/uL — ABNORMAL HIGH (ref 3.87–5.11)
RDW: 14 % (ref 11.5–15.5)
WBC: 12.8 K/uL — ABNORMAL HIGH (ref 4.0–10.5)
nRBC: 0 % (ref 0.0–0.2)

## 2024-08-10 LAB — BASIC METABOLIC PANEL WITH GFR
Anion gap: 10 (ref 5–15)
BUN: 9 mg/dL (ref 6–20)
CO2: 21 mmol/L — ABNORMAL LOW (ref 22–32)
Calcium: 8.9 mg/dL (ref 8.9–10.3)
Chloride: 109 mmol/L (ref 98–111)
Creatinine, Ser: 0.77 mg/dL (ref 0.44–1.00)
GFR, Estimated: >60 mL/min (ref >60)
Glucose, Bld: 125 mg/dL — ABNORMAL HIGH (ref 70–99)
Potassium: 3.6 mmol/L (ref 3.5–5.1)
Sodium: 140 mmol/L (ref 135–145)

## 2024-08-10 LAB — HCG, SERUM, QUALITATIVE: Preg, Serum: NEGATIVE

## 2024-08-10 MED ORDER — HYDROMORPHONE HCL 1 MG/ML IJ SOLN
0.5000 mg | Freq: Once | INTRAMUSCULAR | Status: AC
  Administered 2024-08-10: 0.5 mg via INTRAVENOUS
  Filled 2024-08-10: qty 1

## 2024-08-10 MED ORDER — IOHEXOL 300 MG/ML  SOLN
75.0000 mL | Freq: Once | INTRAMUSCULAR | Status: AC | PRN
  Administered 2024-08-10: 75 mL via INTRAVENOUS

## 2024-08-10 MED ORDER — TETRACAINE HCL 0.5 % OP SOLN
2.0000 [drp] | Freq: Once | OPHTHALMIC | Status: AC
  Administered 2024-08-10: 2 [drp] via OPHTHALMIC
  Filled 2024-08-10: qty 4

## 2024-08-10 MED ORDER — TRAZODONE HCL 50 MG PO TABS
25.0000 mg | ORAL_TABLET | Freq: Every evening | ORAL | Status: DC | PRN
Start: 2024-08-10 — End: 2024-08-11
  Administered 2024-08-10: 25 mg via ORAL
  Filled 2024-08-10: qty 1

## 2024-08-10 MED ORDER — HYDROCODONE-ACETAMINOPHEN 5-325 MG PO TABS
2.0000 | ORAL_TABLET | Freq: Once | ORAL | Status: AC
  Administered 2024-08-10: 2 via ORAL
  Filled 2024-08-10: qty 2

## 2024-08-10 MED ORDER — ALBUTEROL SULFATE (2.5 MG/3ML) 0.083% IN NEBU: 2.5000 mg | INHALATION_SOLUTION | RESPIRATORY_TRACT | Status: DC | PRN

## 2024-08-10 MED ORDER — ACETAMINOPHEN 325 MG PO TABS: 650.0000 mg | ORAL_TABLET | Freq: Four times a day (QID) | ORAL | Status: DC | PRN

## 2024-08-10 MED ORDER — ONDANSETRON HCL 4 MG/2ML IJ SOLN
4.0000 mg | Freq: Once | INTRAMUSCULAR | Status: AC
Start: 2024-08-10 — End: 2024-08-10
  Administered 2024-08-10: 4 mg via INTRAVENOUS
  Filled 2024-08-10: qty 2

## 2024-08-10 MED ORDER — HYDROMORPHONE HCL 1 MG/ML IJ SOLN
0.5000 mg | INTRAMUSCULAR | Status: DC | PRN
  Administered 2024-08-10 – 2024-08-11 (×2): 1 mg via INTRAVENOUS
  Filled 2024-08-10 (×2): qty 1

## 2024-08-10 MED ORDER — ONDANSETRON HCL 4 MG/2ML IJ SOLN
4.0000 mg | Freq: Once | INTRAMUSCULAR | Status: DC
  Filled 2024-08-10: qty 2

## 2024-08-10 MED ORDER — OXYCODONE HCL 5 MG PO TABS: 5.0000 mg | ORAL_TABLET | ORAL | Status: DC | PRN

## 2024-08-10 MED ORDER — PREDNISONE 50 MG PO TABS
50.0000 mg | ORAL_TABLET | Freq: Every day | ORAL | Status: DC
  Administered 2024-08-11: 50 mg via ORAL
  Filled 2024-08-10: qty 1

## 2024-08-10 MED ORDER — ONDANSETRON HCL 4 MG PO TABS: 4.0000 mg | ORAL_TABLET | Freq: Four times a day (QID) | ORAL | Status: DC | PRN

## 2024-08-10 MED ORDER — PNEUMOCOCCAL 20-VAL CONJ VACC 0.5 ML IM SUSY
0.5000 mL | PREFILLED_SYRINGE | INTRAMUSCULAR | Status: AC | PRN
Start: 2024-08-10 — End: 2024-08-11
  Administered 2024-08-11: 0.5 mL via INTRAMUSCULAR
  Filled 2024-08-10: qty 0.5

## 2024-08-10 MED ORDER — PREDNISONE 20 MG PO TABS
60.0000 mg | ORAL_TABLET | Freq: Once | ORAL | Status: AC
  Administered 2024-08-10: 60 mg via ORAL
  Filled 2024-08-10: qty 3

## 2024-08-10 MED ORDER — ONDANSETRON HCL 4 MG/2ML IJ SOLN: 4.0000 mg | Freq: Four times a day (QID) | INTRAMUSCULAR | Status: DC | PRN

## 2024-08-10 MED ORDER — ACETAMINOPHEN 650 MG RE SUPP
650.0000 mg | Freq: Four times a day (QID) | RECTAL | Status: DC | PRN
Start: 2024-08-10 — End: 2024-08-11
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
## 2024-08-10 NOTE — ED Notes (Signed)
Ice pack applied to left eye.

## 2024-08-10 NOTE — Plan of Care (Signed)
   Problem: Education: Goal: Knowledge of General Education information will improve Description Including pain rating scale, medication(s)/side effects and non-pharmacologic comfort measures Outcome: Progressing   Problem: Health Behavior/Discharge Planning: Goal: Ability to manage health-related needs will improve Outcome: Progressing
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
## 2024-08-11 DIAGNOSIS — S0592XA Unspecified injury of left eye and orbit, initial encounter: Secondary | ICD-10-CM | POA: Diagnosis not present

## 2024-08-11 LAB — HIV ANTIBODY (ROUTINE TESTING W REFLEX): HIV Screen 4th Generation wRfx: NONREACTIVE

## 2024-08-11 LAB — BASIC METABOLIC PANEL WITH GFR
Anion gap: 8 (ref 5–15)
BUN: 7 mg/dL (ref 6–20)
CO2: 22 mmol/L (ref 22–32)
Calcium: 9.1 mg/dL (ref 8.9–10.3)
Chloride: 104 mmol/L (ref 98–111)
Creatinine, Ser: 0.67 mg/dL (ref 0.44–1.00)
GFR, Estimated: >60 mL/min (ref >60)
Glucose, Bld: 126 mg/dL — ABNORMAL HIGH (ref 70–99)
Potassium: 3.8 mmol/L (ref 3.5–5.1)
Sodium: 134 mmol/L — ABNORMAL LOW (ref 135–145)

## 2024-08-11 LAB — CBC
HCT: 39.2 % (ref 36.0–46.0)
Hemoglobin: 13.3 g/dL (ref 12.0–15.0)
MCH: 26.7 pg (ref 26.0–34.0)
MCHC: 33.9 g/dL (ref 30.0–36.0)
MCV: 78.7 fL — ABNORMAL LOW (ref 80.0–100.0)
Platelets: 301 K/uL (ref 150–400)
RBC: 4.98 MIL/uL (ref 3.87–5.11)
RDW: 14.1 % (ref 11.5–15.5)
WBC: 11.9 K/uL — ABNORMAL HIGH (ref 4.0–10.5)
nRBC: 0 % (ref 0.0–0.2)

## 2024-08-11 MED ORDER — OXYCODONE HCL 5 MG PO TABS: 5.0000 mg | ORAL_TABLET | Freq: Three times a day (TID) | ORAL | 0 refills | Status: AC | PRN

## 2024-08-11 MED ORDER — PANTOPRAZOLE SODIUM 40 MG PO TBEC
40.0000 mg | DELAYED_RELEASE_TABLET | Freq: Every day | ORAL | Status: DC
  Administered 2024-08-11: 40 mg via ORAL
  Filled 2024-08-11: qty 1

## 2024-08-11 MED ORDER — PREDNISONE 20 MG PO TABS: ORAL_TABLET | ORAL | 0 refills | Status: AC

## 2024-08-11 MED ORDER — PANTOPRAZOLE SODIUM 40 MG PO TBEC: 40.0000 mg | DELAYED_RELEASE_TABLET | Freq: Every day | ORAL | 0 refills | Status: AC
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
## 2024-08-11 NOTE — TOC Initial Note (Signed)
 Transition of Care [facility]) - Initial/Assessment Note    Patient Details  Name: [name] Weber MRN: [id number] Date of Birth: [birth year]  Transition of Care [facility] CM/SW Contact:    Sonda Manuella Quill, RN Phone Number: 08/11/2024, 10:21 AM  Clinical Narrative:                 Spoke w/ pt in room; spoke w/ pt and SO Darin Ill [phone]) in room; pt says she lives at home w/ her children; she plans to return at d/c; he will provide transportation; insurance/PCP verified; pt says she has difficulty paying for her utilities; she denied other SDOH risks; pt does not have DME, HH services, or home oxygen; she agreed to receive resources for social services and financial assistance; resources placed in d/c instructions; pt also given copies of resources; she will make her own appt at agencies of choice; no TOC needs.  Expected Discharge Plan: Home/Self Care Barriers to Discharge: No Barriers Identified   Patient Goals and CMS Choice Patient states their goals for this hospitalization and ongoing recovery are:: home          Expected Discharge Plan and Services   Discharge Planning Services: CM Consult   Living arrangements for the past 2 months: Apartment Expected Discharge Date: 08/11/24               DME Arranged: N/A DME Agency: NA       HH Arranged: NA HH Agency: NA        Prior Living Arrangements/Services Living arrangements for the past 2 months: Apartment Lives with:: Minor Children Patient language and need for interpreter reviewed:: Yes Do you feel safe going back to the place where you live?: Yes      Need for Family Participation in Patient Care: Yes (Comment) Care giver support system in place?: Yes (comment) Current home services:  (n/a) Criminal Activity/Legal Involvement Pertinent to Current Situation/Hospitalization: No - Comment as needed  Activities of Daily Living   ADL Screening (condition at time of admission) Independently performs  ADLs?: Yes (appropriate for developmental age) Is the patient deaf or have difficulty hearing?: No Does the patient have difficulty seeing, even when wearing glasses/contacts?: Yes Does the patient have difficulty concentrating, remembering, or making decisions?: No  Permission Sought/Granted Permission sought to share information with : Case Manager Permission granted to share information with : Yes, Verbal Permission Granted  Share Information with NAME: Case Manager     Permission granted to share info w Relationship: Darin Ill (SO) [phone]     Emotional Assessment Appearance:: Appears stated age Attitude/Demeanor/Rapport: Gracious Affect (typically observed): Accepting Orientation: : Oriented to Self, Oriented to Place, Oriented to  Time, Oriented to Situation Alcohol / Substance Use: Not Applicable Psych Involvement: No (comment)  Admission diagnosis:  Traumatic orbital hematoma, left, initial encounter [S05.12XA] Injury of globe of eye, left, initial encounter [S05.92XA] Closed fracture of left orbital floor, initial encounter [facility]) [S02.32XA] Patient Active Problem List   Diagnosis Date Noted   Injury of globe of eye, left, initial encounter 08/10/2024   Abdominal pain, epigastric 01/21/2023   Gastroesophageal reflux disease 01/21/2023   Elevated lipase 06/19/2021   Gestational diabetes 03/07/2021   Alpha thalassemia silent carrier 10/29/2020   Former tobacco use 10/18/2020   Leukocytosis 10/04/2020   Cannabinoid hyperemesis syndrome 11/22/2018   Alcohol abuse 11/22/2018   Nausea and vomiting 11/21/2018   Hypokalemia 11/21/2018   Sickle cell trait (HCC) 07/03/2014   PCP:  Tonnie Raisin, MD  Pharmacy:   [id number] [address] [place], [place] - [address] AT [facility] [address] [place] [address] Phone: [phone] Fax: [phone]     Social Drivers of Health (SDOH) Social History: SDOH Screenings    Food Insecurity: No Food Insecurity (08/11/2024)  Housing: Low Risk  (08/11/2024)  Transportation Needs: No Transportation Needs (08/11/2024)  Utilities: At Risk (08/11/2024)  Depression (PHQ2-9): Low Risk  (02/17/2023)  Tobacco Use: High Risk (08/10/2024)   SDOH Interventions: Food Insecurity Interventions: Intervention Not Indicated, Inpatient TOC Housing Interventions: Intervention Not Indicated, Inpatient TOC Transportation Interventions: Intervention Not Indicated, Inpatient TOC Utilities Interventions: [employer] Provided, Inpatient TOC   Readmission Risk Interventions     No data to display

## 2024-09-16 ENCOUNTER — Ambulatory Visit
Admission: EM | Admit: 2024-09-16 | Discharge: 2024-09-16 | Disposition: A | Discharge status: Home / Self Care | Payer: MEDICAID | Attending: Urgent Care | Admitting: Urgent Care

## 2024-09-16 DIAGNOSIS — L03012 Cellulitis of left finger: Secondary | ICD-10-CM

## 2024-09-16 MED ORDER — IBUPROFEN 800 MG PO TABS: 800.0000 mg | ORAL_TABLET | Freq: Three times a day (TID) | ORAL | 0 refills | Status: AC

## 2024-09-16 MED ORDER — AMOXICILLIN-POT CLAVULANATE 875-125 MG PO TABS: 1.0000 | ORAL_TABLET | Freq: Two times a day (BID) | ORAL | 0 refills | Status: AC

## 2024-09-16 NOTE — Discharge Instructions (Signed)
Please change your dressing 3-5 times daily. Do not apply any ointments or creams. Each time you change your dressing, make sure that you are pressing on the wound to get pus to come out.  Try your best to clean the wound with antibacterial soap and warm water. Pat your wound dry and let it air out if possible to make sure it is dry before reapplying another dressing.   Start doxycycline for the infection. Use ibuprofen for the pain.   

## 2024-09-16 NOTE — ED Provider Notes (Signed)
 [facility]  Note:  This document was prepared using Conservation officer, historic buildings and may include unintentional dictation errors.  MRN: [id number] DOB: 1996/09/10  Subjective:   [name] Weber is a 28 y.o. female presenting for 2-day history of acute onset persistent left thumb pain, swelling, redness.  Patient reports that she tried to bite off the nails, hangnails.   No current facility-administered medications for this encounter.  Current Outpatient Medications:    pantoprazole  (PROTONIX ) 40 MG tablet, Take 1 tablet (40 mg total) by mouth daily for 14 days., Disp: 14 tablet, Rfl: 0   sulindac (CLINORIL) 200 MG tablet, Take 200 mg by mouth 2 (two) times daily as needed (for pain or inflammation)., Disp: , Rfl:    Allergies  Allergen Reactions   Haloperidol  Lactate Other (See Comments)    Tetanus (lockjaw)    Past Medical History:  Diagnosis Date   Chlamydia infection    GDM (gestational diabetes mellitus) 02/2021   Trichomonas 01/2012   UTI (lower urinary tract infection)      Past Surgical History:  Procedure Laterality Date   DRUG INDUCED ENDOSCOPY     NO PAST SURGERIES      Family History  Problem Relation Age of Onset   Healthy Mother    Healthy Father    Breast cancer Maternal Grandmother 32   Diabetes Maternal Aunt    Alcohol abuse Neg Hx    Arthritis Neg Hx    Asthma Neg Hx    Birth defects Neg Hx    Cancer Neg Hx    COPD Neg Hx    Depression Neg Hx    Drug abuse Neg Hx    Early death Neg Hx    Hearing loss Neg Hx    Heart disease Neg Hx    Hyperlipidemia Neg Hx    Hypertension Neg Hx    Kidney disease Neg Hx    Learning disabilities Neg Hx    Mental illness Neg Hx    Mental retardation Neg Hx    Miscarriages / Stillbirths Neg Hx    Stroke Neg Hx    Vision loss Neg Hx     Social History   Tobacco Use   Smoking status: Every Day    Current packs/day: 0.00    Types: Cigarettes    Last attempt to quit:  10/22/2020    Years since quitting: 3.9   Smokeless tobacco: Never   Tobacco comments:    3 cigs/day  Vaping Use   Vaping status: Never Used  Substance Use Topics   Alcohol use: Not Currently   Drug use: Not Currently    Types: Marijuana    ROS   Objective:   Vitals: BP 117/77 (BP Location: Left Arm)   Pulse 85   Temp 98.5 F (36.9 C) (Oral)   Resp 18   LMP  (Within Weeks) Comment: 1 week  SpO2 97%   Physical Exam Constitutional:      General: She is not in acute distress.    Appearance: Normal appearance. She is well-developed. She is not ill-appearing, toxic-appearing or diaphoretic.  HENT:     Head: Normocephalic and atraumatic.     Nose: Nose normal.     Mouth/Throat:     Mouth: Mucous membranes are moist.  Eyes:     General: No scleral icterus.       Right eye: No discharge.        Left eye: No discharge.  Extraocular Movements: Extraocular movements intact.  Cardiovascular:     Rate and Rhythm: Normal rate.  Pulmonary:     Effort: Pulmonary effort is normal.  Musculoskeletal:       Hands:  Skin:    General: Skin is warm and dry.  Neurological:     General: No focal deficit present.     Mental Status: She is alert and oriented to person, place, and time.  Psychiatric:        Mood and Affect: Mood normal.        Behavior: Behavior normal.    PROCEDURE NOTE: Paronychia I&D Verbal consent obtained. Local anesthesia with 0/5cc of 1 % lidocaine  without epinephrine. Site cleansed with Betadine and alcohol swabs.  Paronychia expressed using an 11 blade and Adson forcep, discharge 1cc mixture of purulence, serosanguineous fluid. Cleansed and dressed.   Assessment and Plan :   PDMP not reviewed this encounter.  1. Acute paronychia of left thumb    Successful I&D performed.  Wound care reviewed.  Start Augmentin  for the paronychia given that she bites her nails and fingers, ibuprofen  for pain and inflammation. Counseled patient on potential for adverse  effects with medications prescribed/recommended today, ER and return-to-clinic precautions discussed, patient verbalized understanding.    Christopher Savannah, [place] 09/16/24 [id number]

## 2024-09-16 NOTE — ED Triage Notes (Signed)
 Pt reports pain and swelling in the left thumb x 2 days. Reports she bites her nails.

## 2024-12-20 ENCOUNTER — Other Ambulatory Visit: Payer: Self-pay

## 2024-12-20 ENCOUNTER — Emergency Department (HOSPITAL_COMMUNITY): Payer: MEDICAID

## 2024-12-20 ENCOUNTER — Emergency Department (HOSPITAL_COMMUNITY)
Admission: EM | Admit: 2024-12-20 | Discharge: 2024-12-21 | Discharge status: Against Medical Advice | Payer: MEDICAID | Attending: Emergency Medicine | Admitting: Emergency Medicine

## 2024-12-20 DIAGNOSIS — R1084 Generalized abdominal pain: Secondary | ICD-10-CM | POA: Diagnosis present

## 2024-12-20 DIAGNOSIS — R112 Nausea with vomiting, unspecified: Secondary | ICD-10-CM | POA: Diagnosis not present

## 2024-12-20 DIAGNOSIS — Z5321 Procedure and treatment not carried out due to patient leaving prior to being seen by health care provider: Secondary | ICD-10-CM | POA: Diagnosis not present

## 2024-12-20 DIAGNOSIS — R197 Diarrhea, unspecified: Secondary | ICD-10-CM | POA: Diagnosis not present

## 2024-12-20 LAB — CBC
HCT: 37.6 % (ref 36.0–46.0)
Hemoglobin: 13.8 g/dL (ref 12.0–15.0)
MCH: 28.9 pg (ref 26.0–34.0)
MCHC: 36.7 g/dL — ABNORMAL HIGH (ref 30.0–36.0)
MCV: 78.8 fL — ABNORMAL LOW (ref 80.0–100.0)
Platelets: 271 K/uL (ref 150–400)
RBC: 4.77 MIL/uL (ref 3.87–5.11)
RDW: 13.6 % (ref 11.5–15.5)
WBC: 16.3 K/uL — ABNORMAL HIGH (ref 4.0–10.5)
nRBC: 0 % (ref 0.0–0.2)

## 2024-12-20 LAB — COMPREHENSIVE METABOLIC PANEL WITH GFR
ALT: 17 U/L (ref 0–44)
AST: 18 U/L (ref 15–41)
Albumin: 4.7 g/dL (ref 3.5–5.0)
Alkaline Phosphatase: 60 U/L (ref 38–126)
Anion gap: 12 (ref 5–15)
BUN: 8 mg/dL (ref 6–20)
CO2: 21 mmol/L — ABNORMAL LOW (ref 22–32)
Calcium: 9.3 mg/dL (ref 8.9–10.3)
Chloride: 104 mmol/L (ref 98–111)
Creatinine, Ser: 0.67 mg/dL (ref 0.44–1.00)
GFR, Estimated: >60 mL/min
Glucose, Bld: 136 mg/dL — ABNORMAL HIGH (ref 70–99)
Potassium: 3.9 mmol/L (ref 3.5–5.1)
Sodium: 137 mmol/L (ref 135–145)
Total Bilirubin: 0.6 mg/dL (ref 0.0–1.2)
Total Protein: 7.7 g/dL (ref 6.5–8.1)

## 2024-12-20 LAB — LIPASE, BLOOD: Lipase: 24 U/L (ref 11–51)

## 2024-12-20 LAB — HCG, SERUM, QUALITATIVE: Preg, Serum: NEGATIVE

## 2024-12-20 MED ORDER — IOHEXOL 350 MG/ML SOLN
75.0000 mL | Freq: Once | INTRAVENOUS | Status: AC | PRN
  Administered 2024-12-20: 75 mL via INTRAVENOUS

## 2024-12-20 MED ORDER — DIPHENHYDRAMINE HCL 50 MG/ML IJ SOLN
25.0000 mg | Freq: Once | INTRAMUSCULAR | Status: AC
  Administered 2024-12-20: 25 mg via INTRAVENOUS
  Filled 2024-12-20: qty 1

## 2024-12-20 MED ORDER — SODIUM CHLORIDE 0.9 % IV BOLUS
1000.0000 mL | Freq: Once | INTRAVENOUS | Status: AC
  Administered 2024-12-20: 1000 mL via INTRAVENOUS

## 2024-12-20 MED ORDER — ONDANSETRON HCL 4 MG/2ML IJ SOLN
4.0000 mg | Freq: Once | INTRAMUSCULAR | Status: AC
  Administered 2024-12-20: 4 mg via INTRAVENOUS
  Filled 2024-12-20: qty 2

## 2024-12-20 NOTE — ED Triage Notes (Signed)
 Pt arrives via EMS from home. Pt c/o abdominal pain, nausea, vomiting, and diarrhea since this morning. Pt was given 4mg  of zofran  and 300ml of LR by ems.

## 2024-12-20 NOTE — ED Provider Triage Note (Signed)
 Emergency Medicine Provider Triage Evaluation Note  [name] Weber , a 28 y.o. female  was evaluated in triage.  Pt complains of generalized abdominal pain, nausea, vomiting diarrhea.  Patient indicates that pain is in her epigastric and suprapubic region.  States her symptoms started this morning upon waking.  Her infant had similar symptoms over Christmas.  Denies fevers.  Uses marijuana intermittently but states she has not had any in the last 3 weeks.  Patient received 4 mg of IV Zofran  and route with EMS.  Frequent emesis still current on arrival.  Review of Systems  Positive: N/a Negative: N/a  Physical Exam  BP 138/87   Pulse 71   Temp 98.3 F (36.8 C)   Resp 17   SpO2 99%  Gen:   Awake, no distress    Resp:  Normal effort   Abdomen: Epigastric and suprapubic tenderness on exam.  No guarding or rigidity.  Soft. MSK:   Moves extremities without difficulty   Other:     Medical Decision Making  Medically screening exam initiated at 7:28 PM.  Appropriate orders placed.  [name] Weber was informed that the remainder of the evaluation will be completed by another provider, this initial triage assessment does not replace that evaluation, and the importance of remaining in the ED until their evaluation is complete.     [name] Bernarda SQUIBB, DO 12/20/24 1929

## 2024-12-21 NOTE — ED Notes (Signed)
Pt stated she was leaving.  

## 2025-04-13 ENCOUNTER — Ambulatory Visit: Payer: MEDICAID | Admitting: Dermatology
